# Patient Record
Sex: Female | Born: 1970 | Race: White | Hispanic: No | Smoking: Current every day smoker
Health system: Southern US, Community
[De-identification: ages and names within clinical notes are randomized; demographics above are authoritative.]

## PROBLEM LIST (undated history)

## (undated) ENCOUNTER — Ambulatory Visit

## (undated) ENCOUNTER — Encounter
Attending: Student in an Organized Health Care Education/Training Program | Primary: Student in an Organized Health Care Education/Training Program

## (undated) ENCOUNTER — Encounter

## (undated) ENCOUNTER — Telehealth

## (undated) ENCOUNTER — Ambulatory Visit: Payer: Medicare (Managed Care)

## (undated) ENCOUNTER — Ambulatory Visit: Attending: Pharmacist | Primary: Pharmacist

## (undated) ENCOUNTER — Emergency Department (HOSPITAL_COMMUNITY): Admission: EM | Payer: Medicaid Other | Source: Home / Self Care

## (undated) DIAGNOSIS — F32A Depression, unspecified: Secondary | ICD-10-CM

## (undated) DIAGNOSIS — R112 Nausea with vomiting, unspecified: Secondary | ICD-10-CM

## (undated) DIAGNOSIS — M549 Dorsalgia, unspecified: Secondary | ICD-10-CM

## (undated) DIAGNOSIS — Z9889 Other specified postprocedural states: Secondary | ICD-10-CM

## (undated) DIAGNOSIS — F419 Anxiety disorder, unspecified: Secondary | ICD-10-CM

## (undated) DIAGNOSIS — F329 Major depressive disorder, single episode, unspecified: Secondary | ICD-10-CM

## (undated) HISTORY — DX: Depression, unspecified: F32.A

## (undated) HISTORY — DX: Major depressive disorder, single episode, unspecified: F32.9

## (undated) HISTORY — DX: Anxiety disorder, unspecified: F41.9

## (undated) HISTORY — PX: BLADDER SURGERY: SHX569

## (undated) HISTORY — PX: OTHER SURGICAL HISTORY: SHX169

## (undated) HISTORY — PX: ABDOMINAL HYSTERECTOMY: SHX81

## (undated) HISTORY — DX: Dorsalgia, unspecified: M54.9

---

## 1997-10-24 ENCOUNTER — Inpatient Hospital Stay (HOSPITAL_COMMUNITY): Admission: AD | Admit: 1997-10-24 | Discharge: 1997-10-24 | Payer: Self-pay | Admitting: Obstetrics

## 1998-03-25 ENCOUNTER — Inpatient Hospital Stay (HOSPITAL_COMMUNITY): Admission: AD | Admit: 1998-03-25 | Discharge: 1998-03-25 | Payer: Self-pay | Admitting: Obstetrics

## 1998-03-26 ENCOUNTER — Inpatient Hospital Stay (HOSPITAL_COMMUNITY): Admission: RE | Admit: 1998-03-26 | Discharge: 1998-03-26 | Payer: Self-pay | Admitting: Obstetrics

## 1998-03-29 ENCOUNTER — Encounter: Payer: Self-pay | Admitting: Obstetrics

## 1998-03-29 ENCOUNTER — Ambulatory Visit (HOSPITAL_COMMUNITY): Admission: RE | Admit: 1998-03-29 | Discharge: 1998-03-29 | Payer: Self-pay | Admitting: Obstetrics

## 1998-04-01 ENCOUNTER — Ambulatory Visit (HOSPITAL_COMMUNITY): Admission: RE | Admit: 1998-04-01 | Discharge: 1998-04-01 | Payer: Self-pay | Admitting: Obstetrics

## 1998-08-25 ENCOUNTER — Inpatient Hospital Stay (HOSPITAL_COMMUNITY): Admission: AD | Admit: 1998-08-25 | Discharge: 1998-08-25 | Payer: Self-pay | Admitting: Obstetrics

## 1998-09-05 ENCOUNTER — Ambulatory Visit (HOSPITAL_COMMUNITY): Admission: RE | Admit: 1998-09-05 | Discharge: 1998-09-05 | Payer: Self-pay | Admitting: Obstetrics

## 1998-10-14 ENCOUNTER — Inpatient Hospital Stay (HOSPITAL_COMMUNITY): Admission: AD | Admit: 1998-10-14 | Discharge: 1998-10-14 | Payer: Self-pay | Admitting: Obstetrics

## 1998-10-24 ENCOUNTER — Inpatient Hospital Stay (HOSPITAL_COMMUNITY): Admission: AD | Admit: 1998-10-24 | Discharge: 1998-10-24 | Payer: Self-pay | Admitting: Obstetrics

## 1998-11-12 ENCOUNTER — Encounter (HOSPITAL_COMMUNITY): Admission: RE | Admit: 1998-11-12 | Discharge: 1998-11-25 | Payer: Self-pay | Admitting: Obstetrics

## 1998-11-23 ENCOUNTER — Inpatient Hospital Stay (HOSPITAL_COMMUNITY): Admission: AD | Admit: 1998-11-23 | Discharge: 1998-11-26 | Payer: Self-pay | Admitting: Obstetrics

## 2000-03-10 ENCOUNTER — Inpatient Hospital Stay (HOSPITAL_COMMUNITY): Admission: EM | Admit: 2000-03-10 | Discharge: 2000-03-10 | Payer: Self-pay | Admitting: Obstetrics

## 2000-03-18 ENCOUNTER — Other Ambulatory Visit: Admission: RE | Admit: 2000-03-18 | Discharge: 2000-03-18 | Payer: Self-pay | Admitting: Obstetrics & Gynecology

## 2000-05-07 ENCOUNTER — Other Ambulatory Visit: Admission: RE | Admit: 2000-05-07 | Discharge: 2000-05-07 | Payer: Self-pay | Admitting: Obstetrics & Gynecology

## 2000-05-07 ENCOUNTER — Encounter (INDEPENDENT_AMBULATORY_CARE_PROVIDER_SITE_OTHER): Payer: Self-pay

## 2000-07-05 ENCOUNTER — Ambulatory Visit (HOSPITAL_COMMUNITY): Admission: RE | Admit: 2000-07-05 | Discharge: 2000-07-05 | Payer: Self-pay | Admitting: *Deleted

## 2000-07-05 ENCOUNTER — Encounter (INDEPENDENT_AMBULATORY_CARE_PROVIDER_SITE_OTHER): Payer: Self-pay | Admitting: Specialist

## 2001-02-02 ENCOUNTER — Inpatient Hospital Stay (HOSPITAL_COMMUNITY): Admission: AD | Admit: 2001-02-02 | Discharge: 2001-02-02 | Payer: Self-pay | Admitting: *Deleted

## 2001-04-12 ENCOUNTER — Emergency Department (HOSPITAL_COMMUNITY): Admission: EM | Admit: 2001-04-12 | Discharge: 2001-04-12 | Payer: Self-pay

## 2001-04-12 ENCOUNTER — Encounter: Payer: Self-pay | Admitting: Emergency Medicine

## 2001-06-09 ENCOUNTER — Inpatient Hospital Stay (HOSPITAL_COMMUNITY): Admission: AD | Admit: 2001-06-09 | Discharge: 2001-06-09 | Payer: Self-pay | Admitting: Obstetrics & Gynecology

## 2001-08-07 ENCOUNTER — Inpatient Hospital Stay (HOSPITAL_COMMUNITY): Admission: AD | Admit: 2001-08-07 | Discharge: 2001-08-07 | Payer: Self-pay | Admitting: Obstetrics & Gynecology

## 2001-08-25 ENCOUNTER — Encounter: Admission: RE | Admit: 2001-08-25 | Discharge: 2001-08-25 | Payer: Self-pay | Admitting: Obstetrics

## 2001-09-30 ENCOUNTER — Encounter: Admission: RE | Admit: 2001-09-30 | Discharge: 2001-09-30 | Payer: Self-pay | Admitting: *Deleted

## 2002-05-29 ENCOUNTER — Inpatient Hospital Stay (HOSPITAL_COMMUNITY): Admission: AD | Admit: 2002-05-29 | Discharge: 2002-05-29 | Payer: Self-pay | Admitting: Obstetrics and Gynecology

## 2002-11-07 ENCOUNTER — Ambulatory Visit (HOSPITAL_COMMUNITY): Admission: RE | Admit: 2002-11-07 | Discharge: 2002-11-07 | Payer: Self-pay | Admitting: Nephrology

## 2002-11-07 ENCOUNTER — Encounter: Payer: Self-pay | Admitting: Nephrology

## 2002-12-17 ENCOUNTER — Emergency Department (HOSPITAL_COMMUNITY): Admission: EM | Admit: 2002-12-17 | Discharge: 2002-12-17 | Payer: Self-pay | Admitting: Emergency Medicine

## 2003-10-12 ENCOUNTER — Inpatient Hospital Stay (HOSPITAL_COMMUNITY): Admission: AD | Admit: 2003-10-12 | Discharge: 2003-10-12 | Payer: Self-pay | Admitting: *Deleted

## 2004-08-13 ENCOUNTER — Encounter (INDEPENDENT_AMBULATORY_CARE_PROVIDER_SITE_OTHER): Payer: Self-pay | Admitting: *Deleted

## 2004-08-13 ENCOUNTER — Ambulatory Visit (HOSPITAL_COMMUNITY): Admission: RE | Admit: 2004-08-13 | Discharge: 2004-08-13 | Payer: Self-pay | Admitting: Obstetrics

## 2004-08-29 ENCOUNTER — Encounter: Admission: RE | Admit: 2004-08-29 | Discharge: 2004-10-01 | Payer: Self-pay | Admitting: Orthopedic Surgery

## 2004-10-21 ENCOUNTER — Emergency Department (HOSPITAL_COMMUNITY): Admission: EM | Admit: 2004-10-21 | Discharge: 2004-10-21 | Payer: Self-pay | Admitting: Emergency Medicine

## 2005-02-16 ENCOUNTER — Emergency Department (HOSPITAL_COMMUNITY): Admission: EM | Admit: 2005-02-16 | Discharge: 2005-02-17 | Payer: Self-pay | Admitting: Emergency Medicine

## 2005-05-03 ENCOUNTER — Inpatient Hospital Stay (HOSPITAL_COMMUNITY): Admission: AD | Admit: 2005-05-03 | Discharge: 2005-05-04 | Payer: Self-pay | Admitting: Obstetrics

## 2005-06-24 ENCOUNTER — Inpatient Hospital Stay (HOSPITAL_COMMUNITY): Admission: AD | Admit: 2005-06-24 | Discharge: 2005-06-24 | Payer: Self-pay | Admitting: Obstetrics & Gynecology

## 2005-11-05 ENCOUNTER — Emergency Department (HOSPITAL_COMMUNITY): Admission: EM | Admit: 2005-11-05 | Discharge: 2005-11-05 | Payer: Self-pay | Admitting: *Deleted

## 2005-11-19 ENCOUNTER — Emergency Department (HOSPITAL_COMMUNITY): Admission: EM | Admit: 2005-11-19 | Discharge: 2005-11-19 | Payer: Self-pay | Admitting: Family Medicine

## 2006-01-03 ENCOUNTER — Emergency Department (HOSPITAL_COMMUNITY): Admission: EM | Admit: 2006-01-03 | Discharge: 2006-01-03 | Payer: Self-pay | Admitting: Emergency Medicine

## 2006-01-14 ENCOUNTER — Encounter: Admission: RE | Admit: 2006-01-14 | Discharge: 2006-01-14 | Payer: Self-pay | Admitting: Nephrology

## 2006-04-19 ENCOUNTER — Emergency Department (HOSPITAL_COMMUNITY): Admission: EM | Admit: 2006-04-19 | Discharge: 2006-04-19 | Payer: Self-pay | Admitting: Emergency Medicine

## 2006-04-24 ENCOUNTER — Emergency Department (HOSPITAL_COMMUNITY): Admission: EM | Admit: 2006-04-24 | Discharge: 2006-04-24 | Payer: Self-pay | Admitting: Family Medicine

## 2006-08-17 ENCOUNTER — Inpatient Hospital Stay (HOSPITAL_COMMUNITY): Admission: AD | Admit: 2006-08-17 | Discharge: 2006-08-18 | Payer: Self-pay | Admitting: Obstetrics

## 2006-12-13 ENCOUNTER — Emergency Department (HOSPITAL_COMMUNITY): Admission: EM | Admit: 2006-12-13 | Discharge: 2006-12-13 | Payer: Self-pay | Admitting: Emergency Medicine

## 2007-02-04 ENCOUNTER — Emergency Department (HOSPITAL_COMMUNITY): Admission: EM | Admit: 2007-02-04 | Discharge: 2007-02-04 | Payer: Self-pay | Admitting: Emergency Medicine

## 2007-03-07 ENCOUNTER — Emergency Department (HOSPITAL_COMMUNITY): Admission: EM | Admit: 2007-03-07 | Discharge: 2007-03-07 | Payer: Self-pay | Admitting: Emergency Medicine

## 2007-05-06 ENCOUNTER — Inpatient Hospital Stay (HOSPITAL_COMMUNITY): Admission: AD | Admit: 2007-05-06 | Discharge: 2007-05-06 | Payer: Self-pay | Admitting: Gynecology

## 2007-06-03 ENCOUNTER — Emergency Department (HOSPITAL_COMMUNITY): Admission: EM | Admit: 2007-06-03 | Discharge: 2007-06-03 | Payer: Self-pay | Admitting: Emergency Medicine

## 2007-08-04 HISTORY — PX: HAND SURGERY: SHX662

## 2007-08-07 ENCOUNTER — Inpatient Hospital Stay (HOSPITAL_COMMUNITY): Admission: AD | Admit: 2007-08-07 | Discharge: 2007-08-07 | Payer: Self-pay | Admitting: Obstetrics and Gynecology

## 2007-12-19 ENCOUNTER — Emergency Department (HOSPITAL_COMMUNITY): Admission: EM | Admit: 2007-12-19 | Discharge: 2007-12-19 | Payer: Self-pay | Admitting: Emergency Medicine

## 2008-12-14 ENCOUNTER — Emergency Department (HOSPITAL_COMMUNITY): Admission: EM | Admit: 2008-12-14 | Discharge: 2008-12-14 | Payer: Self-pay | Admitting: Emergency Medicine

## 2009-02-26 ENCOUNTER — Emergency Department (HOSPITAL_COMMUNITY): Admission: EM | Admit: 2009-02-26 | Discharge: 2009-02-26 | Payer: Self-pay | Admitting: Emergency Medicine

## 2009-05-10 ENCOUNTER — Emergency Department (HOSPITAL_BASED_OUTPATIENT_CLINIC_OR_DEPARTMENT_OTHER): Admission: EM | Admit: 2009-05-10 | Discharge: 2009-05-10 | Payer: Self-pay | Admitting: Emergency Medicine

## 2009-05-10 ENCOUNTER — Ambulatory Visit: Payer: Self-pay | Admitting: Diagnostic Radiology

## 2010-04-04 ENCOUNTER — Emergency Department (HOSPITAL_COMMUNITY): Admission: EM | Admit: 2010-04-04 | Discharge: 2010-04-04 | Payer: Self-pay | Admitting: Family Medicine

## 2010-04-16 ENCOUNTER — Ambulatory Visit: Payer: Self-pay | Admitting: Gynecology

## 2010-04-22 ENCOUNTER — Ambulatory Visit: Payer: Self-pay | Admitting: Gynecology

## 2010-07-11 ENCOUNTER — Ambulatory Visit: Payer: Self-pay | Admitting: Gynecology

## 2010-07-17 ENCOUNTER — Ambulatory Visit: Payer: Self-pay | Admitting: Gynecology

## 2010-07-17 ENCOUNTER — Ambulatory Visit
Admission: RE | Admit: 2010-07-17 | Discharge: 2010-07-18 | Payer: Self-pay | Source: Home / Self Care | Attending: Gynecology | Admitting: Gynecology

## 2010-08-04 ENCOUNTER — Ambulatory Visit: Payer: Self-pay | Admitting: Gynecology

## 2010-08-19 ENCOUNTER — Ambulatory Visit
Admission: RE | Admit: 2010-08-19 | Discharge: 2010-08-19 | Payer: Self-pay | Source: Home / Self Care | Attending: Gynecology | Admitting: Gynecology

## 2010-10-13 ENCOUNTER — Emergency Department (HOSPITAL_COMMUNITY)
Admission: EM | Admit: 2010-10-13 | Discharge: 2010-10-13 | Disposition: A | Discharge status: Home / Self Care | Payer: Medicaid Other | Attending: Emergency Medicine | Admitting: Emergency Medicine

## 2010-10-13 DIAGNOSIS — R51 Headache: Secondary | ICD-10-CM | POA: Insufficient documentation

## 2010-10-13 DIAGNOSIS — H538 Other visual disturbances: Secondary | ICD-10-CM | POA: Insufficient documentation

## 2010-10-13 DIAGNOSIS — R42 Dizziness and giddiness: Secondary | ICD-10-CM | POA: Insufficient documentation

## 2010-10-13 LAB — APTT: aPTT: 29 seconds (ref 24–37)

## 2010-10-13 LAB — ABO/RH: ABO/RH(D): A POS

## 2010-10-13 LAB — DIFFERENTIAL
Basophils Absolute: 0 10*3/uL (ref 0.0–0.1)
Basophils Relative: 0 % (ref 0–1)
Lymphocytes Relative: 24 % (ref 12–46)
Neutro Abs: 8.6 10*3/uL — ABNORMAL HIGH (ref 1.7–7.7)
Neutrophils Relative %: 66 % (ref 43–77)

## 2010-10-13 LAB — CBC
Platelets: 186 10*3/uL (ref 150–400)
RDW: 12.6 % (ref 11.5–15.5)
WBC: 13 10*3/uL — ABNORMAL HIGH (ref 4.0–10.5)

## 2010-10-13 LAB — PROTIME-INR
INR: 0.97 (ref 0.00–1.49)
Prothrombin Time: 13.1 seconds (ref 11.6–15.2)

## 2010-10-16 LAB — WET PREP, GENITAL
Clue Cells Wet Prep HPF POC: NONE SEEN
Yeast Wet Prep HPF POC: NONE SEEN

## 2010-10-16 LAB — POCT URINALYSIS DIPSTICK
Protein, ur: NEGATIVE mg/dL
Urobilinogen, UA: 1 mg/dL (ref 0.0–1.0)
pH: 6 (ref 5.0–8.0)

## 2010-10-16 LAB — GC/CHLAMYDIA PROBE AMP, GENITAL

## 2010-12-19 NOTE — Op Note (Signed)
[facility]  Patient:    Bailey Mcdaniel, DATE                     MRN: [id number] Proc. Date: 07/05/00 Adm. Date:  [id number] Attending:  Pleas Koch                           Operative Report  PREOPERATIVE DIAGNOSES:       1. Chronic right lower quadrant pain.                               2. Desires elective sterilization.                               3. Cervical dysplasia.  POSTOPERATIVE DIAGNOSES:      1. Chronic right lower quadrant pain.                               2. Desires elective sterilization.                               3. Cervical dysplasia.                               4. Endometriosis, minimal, of right and left                                  ovary.  OPERATION:                    Loop electrosurgical excision procedure (LEEP),                               Bilateral tubal ligation by cautery,                               fulguration of right and left ovarian                               endometriosis.  SURGEON:                      Georgina Peer, M.D.  ASSISTANT:  ANESTHESIA:                   General anesthesia per Ellison Hughs., M.D.  ESTIMATED BLOOD LOSS:         Less than 25 cc.  COMPLICATIONS:                None.  FINDINGS:                     Posterior cervical lip lesion as previously seen on colposcopy in the office.  Minimal ovarian endometriosis bilaterally.  No evidence of adhesions or other pathology.  INDICATIONS:                  This is a 40 year old gravida 3, para 3 with chronic right lower quadrant pain, negative cultures, normal pelvic examination except for tenderness.  She had an abnormal Pap smear and colposcopy showed mild cervical dysplasia on the posterior lip of the cervix. She comes in for treatment and also elects sterilization.  She was aware of the risks and complications of LEEP procedure and also laparoscopy, and also the one to two  percent lifetime failure rate of tubal ligation. She was willing to proceed.  DESCRIPTION OF PROCEDURE:     The patient was taken to the operating room and given a general anesthetic, placed in the dorsal lithotomy position with the legs in Alan stirrups.  Abdomen and perineum were prepped and draped sterilely.  Bladder was emptied with a catheter.  Examination under anesthesia revealed normal mid position uterus with no adnexal masses.  Speculum was placed which was especially coated for LEEP procedure.  Cervix was bathed with acetic acid, injected with a total of 7 cc of 0.5% Xylocaine with epinephrine 1:100,000.  The cervix blanched slightly.  Lugol iodine was applied in a posterior lesion as previously noted in the office was seen.  A white handled LEEP electrode was used to excise the posterior lip and anterior lip which were then appropriately marked on a cork board and sent to pathology.  Any bleeders were cauterized with red ball coagulation.  A Hulka uterine manipulator was then placed after Monsels was applied to the LEEP crator.  A vertical subumbilical incision was then made. Verres needle placed and 3.5 liters of carbon dioxide gas insufflated creating a pneumoperitoneum. Laparoscopic trocar and sleeve were introduced and under direct vision a 5 mm trocar was introduced.  The following pelvic findings were noted.  There appeared to be no injury from laparoscopic trocar placements.  There was no evidence of tubo-ovarian adhesions. The tubes appeared normal.  The appendix appeared normal. The cul-de-sac appeared normal.  There was evidence of powder burn spots on the ovary surfaces bilaterally with no adhesions. The ovarian fossae bilaterally appeared normal. The ureters appeared normal.  The bladder flap appeared normal.  The upper abdomen appeared normal.  Photo documentation of this was accomplished. Using bipolar cautery the tubes bilaterally were grasped in their mid  portion, cauterized until bubbling, blanching and no current passed the tubes along the 2 cm portions.  Again, this was done bilaterally. Cauterization with bipolar current was also accomplished to fulgurate the surface endometriosis on the ovaries bilaterally. There was no bleeding.  Irrigation removed any blood and debris.  The procedure was then terminated with the ports removed.  Marcaine, a total of 3 cc, placed in the skin incisions.  The incisions were then closed with subcuticular Dexon. The vaginal instruments were then removed.  Sponge, needle and instrument counts were correct.  The patient received IV Toradol and was awakened and sent to the recovery room in stable condition. DD:  07/05/00 TD:  07/05/00 Job: [id number]

## 2010-12-19 NOTE — Op Note (Signed)
Bailey Mcdaniel, Bailey Mcdaniel              ACCOUNT NO.:  [id number]   MEDICAL RECORD NO.:  [id number]          PATIENT TYPE:  AMB   LOCATION:  SDC                           FACILITY:  [facility]   PHYSICIAN:  Kathreen Cosier, M.D.DATE OF BIRTH:  Sep 20, 1970   DATE OF PROCEDURE:  08/13/2004  DATE OF DISCHARGE:                                 OPERATIVE REPORT   PREOPERATIVE DIAGNOSIS:  Dysfunctional uterine bleeding, hypermenorrhea.   PROCEDURE:  Hysteroscopy, dilation and curettage, and NovaSure ablation.   DESCRIPTION OF PROCEDURE:  Under general anesthesia, perineum and vagina  were prepped and draped.  Bladder entered with a straight catheter.  Bimanual exam revealed the uterus to be normal size, negative adnexa.  Speculum placed in the vagina and the cervix curetted.  Endometrial cavity  was sounded to 11 cm and, then, the cervical length was measured with the  Hegar dilator to be 5 cm.  So, the cavity length was 6 cm.  The cervix  dilated with a #27 Shawnie Pons and the diagnostic hysteroscope inserted.  The  cavity was normal.  Sharp curettage was performed.  Monomorphic tissue  obtained.  The NovaSure device was inserted and the cavity integrity could  not be established at first because the gas was leaking through a badly  deformed posterior cervix.  So, a #1 chromic suture was placed around the  cervix and tied and cavity integrity was established.  Cavity width was  noted to be 3 cm.  NovaSure ablation was done at 99 watts for 76 seconds.  Then, the NovaSure device was removed and hysteroscopy repeated.  Cavity was  totally ablated.  The suture from the cervix was removed.  Fluid deficit was  40 cubic centimeters.  The patient tolerated the procedure well.      BAM/MEDQ  D:  08/13/2004  T:  08/13/2004  Job:  [id number]

## 2011-02-03 ENCOUNTER — Emergency Department (HOSPITAL_COMMUNITY)
Admission: EM | Admit: 2011-02-03 | Discharge: 2011-02-03 | Disposition: A | Discharge status: Home / Self Care | Payer: BC Managed Care – PPO | Attending: Emergency Medicine | Admitting: Emergency Medicine

## 2011-02-03 DIAGNOSIS — M545 Low back pain, unspecified: Secondary | ICD-10-CM | POA: Insufficient documentation

## 2011-02-03 DIAGNOSIS — S335XXA Sprain of ligaments of lumbar spine, initial encounter: Secondary | ICD-10-CM | POA: Insufficient documentation

## 2011-02-03 DIAGNOSIS — X500XXA Overexertion from strenuous movement or load, initial encounter: Secondary | ICD-10-CM | POA: Insufficient documentation

## 2011-02-03 DIAGNOSIS — Y990 Civilian activity done for income or pay: Secondary | ICD-10-CM | POA: Insufficient documentation

## 2011-03-16 ENCOUNTER — Emergency Department (HOSPITAL_COMMUNITY)
Admission: EM | Admit: 2011-03-16 | Discharge: 2011-03-16 | Disposition: A | Discharge status: Home / Self Care | Payer: BC Managed Care – PPO | Attending: Emergency Medicine | Admitting: Emergency Medicine

## 2011-03-16 DIAGNOSIS — T63481A Toxic effect of venom of other arthropod, accidental (unintentional), initial encounter: Secondary | ICD-10-CM | POA: Insufficient documentation

## 2011-03-16 DIAGNOSIS — T6391XA Toxic effect of contact with unspecified venomous animal, accidental (unintentional), initial encounter: Secondary | ICD-10-CM | POA: Insufficient documentation

## 2011-03-16 DIAGNOSIS — B850 Pediculosis due to Pediculus humanus capitis: Secondary | ICD-10-CM | POA: Insufficient documentation

## 2011-03-20 ENCOUNTER — Other Ambulatory Visit: Payer: Self-pay | Admitting: Orthopedic Surgery

## 2011-03-20 ENCOUNTER — Encounter (HOSPITAL_COMMUNITY): Payer: BC Managed Care – PPO

## 2011-03-20 ENCOUNTER — Other Ambulatory Visit (HOSPITAL_COMMUNITY): Payer: Self-pay | Admitting: Orthopedic Surgery

## 2011-03-20 ENCOUNTER — Ambulatory Visit (HOSPITAL_COMMUNITY)
Admission: RE | Admit: 2011-03-20 | Discharge: 2011-03-20 | Disposition: A | Discharge status: Home / Self Care | Payer: BC Managed Care – PPO | Source: Ambulatory Visit | Attending: Orthopedic Surgery | Admitting: Orthopedic Surgery

## 2011-03-20 DIAGNOSIS — M5137 Other intervertebral disc degeneration, lumbosacral region: Secondary | ICD-10-CM | POA: Insufficient documentation

## 2011-03-20 DIAGNOSIS — M79609 Pain in unspecified limb: Secondary | ICD-10-CM | POA: Insufficient documentation

## 2011-03-20 DIAGNOSIS — Z01812 Encounter for preprocedural laboratory examination: Secondary | ICD-10-CM | POA: Insufficient documentation

## 2011-03-20 DIAGNOSIS — Z01818 Encounter for other preprocedural examination: Secondary | ICD-10-CM

## 2011-03-20 DIAGNOSIS — IMO0002 Reserved for concepts with insufficient information to code with codable children: Secondary | ICD-10-CM | POA: Insufficient documentation

## 2011-03-20 DIAGNOSIS — M51379 Other intervertebral disc degeneration, lumbosacral region without mention of lumbar back pain or lower extremity pain: Secondary | ICD-10-CM | POA: Insufficient documentation

## 2011-03-20 LAB — CBC
HCT: 44 % (ref 36.0–46.0)
Hemoglobin: 14.7 g/dL (ref 12.0–15.0)
MCH: 30.9 pg (ref 26.0–34.0)
MCHC: 33.4 g/dL (ref 30.0–36.0)
RDW: 12.7 % (ref 11.5–15.5)

## 2011-03-20 LAB — URINALYSIS, ROUTINE W REFLEX MICROSCOPIC
Glucose, UA: NEGATIVE mg/dL
Hgb urine dipstick: NEGATIVE
Ketones, ur: NEGATIVE mg/dL
Protein, ur: NEGATIVE mg/dL
Urobilinogen, UA: 0.2 mg/dL (ref 0.0–1.0)

## 2011-03-20 LAB — COMPREHENSIVE METABOLIC PANEL
ALT: 17 U/L (ref 0–35)
Alkaline Phosphatase: 62 U/L (ref 39–117)
BUN: 9 mg/dL (ref 6–23)
CO2: 29 mEq/L (ref 19–32)
Chloride: 102 mEq/L (ref 96–112)
GFR calc Af Amer: >60 mL/min (ref >60)
GFR calc non Af Amer: >60 mL/min (ref >60)
Glucose, Bld: 62 mg/dL — ABNORMAL LOW (ref 70–99)
Potassium: 3.9 mEq/L (ref 3.5–5.1)
Sodium: 138 mEq/L (ref 135–145)
Total Bilirubin: 0.3 mg/dL (ref 0.3–1.2)
Total Protein: 7 g/dL (ref 6.0–8.3)

## 2011-03-20 LAB — PROTIME-INR: Prothrombin Time: 13.1 seconds (ref 11.6–15.2)

## 2011-03-20 LAB — DIFFERENTIAL
Basophils Absolute: 0.1 10*3/uL (ref 0.0–0.1)
Basophils Relative: 1 % (ref 0–1)
Eosinophils Relative: 2 % (ref 0–5)
Lymphocytes Relative: 20 % (ref 12–46)
Monocytes Absolute: 1 10*3/uL (ref 0.1–1.0)
Monocytes Relative: 10 % (ref 3–12)

## 2011-03-20 LAB — SURGICAL PCR SCREEN: MRSA, PCR: INVALID — AB

## 2011-03-23 LAB — MRSA CULTURE

## 2011-03-27 ENCOUNTER — Ambulatory Visit (HOSPITAL_COMMUNITY): Payer: BC Managed Care – PPO

## 2011-03-27 ENCOUNTER — Other Ambulatory Visit: Payer: Self-pay | Admitting: Orthopedic Surgery

## 2011-03-27 ENCOUNTER — Ambulatory Visit (HOSPITAL_COMMUNITY)
Admission: RE | Admit: 2011-03-27 | Discharge: 2011-03-29 | Disposition: A | Discharge status: Home / Self Care | Payer: BC Managed Care – PPO | Source: Ambulatory Visit | Attending: Orthopedic Surgery | Admitting: Orthopedic Surgery

## 2011-03-27 DIAGNOSIS — Z01812 Encounter for preprocedural laboratory examination: Secondary | ICD-10-CM | POA: Insufficient documentation

## 2011-03-27 DIAGNOSIS — Z01818 Encounter for other preprocedural examination: Secondary | ICD-10-CM | POA: Insufficient documentation

## 2011-03-27 DIAGNOSIS — M79609 Pain in unspecified limb: Secondary | ICD-10-CM | POA: Insufficient documentation

## 2011-03-27 DIAGNOSIS — M5126 Other intervertebral disc displacement, lumbar region: Secondary | ICD-10-CM | POA: Insufficient documentation

## 2011-03-27 DIAGNOSIS — F17200 Nicotine dependence, unspecified, uncomplicated: Secondary | ICD-10-CM | POA: Insufficient documentation

## 2011-03-27 LAB — TYPE AND SCREEN
ABO/RH(D): A POS
Antibody Screen: NEGATIVE

## 2011-03-28 HISTORY — PX: BACK SURGERY: SHX140

## 2011-03-28 NOTE — Op Note (Signed)
  NAMEJAQUITTA, Bailey Mcdaniel NO.:  [id number]  MEDICAL RECORD NO.:  [id number]  LOCATION:  1620                         FACILITY:  [facility]  PHYSICIAN:  Georges Lynch. Aniylah Avans, M.D.DATE OF BIRTH:  26-Jun-1971  DATE OF PROCEDURE:  03/27/2011 DATE OF DISCHARGE:                              OPERATIVE REPORT   SURGEON:  Georges Lynch. 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  OPERATION: 1. Hemilaminectomy, microscopic microdiskectomy at L5-S1. 2. Foraminotomy for the S1 root at L5-S1 on the left.  PROCEDURE IN DETAIL:  Under general anesthesia, routine orthopedic prepping and draping of the back was carried out with the patient on spinal frame.  She first had 1 g of IV Ancef.  The appropriate time-out was carried out prior to making the incision or inserting the needles. In the holding area, I did mark the appropriate left side of the back. An incision then was made over the lower lumbar region after an x-ray was taken to verify our position.  I then separated the muscle from the lamina and spinous processes.  Self-retaining retractor were inserted and another x-ray was taken.  Following that, I stripped the muscle from the lamina and spinous processes as mentioned.  I inserted McCullough retractors and I went down and carried out my hemilaminectomy at L5-S1 in the usual fashion.  I then carried out a nice foraminotomy for the S1 root on the left.  I then gently removed the ligamentum flavum.  The dura was identified and microscope was used. At this time, I identified the posterior longitudinal ligament.  I cauterized lateral recess veins with bipolar.  The dura and nerve root were nicely isolated.  We had a large herniated disk.  I made a cruciate  incision in the posterior longitudinal ligament, did a complete microdiskectomy.  I utilized a nerve hook and the Epstein curettes to free up the disk material that was subligamentous.  Following that, we made multiple passes into the disk space.  We also made sure there were no other loose fragments noted.  We were able to easily pass a hockey-stick out the foramina above and below for the 5 and the S1 root.  The dura now was freely movable.  I thoroughly irrigated out the area, loosely applied some thrombin-soaked Gelfoam, and closed the wound in layers in usual fashion except I left a small distal and proximal deep portion of the wound open for drainage purposes.  Subcutaneous was closed with 0 Vicryl, skin was closed with metal staples.  Sterile Neosporin bundle dressing was applied.  Note, an x-ray was taken with an instrument in the L5-S1 display.          ______________________________ Georges Lynch Bailey Mcdaniel, M.D.     RAG/MEDQ  D:  03/27/2011  T:  03/28/2011  Job:  [id number]  Electronically Signed by Ranee Gosselin M.D. on 03/28/2011 08:33:17 AM

## 2011-03-31 NOTE — Discharge Summary (Signed)
  NAMEKATHYANN, SPAUGH NO.:  [id number]  MEDICAL RECORD NO.:  [id number]  LOCATION:  1620                         FACILITY:  [facility]  PHYSICIAN:  Georges Lynch. Ilani Otterson, M.D.DATE OF BIRTH:  October 07, 1970  DATE OF ADMISSION:  03/27/2011 DATE OF DISCHARGE:  03/29/2011                              DISCHARGE SUMMARY   She was taken to surgery on March 27, 2011, and had a microdiskectomy for a large herniated lumbar disk, it was all on the left side, was more central onto the left.  Preop, the only complaint she had last several days has been left leg pain only, no right leg pain.  POSTOPERATIVE DIAGNOSIS:  Same.  SURGEON:  Georges Lynch. Darrelyn Hillock, M.D..  ASSISTANT:  Jene Every, M.D.  She did well.  I sent the specimen down to the lab.  First postop day on August 25, she was quite uncomfortable.  She was seen by my associate and I have elected to keep her in the hospital another day.  I saw her on March 29, 2011.  She was neurologically intact.  She said she had no left leg pain, minimal right leg pain now.  She has been stable.  I elected to discharge her.  I will see her back in the office in 2 weeks or prior if she has any problem.  I gave her full set of instructions.  LABORATORY STUDIES:  Her nasal screening showed moderate Staph aureus in her nose.  She was started on mupirocin at that time.  The white count on admission was 9.9, hemoglobin 14.7, hematocrit 44, the platelet count was 228.  Differential was normal.  Her sodium 138, potassium 3.9, chloride 109, glucose 62, BUN 9, creatinine 0.57, calcium 9.2, SGOT is 14, SGPT 17, alkaline phosphatase 62, bilirubin 0.3.  Her INR was 0.97, PT was 13.1, PTT was 31.  Urinalysis negative.  Her lumbar spine x-ray showed __________ lumbar vertebrae.  FINAL DISCHARGE DIAGNOSIS:  Large herniated lumbar disk central in the left with left leg pain only preop.  DISCHARGE CONDITION:  Improved.  DISCHARGE DIET:  Regular  diet.  DISCHARGE MEDICATIONS:  I did submit the discharge manager.  Beside that, she will be on Robaxin 500 mg by mouth t.i.d. p.r.n. for spasms and she will be on Percocet 10/650 one every 4 hours p.r.n. for pain.  DISCHARGE INSTRUCTIONS:  To ambulate with or without her walker as needed.  To see me in the office 2 weeks or prior to if she has any problem.  She will change her dressing daily.  If there are any issues or any problems, she will call and let me know.          ______________________________ Georges Lynch Darrelyn Hillock, M.D.     RAG/MEDQ  D:  03/29/2011  T:  03/29/2011  Job:  [id number]  Electronically Signed by Ranee Gosselin M.D. on 03/31/2011 08:47:15 AM

## 2011-04-10 ENCOUNTER — Emergency Department (HOSPITAL_COMMUNITY)
Admission: EM | Admit: 2011-04-10 | Discharge: 2011-04-10 | Disposition: A | Discharge status: Home / Self Care | Payer: BC Managed Care – PPO | Attending: Emergency Medicine | Admitting: Emergency Medicine

## 2011-04-10 DIAGNOSIS — B354 Tinea corporis: Secondary | ICD-10-CM | POA: Insufficient documentation

## 2011-04-10 DIAGNOSIS — R110 Nausea: Secondary | ICD-10-CM | POA: Insufficient documentation

## 2011-04-22 LAB — CBC
Hemoglobin: 13
RBC: 3.94
WBC: 10.2

## 2011-04-22 LAB — GC/CHLAMYDIA PROBE AMP, GENITAL
Chlamydia, DNA Probe: NEGATIVE
GC Probe Amp, Genital: NEGATIVE

## 2011-04-22 LAB — URINALYSIS, ROUTINE W REFLEX MICROSCOPIC
Nitrite: NEGATIVE
Specific Gravity, Urine: <1.005 — ABNORMAL LOW
Urobilinogen, UA: 0.2
pH: 6.5

## 2011-04-22 LAB — URINE MICROSCOPIC-ADD ON

## 2011-04-22 LAB — WET PREP, GENITAL

## 2011-05-13 LAB — URINALYSIS, ROUTINE W REFLEX MICROSCOPIC
Glucose, UA: NEGATIVE
Nitrite: NEGATIVE
Specific Gravity, Urine: 1.019
pH: 6.5

## 2011-05-13 LAB — DIFFERENTIAL
Basophils Absolute: 0
Basophils Relative: 0
Lymphocytes Relative: 13
Monocytes Absolute: 0.4
Monocytes Relative: 5
Neutro Abs: 6
Neutrophils Relative %: 82 — ABNORMAL HIGH

## 2011-05-13 LAB — CBC
Hemoglobin: 14.8
MCHC: 35.6
RBC: 4.57
RDW: 12.1

## 2011-05-13 LAB — BASIC METABOLIC PANEL
CO2: 22
Calcium: 8.4
Creatinine, Ser: 0.49
GFR calc Af Amer: >60
GFR calc non Af Amer: >60
Sodium: 134 — ABNORMAL LOW

## 2011-05-13 LAB — PREGNANCY, URINE: Preg Test, Ur: NEGATIVE

## 2011-05-13 LAB — HEPATIC FUNCTION PANEL
ALT: 37 — ABNORMAL HIGH
AST: 27
Albumin: 3.4 — ABNORMAL LOW
Bilirubin, Direct: 0.2

## 2011-05-13 LAB — URINE MICROSCOPIC-ADD ON

## 2011-06-18 ENCOUNTER — Encounter: Payer: Self-pay | Admitting: Internal Medicine

## 2011-06-18 ENCOUNTER — Ambulatory Visit (INDEPENDENT_AMBULATORY_CARE_PROVIDER_SITE_OTHER): Payer: BC Managed Care – PPO | Admitting: Internal Medicine

## 2011-06-18 VITALS — BP 114/70 | HR 98 | Temp 99.2°F | Ht 64.0 in | Wt 150.0 lb

## 2011-06-18 DIAGNOSIS — J069 Acute upper respiratory infection, unspecified: Secondary | ICD-10-CM

## 2011-06-18 DIAGNOSIS — H00019 Hordeolum externum unspecified eye, unspecified eyelid: Secondary | ICD-10-CM
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
## 2011-06-18 NOTE — Patient Instructions (Signed)
Rest, fluids , tylenol For cough, take Mucinex   twice a day as needed  For congestion use Sudafed (pseudoephedrine) behind the counter 30 mg every 4 to 6 hours as needed Call if no better in few days Call anytime if the symptoms are severe

## 2011-07-10 ENCOUNTER — Inpatient Hospital Stay (HOSPITAL_COMMUNITY)
Admission: AD | Admit: 2011-07-10 | Discharge: 2011-07-10 | Discharge status: Against Medical Advice | Payer: BC Managed Care – PPO | Source: Ambulatory Visit | Attending: Obstetrics & Gynecology | Admitting: Obstetrics & Gynecology

## 2011-07-10 NOTE — Plan of Care (Signed)
Patient is not in the lobby when called to triage.  

## 2011-07-10 NOTE — Plan of Care (Signed)
Patient is not in the lobby when called to triage for the third time.  

## 2011-08-26 ENCOUNTER — Encounter: Payer: Self-pay | Admitting: Internal Medicine

## 2011-08-26 ENCOUNTER — Ambulatory Visit (INDEPENDENT_AMBULATORY_CARE_PROVIDER_SITE_OTHER): Payer: BC Managed Care – PPO | Admitting: Internal Medicine

## 2011-08-26 VITALS — BP 112/80 | HR 85 | Temp 98.4°F | Resp 14 | Wt 153.5 lb

## 2011-08-26 DIAGNOSIS — F329 Major depressive disorder, single episode, unspecified: Secondary | ICD-10-CM | POA: Insufficient documentation

## 2011-08-26 DIAGNOSIS — M549 Dorsalgia, unspecified: Secondary | ICD-10-CM | POA: Insufficient documentation

## 2011-08-26 DIAGNOSIS — F341 Dysthymic disorder: Secondary | ICD-10-CM

## 2011-08-26 MED ORDER — CITALOPRAM HYDROBROMIDE 20 MG PO TABS: 20.0000 mg | ORAL_TABLET | Freq: Every day | ORAL | Status: DC

## 2011-08-26 NOTE — Assessment & Plan Note (Addendum)
status post back  surgery 03-2011, pain  is still there. Apparently no going back to the doctor who did her surgery, would like another opinion. Will refer   at her request to the [facility]

## 2011-08-26 NOTE — Patient Instructions (Signed)
Citalopram 20 mg: 1/2 tablet a day x 1 week, then 1 tablet a day x 1 week, then 1.5 tablets a day Came back in 4 to 5 weeks

## 2011-08-26 NOTE — Assessment & Plan Note (Signed)
Patient with symptoms consistent with SI compression, treatment modalities discussed and include: --Counseling for her children --Counseling for herself --Medication. She already went through counseling for her children will be very hard for her to afford counseling for herself. We will start day and citalopram, side effects discussed, see instructions
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
## 2011-08-27 ENCOUNTER — Encounter: Payer: Self-pay | Admitting: Internal Medicine

## 2012-03-08 ENCOUNTER — Other Ambulatory Visit: Payer: Self-pay | Admitting: Physical Medicine and Rehabilitation

## 2012-03-08 DIAGNOSIS — M5416 Radiculopathy, lumbar region: Secondary | ICD-10-CM

## 2012-03-29 ENCOUNTER — Ambulatory Visit
Admission: RE | Admit: 2012-03-29 | Discharge: 2012-03-29 | Disposition: A | Discharge status: Home / Self Care | Payer: BC Managed Care – PPO | Source: Ambulatory Visit | Attending: Physical Medicine and Rehabilitation | Admitting: Physical Medicine and Rehabilitation

## 2012-03-29 DIAGNOSIS — M5416 Radiculopathy, lumbar region: Secondary | ICD-10-CM

## 2012-10-06 IMAGING — CR DG SPINE 1V PORT
1 series · 1 of 1 positions shown · non-contrast
Comparison: 03/20/2011

CLINICAL DATA: Lumbar disc herniation at L5-S1.

LUMBAR SPINE - 1 VIEW

[view not recorded]
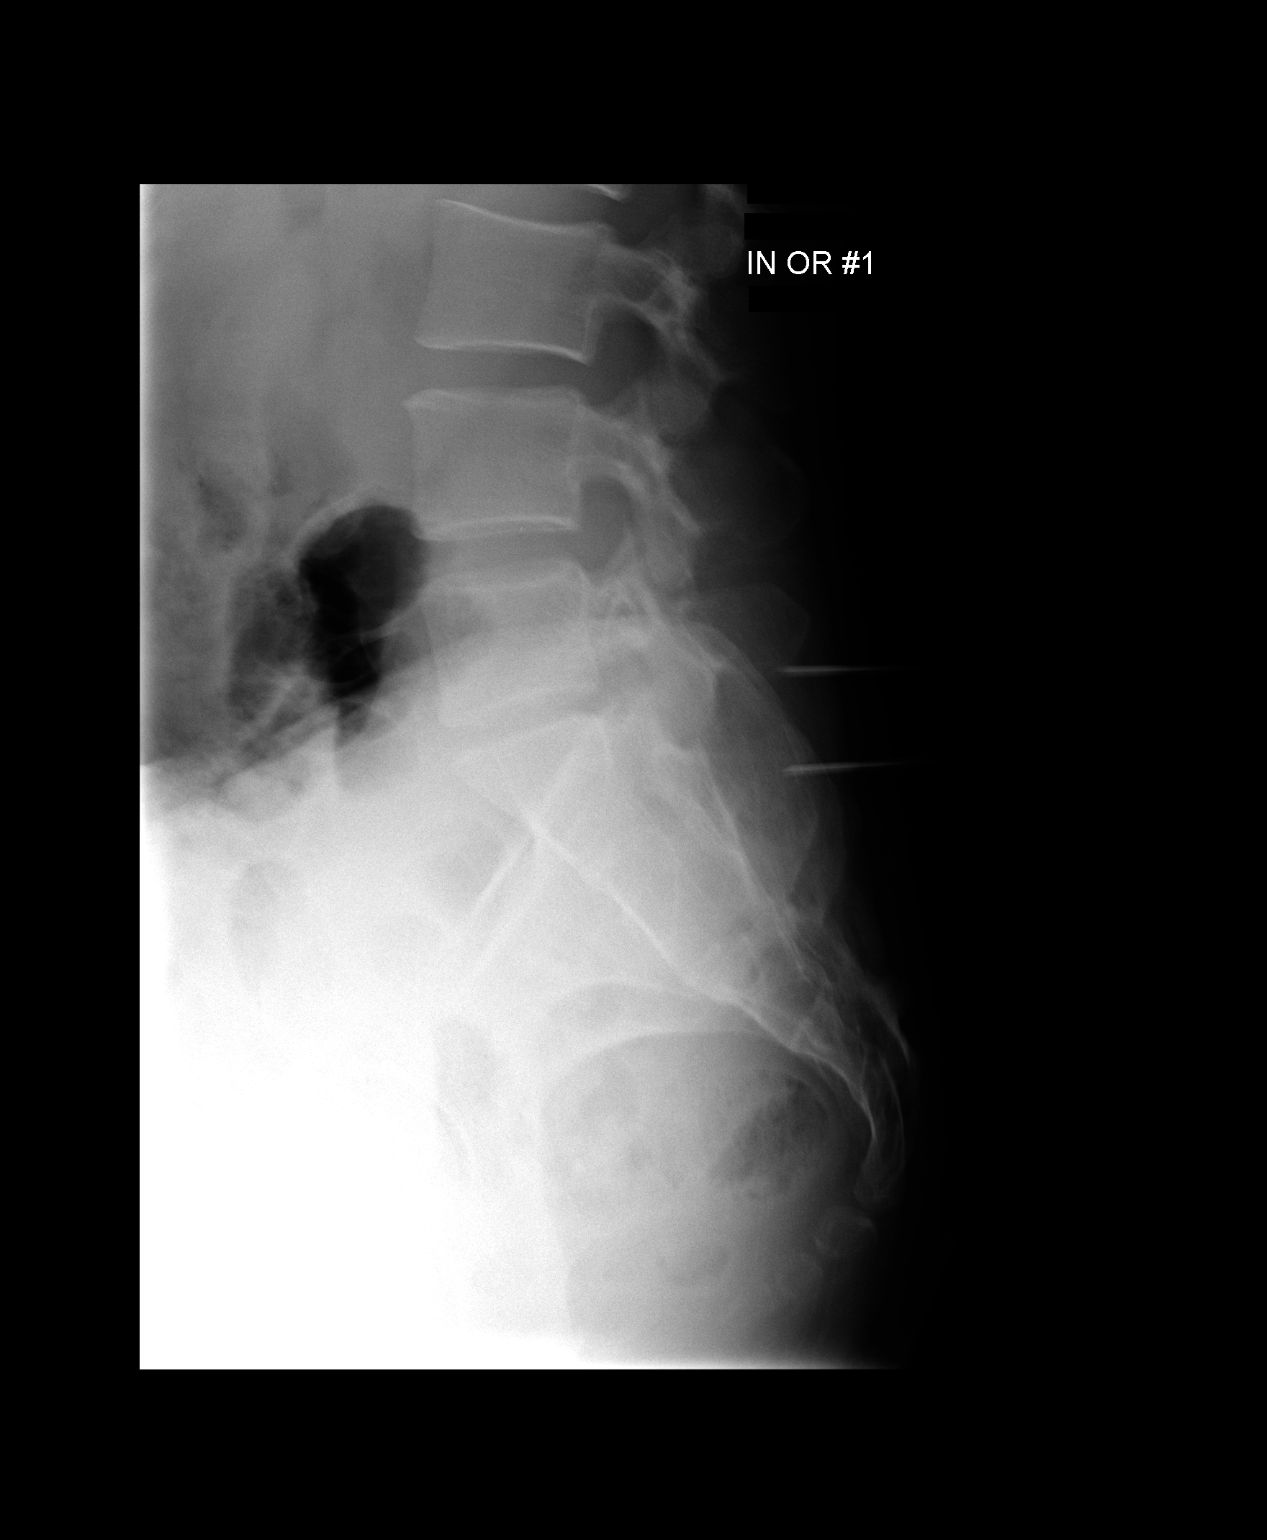

[1 of 1 positions shown; findings below may reference images not displayed]

FINDINGS: Cross-table lateral view obtained intraoperatively shows
two needles overlying the spinous processes of L5 and S1.
IMPRESSION: Intraoperative localization of spinous processes at L5 and S1.

## 2012-11-04 ENCOUNTER — Telehealth: Payer: Self-pay | Admitting: *Deleted

## 2012-11-04 NOTE — Telephone Encounter (Signed)
Agree , needs ER eval. Please call pt Monday if she does not keep her appointment

## 2012-11-04 NOTE — Telephone Encounter (Signed)
Pt called stating that she has been extremely depressed & very stressed lately. Pt states that Dr. Drue Novel previously prescribed her citalopram but that is not longer helping. Pt stated that she has "spazzed out" on both her kids & husband & she stated that was very unlike her. Pt also stated that she has had some thoughts of hurting herself. Per Dr. Drue Novel I advised her that she did need to go to the ER. Pt understood. Pt also scheduled an appt with Dr. Drue Novel on Monday @11 :15a.

## 2012-11-07 ENCOUNTER — Ambulatory Visit: Payer: BC Managed Care – PPO | Admitting: Internal Medicine

## 2012-11-07 ENCOUNTER — Ambulatory Visit (INDEPENDENT_AMBULATORY_CARE_PROVIDER_SITE_OTHER): Payer: Self-pay | Admitting: Internal Medicine

## 2012-11-07 ENCOUNTER — Encounter: Payer: Self-pay | Admitting: Internal Medicine

## 2012-11-07 VITALS — BP 124/84 | HR 82 | Wt 164.0 lb

## 2012-11-07 DIAGNOSIS — F419 Anxiety disorder, unspecified: Secondary | ICD-10-CM

## 2012-11-07 DIAGNOSIS — F329 Major depressive disorder, single episode, unspecified: Secondary | ICD-10-CM

## 2012-11-07 DIAGNOSIS — F341 Dysthymic disorder: Secondary | ICD-10-CM

## 2012-11-07 DIAGNOSIS — M549 Dorsalgia, unspecified: Secondary | ICD-10-CM

## 2012-11-07 MED ORDER — CITALOPRAM HYDROBROMIDE 40 MG PO TABS: 40.0000 mg | ORAL_TABLET | Freq: Every day | ORAL | Status: DC

## 2012-11-07 MED ORDER — CLONAZEPAM 0.5 MG PO TABS: 0.5000 mg | ORAL_TABLET | Freq: Two times a day (BID) | ORAL | Status: DC | PRN
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
## 2012-11-07 NOTE — Assessment & Plan Note (Signed)
Ongoing issue, taking about a second surgery

## 2012-11-07 NOTE — Telephone Encounter (Signed)
Pt came in for OV. 
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
## 2012-11-07 NOTE — Patient Instructions (Addendum)
See the counselor as you are planning Come back in 6 weeks, call sooner if problems

## 2012-12-19 ENCOUNTER — Encounter: Payer: Self-pay | Admitting: Lab

## 2012-12-20 ENCOUNTER — Ambulatory Visit: Payer: Self-pay | Admitting: Internal Medicine

## 2012-12-20 DIAGNOSIS — Z0289 Encounter for other administrative examinations: Secondary | ICD-10-CM

## 2013-01-26 ENCOUNTER — Emergency Department (HOSPITAL_COMMUNITY): Payer: Self-pay

## 2013-01-26 ENCOUNTER — Encounter (HOSPITAL_COMMUNITY): Payer: Self-pay | Admitting: Emergency Medicine

## 2013-01-26 ENCOUNTER — Emergency Department (HOSPITAL_COMMUNITY)
Admission: EM | Admit: 2013-01-26 | Discharge: 2013-01-26 | Disposition: A | Discharge status: Home / Self Care | Payer: Self-pay | Attending: Emergency Medicine | Admitting: Emergency Medicine

## 2013-01-26 DIAGNOSIS — R071 Chest pain on breathing: Secondary | ICD-10-CM | POA: Insufficient documentation

## 2013-01-26 DIAGNOSIS — J45909 Unspecified asthma, uncomplicated: Secondary | ICD-10-CM | POA: Insufficient documentation

## 2013-01-26 DIAGNOSIS — F17200 Nicotine dependence, unspecified, uncomplicated: Secondary | ICD-10-CM | POA: Insufficient documentation

## 2013-01-26 DIAGNOSIS — Z9889 Other specified postprocedural states: Secondary | ICD-10-CM | POA: Insufficient documentation

## 2013-01-26 DIAGNOSIS — G43909 Migraine, unspecified, not intractable, without status migrainosus: Secondary | ICD-10-CM | POA: Insufficient documentation

## 2013-01-26 DIAGNOSIS — R0789 Other chest pain: Secondary | ICD-10-CM

## 2013-01-26 DIAGNOSIS — F341 Dysthymic disorder: Secondary | ICD-10-CM | POA: Insufficient documentation

## 2013-01-26 DIAGNOSIS — Z791 Long term (current) use of non-steroidal anti-inflammatories (NSAID): Secondary | ICD-10-CM | POA: Insufficient documentation

## 2013-01-26 LAB — CBC WITH DIFFERENTIAL/PLATELET
Eosinophils Absolute: 0.2 10*3/uL (ref 0.0–0.7)
Eosinophils Relative: 2 % (ref 0–5)
HCT: 41.5 % (ref 36.0–46.0)
Lymphocytes Relative: 20 % (ref 12–46)
Lymphs Abs: 2.1 10*3/uL (ref 0.7–4.0)
MCH: 32.1 pg (ref 26.0–34.0)
MCV: 90.6 fL (ref 78.0–100.0)
Monocytes Absolute: 0.8 10*3/uL (ref 0.1–1.0)
Monocytes Relative: 8 % (ref 3–12)
RBC: 4.58 MIL/uL (ref 3.87–5.11)
WBC: 10.2 10*3/uL (ref 4.0–10.5)

## 2013-01-26 LAB — BASIC METABOLIC PANEL
BUN: 9 mg/dL (ref 6–23)
CO2: 26 mEq/L (ref 19–32)
Calcium: 8.9 mg/dL (ref 8.4–10.5)
Creatinine, Ser: 0.71 mg/dL (ref 0.50–1.10)
GFR calc non Af Amer: >90 mL/min (ref >90)
Glucose, Bld: 93 mg/dL (ref 70–99)
Sodium: 140 mEq/L (ref 135–145)

## 2013-01-26 MED ORDER — KETOROLAC TROMETHAMINE 30 MG/ML IJ SOLN
30.0000 mg | Freq: Once | INTRAMUSCULAR | Status: AC
  Administered 2013-01-26: 30 mg via INTRAVENOUS
  Filled 2013-01-26: qty 1

## 2013-01-26 MED ORDER — TRAMADOL HCL 50 MG PO TABS: 50.0000 mg | ORAL_TABLET | Freq: Four times a day (QID) | ORAL | Status: DC | PRN

## 2013-01-26 NOTE — ED Notes (Signed)
TO ED via [facility] from home -- was at pool and started having midsternal chest pain.

## 2013-01-26 NOTE — ED Provider Notes (Signed)
History    CSN: [id number] Arrival date & time 01/26/13  1659  First MD Initiated Contact with Patient 01/26/13 1702     No chief complaint on file.  (Consider location/radiation/quality/duration/timing/severity/associated sxs/prior Treatment) HPI Comments: Patient presents to ER for evaluation of chest pain. Patient reports that she had sudden onset of sharp pain in the center of her chest 2 hours ago while lying outside by the fluid. Pain has been continuous since it started. It is a sharp and stabbing pain in the center of her chest. It feels better if she puts pressure on the area, but worsens if she moves. Patient denies injury. No fever or cough.  Past Medical History  Diagnosis Date  . Migraine   . Back pain     s/p surgery 8-12  . Asthma   . Anxiety and depression    Past Surgical History  Procedure Laterality Date  . Back surgery  03/28/2011  . Hsyterectomy and unilateral oophorectomy per patient    . Bladder surgery      at time of hysterectomy  . Hand surgery  2009    R hand, due to a Fx    Family History  Problem Relation Age of Onset  . Cancer Mother     type?  Marland Kitchen Hypertension Mother   . Stroke Mother   . Sudden death Father   . Heart attack Father     F MI at age 90 and 51  . Colon cancer Neg Hx   . Breast cancer Neg Hx    History  Substance Use Topics  . Smoking status: Current Every Day Smoker -- 0.50 packs/day    Types: Cigarettes  . Smokeless tobacco: Never Used  . Alcohol Use: No   OB History   Grav Para Term Preterm Abortions TAB SAB Ect Mult Living                 Review of Systems  Respiratory: Negative for cough.   Cardiovascular: Positive for chest pain.  Gastrointestinal: Negative.   All other systems reviewed and are negative.    Allergies  Review of patient's allergies indicates no known allergies.  Home Medications   Current Outpatient Rx  Name  Route  Sig  Dispense  Refill  . acetaminophen (TYLENOL) 500 MG tablet    Oral   Take 500 mg by mouth every 6 (six) hours as needed. Pain.         . citalopram (CELEXA) 40 MG tablet   Oral   Take 1 tablet (40 mg total) by mouth daily.   30 tablet   3   . clonazePAM (KLONOPIN) 0.5 MG tablet   Oral   Take 1 tablet (0.5 mg total) by mouth 2 (two) times daily as needed for anxiety.   60 tablet   0   . naproxen (NAPROSYN) 500 MG tablet   Oral   Take 500 mg by mouth 2 (two) times daily with a meal.         . SUMAtriptan Succinate (IMITREX PO)   Oral   Take by mouth.          There were no vitals taken for this visit. Physical Exam  Constitutional: She is oriented to person, place, and time. She appears well-developed and well-nourished. No distress.  HENT:  Head: Normocephalic and atraumatic.  Right Ear: Hearing normal.  Left Ear: Hearing normal.  Nose: Nose normal.  Mouth/Throat: Oropharynx is clear and moist and mucous membranes are  normal.  Eyes: Conjunctivae and EOM are normal. Pupils are equal, round, and reactive to light.  Neck: Normal range of motion. Neck supple.  Cardiovascular: Regular rhythm, S1 normal and S2 normal.  Exam reveals no gallop and no friction rub.   No murmur heard. Pulmonary/Chest: Effort normal and breath sounds normal. No respiratory distress. She exhibits no tenderness.    Abdominal: Soft. Normal appearance and bowel sounds are normal. There is no hepatosplenomegaly. There is no tenderness. There is no rebound, no guarding, no tenderness at McBurney's point and negative Murphy's sign. No hernia.  Musculoskeletal: Normal range of motion.  Neurological: She is alert and oriented to person, place, and time. She has normal strength. No cranial nerve deficit or sensory deficit. Coordination normal. GCS eye subscore is 4. GCS verbal subscore is 5. GCS motor subscore is 6.  Skin: Skin is warm, dry and intact. No rash noted. No cyanosis.  Psychiatric: She has a normal mood and affect. Her speech is normal and behavior is  normal. Thought content normal.    ED Course  Procedures (including critical care time)  EKG:  Date: 01/26/2013  Rate: 72  Rhythm: normal sinus rhythm  QRS Axis: normal  Intervals: normal  ST/T Wave abnormalities: normal  Conduction Disutrbances: none  Narrative Interpretation: unremarkable      Labs Reviewed  CBC WITH DIFFERENTIAL  BASIC METABOLIC PANEL  TROPONIN I   No results found. Diagnosis: 1. Chest wall pain 2. Anxiety  MDM  Patient presents to the ER for evaluation of chest pain. Patient reports that she had onset of pain in the center of her chest 2 hours before coming to the ER. Patient has a very significant reproducible component of the pain. Pain is significantly worsened by moving the torso, such as sitting up from a lying position. This is improved if she presses on the chest and stabilizes the chest or movement. This is very consistent with musculoskeletal pain, patient has minimal cardiac risk factors. Pain is extremely atypical and I do not suspect acute coronary syndrome. She is not short of breath, not tachycardic, Wells criteria and PERC negative. No concern for PE at this time.  Cardiac workup is negative. Patient will be discharged with treatment including NSAIDs and rest.  Gilda Crease, MD 01/26/13 [phone]

## 2013-06-08 ENCOUNTER — Other Ambulatory Visit: Payer: Self-pay

## 2014-03-06 ENCOUNTER — Ambulatory Visit: Payer: Self-pay | Admitting: Gynecology

## 2014-06-18 ENCOUNTER — Other Ambulatory Visit: Payer: Self-pay

## 2014-06-18 DIAGNOSIS — Z1231 Encounter for screening mammogram for malignant neoplasm of breast: Secondary | ICD-10-CM

## 2014-06-19 ENCOUNTER — Ambulatory Visit: Payer: Medicaid Other | Admitting: Obstetrics

## 2014-06-20 ENCOUNTER — Ambulatory Visit: Payer: Self-pay

## 2014-10-20 ENCOUNTER — Inpatient Hospital Stay (HOSPITAL_COMMUNITY)
Admission: AD | Admit: 2014-10-20 | Discharge: 2014-10-20 | Disposition: A | Discharge status: Home / Self Care | Payer: Medicaid Other | Source: Ambulatory Visit | Attending: Obstetrics & Gynecology | Admitting: Obstetrics & Gynecology

## 2014-10-20 ENCOUNTER — Emergency Department: Payer: Self-pay | Admitting: Emergency Medicine

## 2014-10-20 ENCOUNTER — Encounter (HOSPITAL_COMMUNITY): Payer: Self-pay | Admitting: General Practice

## 2014-10-20 DIAGNOSIS — F17210 Nicotine dependence, cigarettes, uncomplicated: Secondary | ICD-10-CM | POA: Diagnosis not present

## 2014-10-20 DIAGNOSIS — L723 Sebaceous cyst: Secondary | ICD-10-CM | POA: Insufficient documentation

## 2014-10-20 DIAGNOSIS — L02224 Furuncle of groin: Secondary | ICD-10-CM | POA: Diagnosis present

## 2014-10-20 LAB — URINALYSIS, ROUTINE W REFLEX MICROSCOPIC
Bilirubin Urine: NEGATIVE
Glucose, UA: NEGATIVE mg/dL
HGB URINE DIPSTICK: NEGATIVE
Ketones, ur: NEGATIVE mg/dL
LEUKOCYTES UA: NEGATIVE
NITRITE: NEGATIVE
PH: 6 (ref 5.0–8.0)
PROTEIN: NEGATIVE mg/dL
UROBILINOGEN UA: 2 mg/dL — AB (ref 0.0–1.0)

## 2014-10-20 MED ORDER — CEPHALEXIN 500 MG PO CAPS: 500.0000 mg | ORAL_CAPSULE | Freq: Four times a day (QID) | ORAL | Status: DC

## 2014-10-20 MED ORDER — CEPHALEXIN 500 MG PO CAPS
500.0000 mg | ORAL_CAPSULE | Freq: Once | ORAL | Status: AC
  Administered 2014-10-20: 500 mg via ORAL
  Filled 2014-10-20: qty 1

## 2014-10-20 MED ORDER — LIDOCAINE HCL 2 % EX GEL
1.0000 "application " | Freq: Once | CUTANEOUS | Status: AC
  Administered 2014-10-20: 1 via TOPICAL
  Filled 2014-10-20: qty 5

## 2014-10-20 NOTE — MAU Note (Signed)
Pt presents to MAU with c/o recurring "boils" right groin area that keeps coming back. Pt states that she has had the same type of boil 3-4x/month for the past 3 years. [facility] and has seen a dermotologist. The dermatologist put her on bactrim which she still takes for the past two months but it is not helping.

## 2014-10-20 NOTE — MAU Provider Note (Signed)
History     CSN: [id number]  Arrival date and time: 10/20/14 1529   First Provider Initiated Contact with Patient 10/20/14 1629      Chief Complaint  Patient presents with  . Recurrent Skin Infections   HPI Pt is not pregnant and presents to MAU with c/o recurring "boils" right groin area that keeps coming back. Pt states that she has had the same type of boil 3-4x/month for the past 3 years. [facility] and has seen a dermotologist. The dermatologist put her on bactrim which she still takes for the past two months but it is not helping.          Past Medical History  Diagnosis Date  . Migraine   . Back pain     s/p surgery 8-12  . Asthma   . Anxiety and depression     Past Surgical History  Procedure Laterality Date  . Back surgery  03/28/2011  . Hsyterectomy and unilateral oophorectomy per patient    . Bladder surgery      at time of hysterectomy  . Hand surgery  2009    R hand, due to a Fx     Family History  Problem Relation Age of Onset  . Cancer Mother     type?  Marland Kitchen. Hypertension Mother   . Stroke Mother   . Sudden death Father   . Heart attack Father     F MI at age 43 and 11M  . Colon cancer Neg Hx   . Breast cancer Neg Hx     History  Substance Use Topics  . Smoking status: Current Every Day Smoker -- 0.25 packs/day    Types: Cigarettes  . Smokeless tobacco: Never Used  . Alcohol Use: No    Allergies: No Known Allergies  Prescriptions prior to admission  Medication Sig Dispense Refill Last Dose  . naproxen sodium (ANAPROX) 220 MG tablet Take 440-660 mg by mouth 3 (three) times daily as needed (head or back pain).   10/19/2014 at Unknown time  . oxyCODONE-acetaminophen (PERCOCET/ROXICET) 5-325 MG per tablet Take 1 tablet by mouth every 4 (four) hours as needed for moderate pain.   Past Week at Unknown time  . sulfamethoxazole-trimethoprim (BACTRIM DS,SEPTRA DS) 800-160 MG per tablet Take 1 tablet by mouth once.    10/19/2014 at Unknown time  . traMADol (ULTRAM) 50 MG tablet Take 1 tablet (50 mg total) by mouth every 6 (six) hours as needed for pain. (Patient not taking: Reported on 10/20/2014) 20 tablet 0     Review of Systems  Constitutional: Negative for fever and chills.  Gastrointestinal: Negative for nausea, vomiting, abdominal pain and diarrhea.  Genitourinary: Negative for dysuria.   Physical Exam   Blood pressure 113/67, pulse 77, temperature 98.3 F (36.8 C), temperature source Oral, resp. rate 18, height 5\' 4"  (1.626 m), weight 140 lb (63.504 kg).  Physical Exam  Vitals reviewed. Constitutional: She is oriented to person, place, and time. She appears well-developed and well-nourished.  Uncomfortable appearing  HENT:  Head: Normocephalic.  Eyes: Pupils are equal, round, and reactive to light.  Neck: Normal range of motion. Neck supple.  Cardiovascular: Normal rate.   Respiratory: Effort normal.  GI: Soft. She exhibits no distension. There is no tenderness. There is no rebound and no guarding.  Genitourinary:  Right groin large grape size reddened sebaceous cyst- topical lidocaine jelly 2 % applied then injected with lidocaine- incised with #14 blade with  large amount of serous fluid- pt tolerated procedure well  Musculoskeletal: Normal range of motion.  Neurological: She is alert and oriented to person, place, and time.  Skin: Skin is warm and dry.  Psychiatric: She has a normal mood and affect.    MAU Course  INCISION AND DRAINAGE Date/Time: 10/20/2014 10:18 PM Performed by: Jean Rosenthal Authorized by: Adam Phenix Consent: Verbal consent obtained. Written consent obtained. Risks and benefits: risks, benefits and alternatives were discussed Consent given by: patient Patient understanding: patient states understanding of the procedure being performed Patient consent: the patient's understanding of the procedure matches consent given Procedure consent: procedure consent  matches procedure scheduled Relevant documents: relevant documents present and verified Test results: test results available and properly labeled Site marked: the operative site was marked Imaging studies: imaging studies not available Patient identity confirmed: verbally with patient Time out: Immediately prior to procedure a "time out" was called to verify the correct patient, procedure, equipment, support staff and site/side marked as required. Type: abscess Body area: lower extremity Anesthesia: local infiltration Local anesthetic: lidocaine 1% without epinephrine and topical anesthetic Patient sedated: no Scalpel size: 15 Needle gauge: 22 Incision type: single straight Complexity: simple Drainage: serous Drainage amount: moderate Wound treatment: wound left open Packing material: none Patient tolerance: Patient tolerated the procedure well with no immediate complications    I&D of sebaceous cyst right groin with mod amount of serous fluid Keflex x 1 dose given in MAU Assessment and Plan  Sebaceous cyst Keflex QID x 10 days then one daily F/u with PCP  LINEBERRY,SUSAN 10/20/2014, 5:36 PM

## 2014-12-05 ENCOUNTER — Telehealth: Payer: Self-pay | Admitting: Internal Medicine

## 2014-12-05 NOTE — Telephone Encounter (Signed)
Pt has not been seen by Dr. Drue NovelPaz since 11/2012, he is not actively prescribing any medications. She may sign a release of information that way all her records can be sent to her new doctor if she would like.

## 2014-12-05 NOTE — Telephone Encounter (Signed)
Relation to pt: self Call back number: [phone]   Reason for call:  Pt stated she had to find another MD due to insurance changing and would like to know what medication MD prescribed

## 2014-12-12 ENCOUNTER — Telehealth: Payer: Self-pay | Admitting: *Deleted

## 2014-12-12 NOTE — Telephone Encounter (Signed)
Medical record request received via fax from [facility] requesting all medical records. Forwarded to [email or web address] to email/fax to medical records. JG//CMA

## 2015-01-03 ENCOUNTER — Emergency Department (HOSPITAL_COMMUNITY): Payer: Medicaid Other

## 2015-01-03 ENCOUNTER — Emergency Department (HOSPITAL_COMMUNITY)
Admission: EM | Admit: 2015-01-03 | Discharge: 2015-01-04 | Disposition: A | Discharge status: Home / Self Care | Payer: Medicaid Other | Attending: Emergency Medicine | Admitting: Emergency Medicine

## 2015-01-03 ENCOUNTER — Encounter (HOSPITAL_COMMUNITY): Payer: Self-pay | Admitting: Emergency Medicine

## 2015-01-03 DIAGNOSIS — R51 Headache: Secondary | ICD-10-CM | POA: Diagnosis present

## 2015-01-03 DIAGNOSIS — R42 Dizziness and giddiness: Secondary | ICD-10-CM | POA: Diagnosis not present

## 2015-01-03 DIAGNOSIS — H539 Unspecified visual disturbance: Secondary | ICD-10-CM | POA: Insufficient documentation

## 2015-01-03 DIAGNOSIS — R519 Headache, unspecified: Secondary | ICD-10-CM

## 2015-01-03 DIAGNOSIS — Z8739 Personal history of other diseases of the musculoskeletal system and connective tissue: Secondary | ICD-10-CM | POA: Insufficient documentation

## 2015-01-03 DIAGNOSIS — Z720 Tobacco use: Secondary | ICD-10-CM | POA: Diagnosis not present

## 2015-01-03 LAB — BASIC METABOLIC PANEL
Anion gap: 8 (ref 5–15)
BUN: 10 mg/dL (ref 6–20)
CALCIUM: 8.8 mg/dL — AB (ref 8.9–10.3)
CO2: 25 mmol/L (ref 22–32)
CREATININE: 0.55 mg/dL (ref 0.44–1.00)
Chloride: 106 mmol/L (ref 101–111)
GFR calc Af Amer: >60 mL/min (ref >60)
GFR calc non Af Amer: >60 mL/min (ref >60)
GLUCOSE: 90 mg/dL (ref 65–99)
Potassium: 3.4 mmol/L — ABNORMAL LOW (ref 3.5–5.1)
Sodium: 139 mmol/L (ref 135–145)

## 2015-01-03 LAB — CBC
HEMATOCRIT: 41.4 % (ref 36.0–46.0)
HEMOGLOBIN: 14.1 g/dL (ref 12.0–15.0)
MCH: 32.3 pg (ref 26.0–34.0)
MCHC: 34.1 g/dL (ref 30.0–36.0)
MCV: 94.7 fL (ref 78.0–100.0)
Platelets: 242 10*3/uL (ref 150–400)
RBC: 4.37 MIL/uL (ref 3.87–5.11)
RDW: 13 % (ref 11.5–15.5)
WBC: 13.2 10*3/uL — AB (ref 4.0–10.5)

## 2015-01-03 MED ORDER — DIPHENHYDRAMINE HCL 50 MG/ML IJ SOLN
25.0000 mg | Freq: Once | INTRAMUSCULAR | Status: AC
  Administered 2015-01-03: 25 mg via INTRAVENOUS
  Filled 2015-01-03: qty 1

## 2015-01-03 MED ORDER — SODIUM CHLORIDE 0.9 % IV BOLUS (SEPSIS)
1000.0000 mL | Freq: Once | INTRAVENOUS | Status: AC
  Administered 2015-01-03: 1000 mL via INTRAVENOUS

## 2015-01-03 MED ORDER — METOCLOPRAMIDE HCL 5 MG/ML IJ SOLN
10.0000 mg | Freq: Once | INTRAMUSCULAR | Status: AC
  Administered 2015-01-03: 10 mg via INTRAVENOUS
  Filled 2015-01-03: qty 2

## 2015-01-03 NOTE — ED Provider Notes (Signed)
CSN: [id number]     Arrival date & time 01/03/15  1955 History   First MD Initiated Contact with Patient 01/03/15 2148     Chief Complaint  Patient presents with  . Near Syncope  . Headache   Bailey Mcdaniel is a 44 y.o. female who is a smoker and with a history of lupus and hysterectomy who presents to the ED complaining of an intermittent posterior headache since yesterday and dizziness. The patient reports that her posterior headache started yesterday and has been intermittent. She reports aleve will resolve her headache for about 45 minutes and then return. She currently complains of a 10 out of 10 sharp posterior headache that is better when she places pressure to the back of her head. She reports room spinning dizziness that is worse with position changes. She reports intermittent blurry vision that lasts approximately 15 minutes. She denies current blurry vision. She denies syncope or falls. The patient has trauma to the back of her head. The patient denies fevers, chills, neck stiffness, numbness, tingling, weakness, history of CVA, chest pain, coughing, ear pain, eye pain, photophobia, rashes, or neck pain.   (Consider location/radiation/quality/duration/timing/severity/associated sxs/prior Treatment) HPI  Past Medical History  Diagnosis Date  . Lupus    Past Surgical History  Procedure Laterality Date  . Abdominal hysterectomy    . Back surgery    . Hand surgery     Family History  Problem Relation Age of Onset  . Diabetes Mother   . CAD Mother   . Heart attack Father    History  Substance Use Topics  . Smoking status: Current Every Day Smoker    Types: Cigarettes  . Smokeless tobacco: Not on file  . Alcohol Use: Yes   OB History    No data available     Review of Systems  Constitutional: Negative for fever and chills.  HENT: Negative for congestion, ear pain, sore throat and trouble swallowing.   Eyes: Positive for visual disturbance. Negative for photophobia  and pain.  Respiratory: Negative for cough, shortness of breath and wheezing.   Cardiovascular: Negative for chest pain and palpitations.  Gastrointestinal: Negative for nausea, vomiting, abdominal pain and diarrhea.  Genitourinary: Negative for dysuria and frequency.  Musculoskeletal: Negative for back pain and neck pain.  Skin: Negative for rash.  Neurological: Positive for dizziness, light-headedness and headaches. Negative for seizures, syncope, facial asymmetry, speech difficulty, weakness and numbness.      Allergies  Review of patient's allergies indicates no known allergies.  Home Medications   Prior to Admission medications   Medication Sig Start Date End Date Taking? Authorizing Provider  ibuprofen (ADVIL,MOTRIN) 200 MG tablet Take 400 mg by mouth every 6 (six) hours as needed for moderate pain.    Historical Provider, MD   BP 104/57 mmHg  Pulse 82  Temp(Src) 98.4 F (36.9 C) (Oral)  Resp 18  SpO2 96% Physical Exam  Constitutional: She is oriented to person, place, and time. She appears well-developed and well-nourished. No distress.  Nontoxic appearing.  HENT:  Head: Normocephalic and atraumatic.  Right Ear: External ear normal.  Left Ear: External ear normal.  Nose: Nose normal.  Mouth/Throat: Oropharynx is clear and moist. No oropharyngeal exudate.  Bilateral tympanic membranes are pearly-gray without erythema or loss of landmarks.   Eyes: Conjunctivae and EOM are normal. Pupils are equal, round, and reactive to light. Right eye exhibits no discharge. Left eye exhibits no discharge.  EOMs intact. No nystagmus.  Neck: Normal  range of motion. Neck supple. No JVD present. No tracheal deviation present.  Full range of motion of her neck. Patient able to lace her chin to her chest and look up to the ceiling. Patient able to turn her neck greater than 45 in each direction. No meningeal signs.  Cardiovascular: Normal rate, regular rhythm, normal heart sounds and intact  distal pulses.  Exam reveals no gallop and no friction rub.   No murmur heard. Pulmonary/Chest: Effort normal and breath sounds normal. No respiratory distress. She has no wheezes. She has no rales.  Lungs are clear to auscultation bilaterally.  Abdominal: Soft. There is no tenderness.  Abdomen is soft and nontender to palpation.  Musculoskeletal: She exhibits no edema.  Lymphadenopathy:    She has no cervical adenopathy.  Neurological: She is alert and oriented to person, place, and time. She has normal reflexes. No cranial nerve deficit. Coordination normal.  Cranial nerves are intact bilaterally. Finger-to-nose intact bilaterally. EOMs intact bilaterally. No pronator drift. Bilateral patellar DTRs are intact. Sensation is intact to her bilateral upper and lower extremities.  Skin: Skin is warm and dry. No rash noted. She is not diaphoretic. No erythema. No pallor.  Psychiatric: She has a normal mood and affect. Her behavior is normal.  Nursing note and vitals reviewed.   ED Course  Procedures (including critical care time) Labs Review Labs Reviewed  CBC - Abnormal; Notable for the following:    WBC 13.2 (*)    All other components within normal limits  BASIC METABOLIC PANEL - Abnormal; Notable for the following:    Potassium 3.4 (*)    Calcium 8.8 (*)    All other components within normal limits    Imaging Review Ct Head Wo Contrast  01/03/2015   CLINICAL DATA:  Posterior headache, acute onset.  Initial encounter.  EXAM: CT HEAD WITHOUT CONTRAST  TECHNIQUE: Contiguous axial images were obtained from the base of the skull through the vertex without intravenous contrast.  COMPARISON:  None.  FINDINGS: There is no evidence of acute infarction, mass lesion, or intra- or extra-axial hemorrhage on CT.  The posterior fossa, including the cerebellum, brainstem and fourth ventricle, is within normal limits. The third and lateral ventricles, and basal ganglia are unremarkable in appearance.  The cerebral hemispheres are symmetric in appearance, with normal gray-white differentiation. No mass effect or midline shift is seen.  There is no evidence of fracture; visualized osseous structures are unremarkable in appearance. The visualized portions of the orbits are within normal limits. The paranasal sinuses and mastoid air cells are well-aerated. No significant soft tissue abnormalities are seen.  IMPRESSION: Unremarkable noncontrast CT of the head.   Electronically Signed   By: Roanna RaiderJeffery  Chang M.D.   On: 01/03/2015 23:19     EKG Interpretation   Date/Time:  Thursday January 03 2015 20:10:30 EDT Ventricular Rate:  91 PR Interval:  125 QRS Duration: 82 QT Interval:  350 QTC Calculation: 431 R Axis:   91 Text Interpretation:  Sinus rhythm Borderline right axis deviation Minimal  ST depression, inferior leads No old tracing to compare Confirmed by  MILLER  MD, BRIAN ([id number]) on 01/03/2015 10:49:15 PM      Filed Vitals:   01/03/15 2009 01/03/15 2230 01/04/15 0102  BP: 130/72 111/64 104/57  Pulse: 86 87 82  Temp: 98.4 F (36.9 C)    TempSrc: Oral    Resp: 20 18 18   SpO2: 100% 98% 96%     MDM   Meds given  in ED:  Medications  metoCLOPramide (REGLAN) injection 10 mg (10 mg Intravenous Given 01/03/15 2347)  diphenhydrAMINE (BENADRYL) injection 25 mg (25 mg Intravenous Given 01/03/15 2358)  sodium chloride 0.9 % bolus 1,000 mL (1,000 mLs Intravenous New Bag/Given 01/03/15 2358)    New Prescriptions   No medications on file    Final diagnoses:  Bad headache   This is a 44 y.o. female who is a smoker and with a history of lupus and hysterectomy who presents to the ED complaining of an intermittent posterior headache since yesterday and dizziness. The patient reports that her posterior headache started yesterday and has been intermittent.  Pt HA treated and improved while in ED.  Presentation is non concerning for [facility], ICH, Meningitis, or temporal arteritis. Pt is afebrile with no focal  neuro deficits, nuchal rigidity, or change in vision. Patient's HA and symptoms completely resolved with fluid bolus, benadryl and reglan. She denies headache, lightheadedness or dizziness prior to discharge. Head CT is unremarkable. Pt is to follow up with PCP to discuss prophylactic medication. I advised the patient to follow-up with their primary care provider this week. I advised the patient to return to the emergency department with new or worsening symptoms or new concerns. The patient verbalized understanding and agreement with plan.      Everlene Farrier, PA-C 01/04/15 1610  Mancel Bale, MD 01/05/15 1352

## 2015-01-03 NOTE — ED Notes (Signed)
Will, PA, at bedside.

## 2015-01-03 NOTE — ED Notes (Signed)
Pt states all day today she has been getting dizzy, blurred vision, blacking out, and having severe pain to the back of her neck and head

## 2015-01-04 NOTE — Discharge Instructions (Signed)

## 2015-02-14 ENCOUNTER — Encounter (HOSPITAL_COMMUNITY): Payer: Self-pay | Admitting: General Practice

## 2015-02-27 ENCOUNTER — Telehealth: Payer: Self-pay | Admitting: Internal Medicine

## 2015-02-27 NOTE — Telephone Encounter (Signed)
Pt came in today and signed another request for records. I am forwarding request to [facility]. She needed her office visit with Dr. Drue Novel that concerned anxiety and depresion sent to Dr. Clarene Duke so he will prescribe her meds. Sent those records.

## 2015-06-26 ENCOUNTER — Encounter (HOSPITAL_COMMUNITY): Payer: Self-pay | Admitting: Emergency Medicine

## 2015-06-26 ENCOUNTER — Emergency Department (HOSPITAL_COMMUNITY): Payer: Medicaid Other

## 2015-06-26 ENCOUNTER — Emergency Department (HOSPITAL_COMMUNITY)
Admission: EM | Admit: 2015-06-26 | Discharge: 2015-06-26 | Disposition: A | Discharge status: Home / Self Care | Payer: Medicaid Other | Attending: Emergency Medicine | Admitting: Emergency Medicine

## 2015-06-26 DIAGNOSIS — Z8679 Personal history of other diseases of the circulatory system: Secondary | ICD-10-CM | POA: Diagnosis not present

## 2015-06-26 DIAGNOSIS — Z8739 Personal history of other diseases of the musculoskeletal system and connective tissue: Secondary | ICD-10-CM | POA: Insufficient documentation

## 2015-06-26 DIAGNOSIS — Z8659 Personal history of other mental and behavioral disorders: Secondary | ICD-10-CM | POA: Diagnosis not present

## 2015-06-26 DIAGNOSIS — F17210 Nicotine dependence, cigarettes, uncomplicated: Secondary | ICD-10-CM | POA: Insufficient documentation

## 2015-06-26 DIAGNOSIS — Z9889 Other specified postprocedural states: Secondary | ICD-10-CM | POA: Insufficient documentation

## 2015-06-26 DIAGNOSIS — Z3202 Encounter for pregnancy test, result negative: Secondary | ICD-10-CM | POA: Insufficient documentation

## 2015-06-26 DIAGNOSIS — R1031 Right lower quadrant pain: Secondary | ICD-10-CM | POA: Diagnosis present

## 2015-06-26 DIAGNOSIS — J45909 Unspecified asthma, uncomplicated: Secondary | ICD-10-CM | POA: Insufficient documentation

## 2015-06-26 DIAGNOSIS — Z792 Long term (current) use of antibiotics: Secondary | ICD-10-CM | POA: Insufficient documentation

## 2015-06-26 DIAGNOSIS — R109 Unspecified abdominal pain: Secondary | ICD-10-CM | POA: Insufficient documentation

## 2015-06-26 DIAGNOSIS — R110 Nausea: Secondary | ICD-10-CM | POA: Diagnosis not present

## 2015-06-26 LAB — COMPREHENSIVE METABOLIC PANEL
ALBUMIN: 4.1 g/dL (ref 3.5–5.0)
ALK PHOS: 61 U/L (ref 38–126)
ALT: 17 U/L (ref 14–54)
ANION GAP: 10 (ref 5–15)
AST: 15 U/L (ref 15–41)
BILIRUBIN TOTAL: 0.7 mg/dL (ref 0.3–1.2)
BUN: 7 mg/dL (ref 6–20)
CALCIUM: 8.8 mg/dL — AB (ref 8.9–10.3)
CO2: 25 mmol/L (ref 22–32)
CREATININE: 0.66 mg/dL (ref 0.44–1.00)
Chloride: 104 mmol/L (ref 101–111)
GFR calc Af Amer: >60 mL/min (ref >60)
GFR calc non Af Amer: >60 mL/min (ref >60)
GLUCOSE: 113 mg/dL — AB (ref 65–99)
Potassium: 3.6 mmol/L (ref 3.5–5.1)
Sodium: 139 mmol/L (ref 135–145)
Total Protein: 7.1 g/dL (ref 6.5–8.1)

## 2015-06-26 LAB — URINALYSIS, ROUTINE W REFLEX MICROSCOPIC
BILIRUBIN URINE: NEGATIVE
Glucose, UA: NEGATIVE mg/dL
Hgb urine dipstick: NEGATIVE
KETONES UR: NEGATIVE mg/dL
Leukocytes, UA: NEGATIVE
NITRITE: NEGATIVE
Protein, ur: NEGATIVE mg/dL
Specific Gravity, Urine: 1.014 (ref 1.005–1.030)
pH: 6 (ref 5.0–8.0)

## 2015-06-26 LAB — I-STAT BETA HCG BLOOD, ED (MC, WL, AP ONLY)

## 2015-06-26 LAB — LIPASE, BLOOD: Lipase: 31 U/L (ref 11–51)

## 2015-06-26 LAB — CBC
HCT: 43 % (ref 36.0–46.0)
Hemoglobin: 14.9 g/dL (ref 12.0–15.0)
MCH: 32.5 pg (ref 26.0–34.0)
MCHC: 34.7 g/dL (ref 30.0–36.0)
MCV: 93.9 fL (ref 78.0–100.0)
PLATELETS: 237 10*3/uL (ref 150–400)
RBC: 4.58 MIL/uL (ref 3.87–5.11)
RDW: 12.7 % (ref 11.5–15.5)
WBC: 10.6 10*3/uL — ABNORMAL HIGH (ref 4.0–10.5)

## 2015-06-26 MED ORDER — DICYCLOMINE HCL 20 MG PO TABS: 20.0000 mg | ORAL_TABLET | Freq: Two times a day (BID) | ORAL | Status: DC

## 2015-06-26 MED ORDER — SODIUM CHLORIDE 0.9 % IV SOLN
INTRAVENOUS | Status: DC
  Administered 2015-06-26: 10 mL/h via INTRAVENOUS

## 2015-06-26 MED ORDER — IOHEXOL 300 MG/ML  SOLN: 25.0000 mL | Freq: Once | INTRAMUSCULAR | Status: DC | PRN

## 2015-06-26 NOTE — ED Notes (Signed)
Sent from urgent care for possible appendicitis. Went there today for a head cold, abd pain, nausea, and constipation. Pt complaining of right and left lower abdominal tenderness. No fever, no blood work done at urgent care. States cold-like symptoms x 2 days with constipation/abdominal pain x 2 weeks.

## 2015-06-26 NOTE — Discharge Instructions (Signed)

## 2015-06-26 NOTE — ED Provider Notes (Signed)
CSN: [id number]     Arrival date & time 06/26/15  1220 History   First MD Initiated Contact with Patient 06/26/15 1237     Chief Complaint  Patient presents with  . Abdominal Pain  . Nausea     (Consider location/radiation/quality/duration/timing/severity/associated sxs/prior Treatment) HPI Comments: Patient here complaining of abdominal discomfort 2 weeks that is relieved after she moves her bowels. Pain is characterized as sharp and starts her right lower quadrant and migrated to the left lower quadrant. No associated fever or chills. No vomiting or diarrhea. Patient is status post hysterectomy. No vaginal bleeding or discharge. Has been using laxatives without relief.  Patient is a 44 y.o. female presenting with abdominal pain. The history is provided by the patient.  Abdominal Pain   Past Medical History  Diagnosis Date  . Migraine   . Back pain     s/p surgery 8-12  . Asthma   . Anxiety and depression   . Lupus [facility])    Past Surgical History  Procedure Laterality Date  . Back surgery  03/28/2011  . Hsyterectomy and unilateral oophorectomy per patient    . Bladder surgery      at time of hysterectomy  . Hand surgery  2009    R hand, due to a Fx   . Abdominal hysterectomy    . Back surgery    . Hand surgery     Family History  Problem Relation Age of Onset  . Cancer Mother     type?  Marland Kitchen Hypertension Mother   . Stroke Mother   . Sudden death Father   . Colon cancer Neg Hx   . Breast cancer Neg Hx   . Diabetes Mother   . CAD Mother   . Heart attack Father     F MI at age 75 and M   Social History  Substance Use Topics  . Smoking status: Current Every Day Smoker -- 0.25 packs/day    Types: Cigarettes  . Smokeless tobacco: None  . Alcohol Use: Yes   OB History    Gravida Para Term Preterm AB TAB SAB Ectopic Multiple Living   3 3 0 0 0 0 0 0       Review of Systems  Gastrointestinal: Positive for abdominal pain.  All other systems reviewed and are  negative.     Allergies  Review of patient's allergies indicates no known allergies.  Home Medications   Prior to Admission medications   Medication Sig Start Date End Date Taking? Authorizing Provider  cephALEXin (KEFLEX) 500 MG capsule Take 1 capsule (500 mg total) by mouth 4 (four) times daily. 10/20/14   Jean Rosenthal, NP  ibuprofen (ADVIL,MOTRIN) 200 MG tablet Take 400 mg by mouth every 6 (six) hours as needed for moderate pain.    Historical Provider, MD  naproxen sodium (ANAPROX) 220 MG tablet Take 440-660 mg by mouth 3 (three) times daily as needed (head or back pain).    Historical Provider, MD  oxyCODONE-acetaminophen (PERCOCET/ROXICET) 5-325 MG per tablet Take 1 tablet by mouth every 4 (four) hours as needed for moderate pain.    Historical Provider, MD  traMADol (ULTRAM) 50 MG tablet Take 1 tablet (50 mg total) by mouth every 6 (six) hours as needed for pain. Patient not taking: Reported on 10/20/2014 01/26/13   Gilda Crease, MD   BP 117/85 mmHg  Pulse 74  Temp(Src) 97.9 F (36.6 C) (Oral)  Resp 18  SpO2 100% Physical Exam  Constitutional: She is oriented to person, place, and time. She appears well-developed and well-nourished.  Non-toxic appearance. No distress.  HENT:  Head: Normocephalic and atraumatic.  Eyes: Conjunctivae, EOM and lids are normal. Pupils are equal, round, and reactive to light.  Neck: Normal range of motion. Neck supple. No tracheal deviation present. No thyroid mass present.  Cardiovascular: Normal rate, regular rhythm and normal heart sounds.  Exam reveals no gallop.   No murmur heard. Pulmonary/Chest: Effort normal and breath sounds normal. No stridor. No respiratory distress. She has no decreased breath sounds. She has no wheezes. She has no rhonchi. She has no rales.  Abdominal: Soft. Normal appearance and bowel sounds are normal. She exhibits no distension. There is no tenderness. There is no rebound and no CVA tenderness.   Musculoskeletal: Normal range of motion. She exhibits no edema or tenderness.  Neurological: She is alert and oriented to person, place, and time. She has normal strength. No cranial nerve deficit or sensory deficit. GCS eye subscore is 4. GCS verbal subscore is 5. GCS motor subscore is 6.  Skin: Skin is warm and dry. No abrasion and no rash noted.  Psychiatric: She has a normal mood and affect. Her speech is normal and behavior is normal.  Nursing note and vitals reviewed.   ED Course  Procedures (including critical care time) Labs Review Labs Reviewed  CBC - Abnormal; Notable for the following:    WBC 10.6 (*)    All other components within normal limits  URINALYSIS, ROUTINE W REFLEX MICROSCOPIC (NOT AT [facility]) - Abnormal; Notable for the following:    APPearance HAZY (*)    All other components within normal limits  LIPASE, BLOOD  COMPREHENSIVE METABOLIC PANEL  I-STAT BETA HCG BLOOD, ED ([facility], [facility], [facility] ONLY)    Imaging Review No results found. I have personally reviewed and evaluated these images and lab results as part of my medical decision-making.   EKG Interpretation None      MDM   Final diagnoses:  None    Workup here is negative and patient will be given GI referral    Lorre NickAnthony Jones Viviani, MD 06/26/15 1355

## 2015-07-24 ENCOUNTER — Other Ambulatory Visit: Payer: Self-pay | Admitting: Gastroenterology

## 2015-08-06 ENCOUNTER — Encounter (HOSPITAL_COMMUNITY): Payer: Self-pay | Admitting: Emergency Medicine

## 2015-08-06 ENCOUNTER — Emergency Department (HOSPITAL_COMMUNITY)
Admission: EM | Admit: 2015-08-06 | Discharge: 2015-08-06 | Disposition: A | Discharge status: Home / Self Care | Payer: Medicaid Other | Attending: Emergency Medicine | Admitting: Emergency Medicine

## 2015-08-06 DIAGNOSIS — M545 Low back pain: Secondary | ICD-10-CM | POA: Insufficient documentation

## 2015-08-06 DIAGNOSIS — F17210 Nicotine dependence, cigarettes, uncomplicated: Secondary | ICD-10-CM | POA: Insufficient documentation

## 2015-08-06 DIAGNOSIS — Z8659 Personal history of other mental and behavioral disorders: Secondary | ICD-10-CM | POA: Insufficient documentation

## 2015-08-06 DIAGNOSIS — Z8679 Personal history of other diseases of the circulatory system: Secondary | ICD-10-CM | POA: Diagnosis not present

## 2015-08-06 DIAGNOSIS — J45909 Unspecified asthma, uncomplicated: Secondary | ICD-10-CM | POA: Diagnosis not present

## 2015-08-06 DIAGNOSIS — G8929 Other chronic pain: Secondary | ICD-10-CM | POA: Diagnosis not present

## 2015-08-06 DIAGNOSIS — Z9889 Other specified postprocedural states: Secondary | ICD-10-CM | POA: Diagnosis not present

## 2015-08-06 DIAGNOSIS — Z79899 Other long term (current) drug therapy: Secondary | ICD-10-CM | POA: Diagnosis not present

## 2015-08-06 MED ORDER — METHOCARBAMOL 500 MG PO TABS: 1000.0000 mg | ORAL_TABLET | Freq: Four times a day (QID) | ORAL | Status: DC | PRN

## 2015-08-06 MED ORDER — HYDROCODONE-ACETAMINOPHEN 5-325 MG PO TABS
1.0000 | ORAL_TABLET | Freq: Once | ORAL | Status: AC
Start: 2015-08-06 — End: 2015-08-06
  Administered 2015-08-06: 1 via ORAL
  Filled 2015-08-06: qty 1

## 2015-08-06 MED ORDER — HYDROCODONE-ACETAMINOPHEN 5-325 MG PO TABS: ORAL_TABLET | ORAL | Status: DC

## 2015-08-06 MED ORDER — LIDOCAINE 5 % EX PTCH: 1.0000 | MEDICATED_PATCH | CUTANEOUS | Status: DC

## 2015-08-06 MED ORDER — PREDNISONE 20 MG PO TABS
60.0000 mg | ORAL_TABLET | Freq: Once | ORAL | Status: AC
  Administered 2015-08-06: 60 mg via ORAL
  Filled 2015-08-06: qty 3

## 2015-08-06 MED ORDER — METHOCARBAMOL 500 MG PO TABS
750.0000 mg | ORAL_TABLET | Freq: Once | ORAL | Status: AC
  Administered 2015-08-06: 750 mg via ORAL
  Filled 2015-08-06: qty 2

## 2015-08-06 MED ORDER — PREDNISONE 50 MG PO TABS: ORAL_TABLET | ORAL | Status: DC

## 2015-08-06 NOTE — Discharge Instructions (Signed)
Take robaxin and/or Vicodin for breakthrough pain, do not drink alcohol, drive, care for children or perfom other critical tasks while taking robaxin and/or Vicodin . ° °Please follow with your primary care doctor in the next 2 days for a check-up. They must obtain records for further management.  ° °Do not hesitate to return to the Emergency Department for any new, worsening or concerning symptoms.  ° °

## 2015-08-06 NOTE — ED Notes (Signed)
Patient presents with lower back pain radiating down bilateral legs, c/o numbness and tingling to same x2 hours. History of back surgery with chronic back pain x3 years. Naproxen 500mg  approximately 1030 today. Patient ambulatory to triage room.

## 2015-08-06 NOTE — ED Provider Notes (Signed)
CSN: [id number]     Arrival date & time 08/06/15  1137 History  By signing my name below, I, Essence Howell, attest that this documentation has been prepared under the direction and in the presence of Wynetta EmeryNicole Rhyder Bratz, PA-C Electronically Signed: Charline BillsEssence Howell, ED Scribe 08/06/2015 at 1:13 PM.   Chief Complaint  Patient presents with  . Back Pain   The history is provided by the patient. No language interpreter was used.   HPI Comments: Bailey Mcdaniel is a 45 y.o. female, with a h/o chronic back pain, who presents to the Emergency Department complaining of gradually worsening left low back pain onset yesterday. Pt denies injury or heavy lifting. She reports constant back pain that radiates down her left leg and is exacerbated with ambulating. Pt states that she has had similar back pain since her back surgery by Dr. Darrelyn HillockGioffre in 2012, but states that pain has never been this severe. She has tried Naproxen without significant relief. Pt denies fever, chills, urinary or bowel incontinence.  Past Medical History  Diagnosis Date  . Migraine   . Back pain     s/p surgery 8-12  . Asthma   . Anxiety and depression   . Lupus [facility])    Past Surgical History  Procedure Laterality Date  . Back surgery  03/28/2011  . Hsyterectomy and unilateral oophorectomy per patient    . Bladder surgery      at time of hysterectomy  . Hand surgery  2009    R hand, due to a Fx   . Abdominal hysterectomy    . Back surgery    . Hand surgery     Family History  Problem Relation Age of Onset  . Cancer Mother     type?  Marland Kitchen. Hypertension Mother   . Stroke Mother   . Sudden death Father   . Colon cancer Neg Hx   . Breast cancer Neg Hx   . Diabetes Mother   . CAD Mother   . Heart attack Father     F MI at age 44 and M   Social History  Substance Use Topics  . Smoking status: Current Every Day Smoker -- 0.25 packs/day    Types: Cigarettes  . Smokeless tobacco: None  . Alcohol Use: Yes   OB History    Gravida Para Term Preterm AB TAB SAB Ectopic Multiple Living   3 3 0 0 0 0 0 0       Review of Systems  A complete 10 system review of systems was obtained and all systems are negative except as noted in the HPI and PMH.   Allergies  Review of patient's allergies indicates no known allergies.  Home Medications   Prior to Admission medications   Medication Sig Start Date End Date Taking? Authorizing Provider  atorvastatin (LIPITOR) 10 MG tablet Take 10 mg by mouth daily.    Historical Provider, MD  dicyclomine (BENTYL) 20 MG tablet Take 1 tablet (20 mg total) by mouth 2 (two) times daily. 06/26/15   Lorre NickAnthony Allen, MD  ibuprofen (ADVIL,MOTRIN) 200 MG tablet Take 400 mg by mouth every 6 (six) hours as needed for moderate pain.    Historical Provider, MD  naproxen sodium (ANAPROX) 220 MG tablet Take 440-660 mg by mouth 3 (three) times daily as needed (head or back pain).    Historical Provider, MD   BP 139/87 mmHg  Pulse 89  Temp(Src) 98.5 F (36.9 C) (Oral)  Resp 18  SpO2  100% Physical Exam  Constitutional: She appears well-developed and well-nourished.  HENT:  Head: Normocephalic.  Eyes: Conjunctivae are normal.  Neck: Normal range of motion.  Cardiovascular: Normal rate, regular rhythm and intact distal pulses.   Pulmonary/Chest: Effort normal.  Abdominal: Soft. There is no tenderness.  Neurological: She is alert.  No point tenderness to percussion of lumbar spinal processes.  No TTP or paraspinal muscular spasm. Strength is 5 out of 5 to bilateral lower extremities at hip and knee; extensor hallucis longus 5 out of 5. Ankle strength 5 out of 5, no clonus, neurovascularly intact. No saddle anaesthesia. Patellar reflexes present and equal but decreased bilaterally bilaterally.    Straight leg raise is positive on the ipsilateral left side at 30.   Psychiatric: She has a normal mood and affect.  Nursing note and vitals reviewed.   ED Course  Procedures (including critical  care time) DIAGNOSTIC STUDIES: Oxygen Saturation is 100% on RA, normal by my interpretation.    COORDINATION OF CARE: 1:00 PM-Discussed treatment plan which includes Robaxin, Prednisone, Norco and follow-up with surgeon with pt at bedside and pt agreed to plan.   Labs Review Labs Reviewed - No data to display  Imaging Review No results found.   EKG Interpretation None      MDM   Final diagnoses:  Acute exacerbation of chronic low back pain    Filed Vitals:   08/06/15 1142 08/06/15 1331  BP: 139/87   Pulse: 89 74  Temp: 98.5 F (36.9 C)   TempSrc: Oral   Resp: 18   SpO2: 100% 100%    Medications  predniSONE (DELTASONE) tablet 60 mg (60 mg Oral Given 08/06/15 1330)  HYDROcodone-acetaminophen (NORCO/VICODIN) 5-325 MG per tablet 1 tablet (1 tablet Oral Given 08/06/15 1327)  methocarbamol (ROBAXIN) tablet 750 mg (750 mg Oral Given 08/06/15 1327)    Bailey Mcdaniel is 45 y.o. female presenting with exacerbation of chronic low back pain.  No neurological deficits and normal neuro exam.  Patient can walk but states is painful.  No loss of bowel or bladder control.  No concern for cauda equina.  No fever, night sweats, weight loss, h/o cancer, IVDU.  RICE protocol and pain medicine indicated and discussed with patient.  Evaluation does not show pathology that would require ongoing emergent intervention or inpatient treatment. Pt is hemodynamically stable and mentating appropriately. Discussed findings and plan with patient/guardian, who agrees with care plan. All questions answered. Return precautions discussed and outpatient follow up given.   Discharge Medication List as of 08/06/2015  1:13 PM    START taking these medications   Details  HYDROcodone-acetaminophen (NORCO/VICODIN) 5-325 MG tablet Take 1-2 tablets by mouth every 6 hours as needed for pain and/or cough., Print    lidocaine (LIDODERM) 5 % Place 1 patch onto the skin daily. Remove & Discard patch within 12 hours or  as directed by MD, Starting 08/06/2015, Until Discontinued, Print    methocarbamol (ROBAXIN) 500 MG tablet Take 2 tablets (1,000 mg total) by mouth 4 (four) times daily as needed (Pain)., Starting 08/06/2015, Until Discontinued, Print    predniSONE (DELTASONE) 50 MG tablet Take 1 tablet daily with breakfast, Print         I personally performed the services described in this documentation, which was scribed in my presence. The recorded information has been reviewed and is accurate.    Wynetta Emery, PA-C 08/06/15 1505  Lyndal Pulley, MD 08/08/15 [phone]

## 2015-09-02 ENCOUNTER — Ambulatory Visit: Payer: Medicaid Other | Attending: Neurosurgery

## 2016-01-24 ENCOUNTER — Other Ambulatory Visit: Payer: Self-pay | Admitting: Gastroenterology

## 2016-01-24 DIAGNOSIS — K5900 Constipation, unspecified: Secondary | ICD-10-CM

## 2016-01-31 ENCOUNTER — Ambulatory Visit
Admission: RE | Admit: 2016-01-31 | Discharge: 2016-01-31 | Disposition: A | Discharge status: Home / Self Care | Payer: Medicaid Other | Source: Ambulatory Visit | Attending: Gastroenterology | Admitting: Gastroenterology

## 2016-01-31 DIAGNOSIS — K5900 Constipation, unspecified: Secondary | ICD-10-CM

## 2016-01-31 MED ORDER — IOPAMIDOL (ISOVUE-300) INJECTION 61%: 100.0000 mL | Freq: Once | INTRAVENOUS | Status: DC | PRN

## 2016-07-04 ENCOUNTER — Ambulatory Visit (INDEPENDENT_AMBULATORY_CARE_PROVIDER_SITE_OTHER): Payer: Medicare Other | Admitting: Physician Assistant

## 2016-07-04 VITALS — BP 114/72 | HR 82 | Temp 98.7°F | Resp 17 | Ht 64.0 in | Wt 146.0 lb

## 2016-07-04 DIAGNOSIS — N611 Abscess of the breast and nipple: Secondary | ICD-10-CM | POA: Diagnosis not present

## 2016-07-04 DIAGNOSIS — Z0189 Encounter for other specified special examinations: Secondary | ICD-10-CM

## 2016-07-04 DIAGNOSIS — Z7689 Persons encountering health services in other specified circumstances: Secondary | ICD-10-CM

## 2016-07-04 MED ORDER — DOXYCYCLINE HYCLATE 100 MG PO CAPS: 100.0000 mg | ORAL_CAPSULE | Freq: Two times a day (BID) | ORAL | 0 refills | Status: DC

## 2016-07-04 NOTE — Progress Notes (Signed)
[facility] [address], [place] [address] [phone]  Date:  07/04/2016   Name:  Bailey NoeMelinda J Rezendes   DOB:  10/03/1970   MRN:  [id number]  PCP:  Aida PufferLITTLE,JAMES, MD   Chief Complaint  Patient presents with  . Wound Infection    Recent right breast biopsy    History of Present Illness:  Bailey Mcdaniel is a 45 y.o. female patient who [facility] cc of right bump on breast.  Patient had a breast biopsy due to possible abscess, 4 weeks ago.  This was benign.  2 weeks later, she had the sutures removed.  Over the last 2 days, she developed a bump at the procedure site, and increased swelling, redness and pain.  No fever.  No drainage.   Current smoker.  Patient Active Problem List   Diagnosis Date Noted  . Back pain 08/26/2011  . Anxiety and depression 08/26/2011    Past Medical History:  Diagnosis Date  . Anxiety and depression   . Asthma   . Back pain    s/p surgery 8-12  . Lupus   . Migraine     Past Surgical History:  Procedure Laterality Date  . ABDOMINAL HYSTERECTOMY    . BACK SURGERY  03/28/2011  . BACK SURGERY    . BLADDER SURGERY     at time of hysterectomy  . HAND SURGERY  2009   R hand, due to a Fx   . HAND SURGERY    . hsyterectomy and unilateral oophorectomy per patient      Social History  Substance Use Topics  . Smoking status: Current Every Day Smoker    Packs/day: 0.25    Types: Cigarettes  . Smokeless tobacco: Not on file  . Alcohol use Yes    Family History  Problem Relation Age of Onset  . Cancer Mother     type?  Marland Kitchen. Hypertension Mother   . Stroke Mother   . Sudden death Father   . Colon cancer Neg Hx   . Breast cancer Neg Hx   . Diabetes Mother   . CAD Mother   . Heart attack Father     F MI at age 44 and M    No Known Allergies  Medication list has been reviewed and updated.  Current Outpatient Prescriptions on File Prior to Visit  Medication Sig Dispense Refill  . atorvastatin (LIPITOR)  10 MG tablet Take 10 mg by mouth daily.    Marland Kitchen. dicyclomine (BENTYL) 20 MG tablet Take 1 tablet (20 mg total) by mouth 2 (two) times daily. 20 tablet 0  . ibuprofen (ADVIL,MOTRIN) 200 MG tablet Take 400 mg by mouth every 6 (six) hours as needed for moderate pain.    Marland Kitchen. HYDROcodone-acetaminophen (NORCO/VICODIN) 5-325 MG tablet Take 1-2 tablets by mouth every 6 hours as needed for pain and/or cough. (Patient not taking: Reported on 07/04/2016) 7 tablet 0  . methocarbamol (ROBAXIN) 500 MG tablet Take 2 tablets (1,000 mg total) by mouth 4 (four) times daily as needed (Pain). (Patient not taking: Reported on 07/04/2016) 20 tablet 0  . naproxen sodium (ANAPROX) 220 MG tablet Take 440-660 mg by mouth 3 (three) times daily as needed (head or back pain).     No current facility-administered medications on file prior to visit.     ROS ROS otherwise unremarkable unless listed above  Physical Examination: BP (!) 114/7 (BP Location: Right Arm, Patient Position: Sitting, Cuff Size: Large)  Pulse 82   Temp 98.7 F (37.1 C) (Oral)   Resp 17   Ht 5\' 4"  (1.626 m)   Wt 146 lb (66.2 kg)   SpO2 99%   BMI 25.06 kg/m  Ideal Body Weight: Weight in (lb) to have BMI = 25: 145.3  Physical Exam  Constitutional: She is oriented to person, place, and time. She appears well-developed and well-nourished. No distress.  HENT:  Head: Normocephalic and atraumatic.  Right Ear: External ear normal.  Left Ear: External ear normal.  Eyes: Conjunctivae and EOM are normal. Pupils are equal, round, and reactive to light.  Cardiovascular: Normal rate.   Pulmonary/Chest: Effort normal. No respiratory distress.  Neurological: She is alert and oriented to person, place, and time.  Skin: She is not diaphoretic.  Right breast about 2 cm from the aereola border at [facility]oclock region is mildly erythematous swelling.  There is fluctuance and pus like substance underneath epidermis.  No purulent drainage expressed upon palpation.  Very  tender.  Psychiatric: She has a normal mood and affect. Her behavior is normal.   Procedure: verbal consent obtained.  Cleansed with alcohol.  1% lidocaine placed at the abscess superficially and at base.  Swabbed with povidine.  11 blade utilized to place .5cm incision.  Generous purulent fluid expressed.  Searched for loculations.  Minimal drainage with this.  Irrigated with normal saline.  1/4 packing placed in wound depth about .5cm.  Dressings placed.   Assessment and Plan: Bailey Mcdaniel is a 45 y.o. female who is here today for bump on breast. Tolerated well.  abx given.  rtc in 2 days.  Alarming symptoms discussed.  Redness demarcated.  Advised warm compresses.  Smoking risks of wound healing discussed.  Encounter for incision and drainage procedure - Plan: WOUND CULTURE, doxycycline (VIBRAMYCIN) 100 MG capsule  Abscess of breast - Plan: WOUND CULTURE, doxycycline (VIBRAMYCIN) 100 MG capsule   Trena PlattStephanie English, PA-C [facility] 07/04/2016 11:55 AM

## 2016-07-04 NOTE — Patient Instructions (Addendum)
Keep bandage along the area.  Change top dressing daily, leaving packing in place.  You have the option, if the wound does not worsen, of removing the packing in 2 days.  But we can remove and recheck on Monday/tuesday.  You can shower and keep the dressing in place and change the top when you get out the shower, BUT wait 24 hours for this.   Incision and Drainage, Care After Refer to this sheet in the next few weeks. These instructions provide you with information about caring for yourself after your procedure. Your health care provider may also give you more specific instructions. Your treatment has been planned according to current medical practices, but problems sometimes occur. Call your health care provider if you have any problems or questions after your procedure. What can I expect after the procedure? After the procedure, it is common to have:  Pain or discomfort around your incision site.  Drainage from your incision. Follow these instructions at home:  Take over-the-counter and prescription medicines only as told by your health care provider.  If you were prescribed an antibiotic medicine, take it as told by your health care provider.Do not stop taking the antibiotic even if you start to feel better.  Followinstructions from your health care provider about:  How to take care of your incision.  When and how you should change your packing and bandage (dressing). Wash your hands with soap and water before you change your dressing. If soap and water are not available, use hand sanitizer.  When you should remove your dressing.  Do not take baths, swim, or use a hot tub until your health care provider approves.  Keep all follow-up visits as told by your health care provider. This is important.  Check your incision area every day for signs of infection. Check for:  More redness, swelling, or pain.  More fluid or blood.  Warmth.  Pus or a bad smell. Contact a health care  provider if:  Your cyst or abscess returns.  You have a fever.  You have more redness, swelling, or pain around your incision.  You have more fluid or blood coming from your incision.  Your incision feels warm to the touch.  You have pus or a bad smell coming from your incision. Get help right away if:  You have severe pain or bleeding.  You cannot eat or drink without vomiting.  You have decreased urine output.  You become short of breath.  You have chest pain.  You cough up blood.  The area where the incision and drainage occurred becomes numb or it tingles. This information is not intended to replace advice given to you by your health care provider. Make sure you discuss any questions you have with your health care provider. Document Released: 10/12/2011 Document Revised: 12/20/2015 Document Reviewed: 05/10/2015 Elsevier Interactive Patient Education  2017 [employer]    IF you received an x-ray today, you will receive an invoice from [facility]. Please contact [facility] at [phone] with questions or concerns regarding your invoice.   IF you received labwork today, you will receive an invoice from [facility]. Please contact [facility] at [phone] with questions or concerns regarding your invoice.   Our billing staff will not be able to assist you with questions regarding bills from these companies.  You will be contacted with the lab results as soon as they are available. The fastest way to get your results is to activate your My  Chart account. Instructions are located on the last page of this paperwork. If you have not heard from [place] regarding the results in 2 weeks, please contact this office.

## 2016-07-07 ENCOUNTER — Encounter: Payer: Self-pay | Admitting: Physician Assistant

## 2016-07-07 ENCOUNTER — Ambulatory Visit (INDEPENDENT_AMBULATORY_CARE_PROVIDER_SITE_OTHER): Payer: Medicare Other | Admitting: Physician Assistant

## 2016-07-07 VITALS — BP 110/66 | HR 83 | Temp 99.0°F | Resp 18 | Ht 64.0 in | Wt 144.0 lb

## 2016-07-07 DIAGNOSIS — N611 Abscess of the breast and nipple: Secondary | ICD-10-CM

## 2016-07-07 DIAGNOSIS — L732 Hidradenitis suppurativa: Secondary | ICD-10-CM

## 2016-07-07 LAB — WOUND CULTURE: Gram Stain: NONE SEEN

## 2016-07-07 NOTE — Patient Instructions (Signed)
     IF you received an x-ray today, you will receive an invoice from [facility]. Please contact [facility] at [phone] with questions or concerns regarding your invoice.   IF you received labwork today, you will receive an invoice from [facility]. Please contact [facility] at [phone] with questions or concerns regarding your invoice.   Our billing staff will not be able to assist you with questions regarding bills from these companies.  You will be contacted with the lab results as soon as they are available. The fastest way to get your results is to activate your My Chart account. Instructions are located on the last page of this paperwork. If you have not heard from us regarding the results in 2 weeks, please contact this office.      

## 2016-07-07 NOTE — Progress Notes (Signed)
   Bailey NoeMelinda J Mcglothlin  MRN: [id number] DOB: 01/13/1971  Subjective:  Pt presents to clinic for recheck abscess.  She has tolerated the abx ok.  She has a diagnosis of hydradenitis and her dermatologist has started her on humira and she has noticed a decrease in lesions in her groin area since then.  This is in a different spot than she normally gets her lesions.  She had an abscesses opened here a few weeks ago by her dermatologist and was stitched and the one that she currently has started right after the stitches were removed.  The pain is less but there is still some drainage.  Review of Systems  Constitutional: Negative for chills and fever.  Gastrointestinal: Negative for nausea.  Skin: Positive for wound. Negative for rash.    Patient Active Problem List   Diagnosis Date Noted  . Hydradenitis 07/07/2016  . Back pain 08/26/2011  . Anxiety and depression 08/26/2011    Current Outpatient Prescriptions on File Prior to Visit  Medication Sig Dispense Refill  . Adalimumab (HUMIRA) 40 MG/0.8ML PSKT Inject into the skin.    Marland Kitchen. atorvastatin (LIPITOR) 10 MG tablet Take 10 mg by mouth daily.    . clonazePAM (KLONOPIN) 0.5 MG tablet Take 0.5 mg by mouth 2 (two) times daily as needed for anxiety.    Marland Kitchen. lubiprostone (AMITIZA) 8 MCG capsule Take 8 mcg by mouth 2 (two) times daily with a meal.    . HYDROcodone-acetaminophen (NORCO/VICODIN) 5-325 MG tablet Take 1-2 tablets by mouth every 6 hours as needed for pain and/or cough. (Patient not taking: Reported on 07/07/2016) 7 tablet 0  . methocarbamol (ROBAXIN) 500 MG tablet Take 2 tablets (1,000 mg total) by mouth 4 (four) times daily as needed (Pain). (Patient not taking: Reported on 07/07/2016) 20 tablet 0   No current facility-administered medications on file prior to visit.     No Known Allergies  Pt patients past, family and social history were reviewed and updated.   Objective:  BP 110/66 (BP Location: Right Arm, Patient Position: Sitting,  Cuff Size: Small)   Pulse 83   Temp 99 F (37.2 C) (Oral)   Resp 18   Ht 5\' 4"  (1.626 m)   Wt 144 lb (65.3 kg)   SpO2 99%   BMI 24.72 kg/m   Physical Exam  Constitutional: She is oriented to person, place, and time and well-developed, well-nourished, and in no distress.  HENT:  Head: Normocephalic and atraumatic.  Right Ear: Hearing and external ear normal.  Left Ear: Hearing and external ear normal.  Eyes: Conjunctivae are normal.  Neck: Normal range of motion.  Pulmonary/Chest: Effort normal.  Neurological: She is alert and oriented to person, place, and time. Gait normal.  Skin: Skin is warm and dry.  Right breast - slight erythema on outer upper area of abscess - almost no induration surrounding the wound and none under the incision location.  After the packing was removed a small amount of thick white purulence was expressed - this seemed to stuck from the packing - good granulation tissue present at the base of the wound.  Psychiatric: Mood, memory, affect and judgment normal.  Vitals reviewed.   Assessment and Plan :  Hydradenitis  Abscess of breast   Continue and finish the abx - warm compresses - keep covered until the wound is healed over  Benny LennertSarah Jaquetta Currier PA-C  [facility] 07/07/2016 6:21 PM

## 2016-07-15 IMAGING — CT CT HEAD W/O CM
2 series · 16 of 30 positions shown, 19 images · non-contrast
Comparison: None.

CLINICAL DATA: Posterior headache, acute onset.  Initial encounter.

EXAM:
CT HEAD WITHOUT CONTRAST
TECHNIQUE: Contiguous axial images were obtained from the base of the skull
through the vertex without intravenous contrast.

[Series 2: head w/o · axial · non-contrast · 0.41mm/px · z∈[-12,+113]mm · 9 of 33 slices shown, 12 images]
[im 4/33  brain]
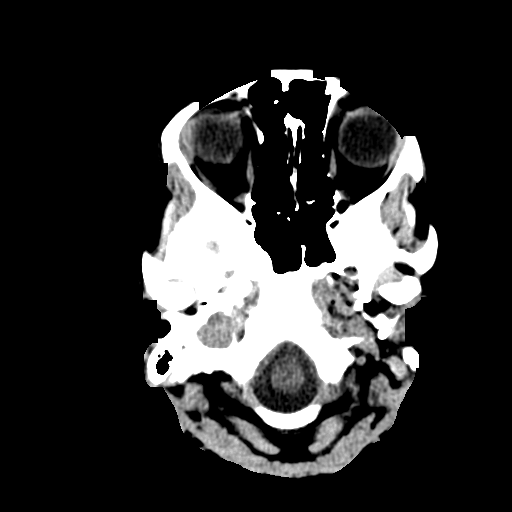
[im 4/33  bone]
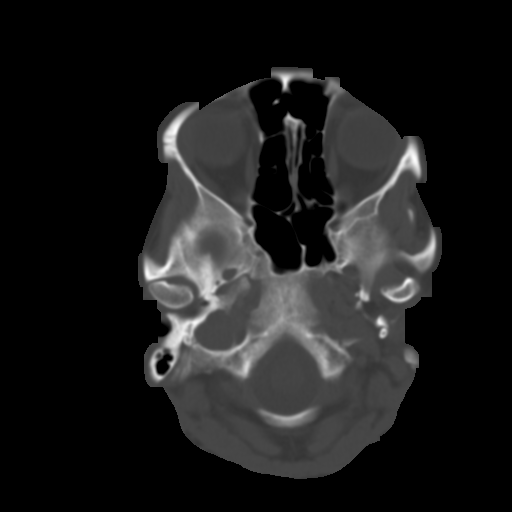
[im 7/33  brain]
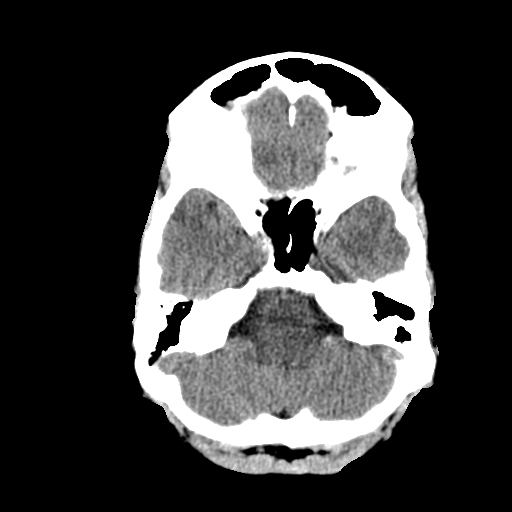
[im 10/33  brain]
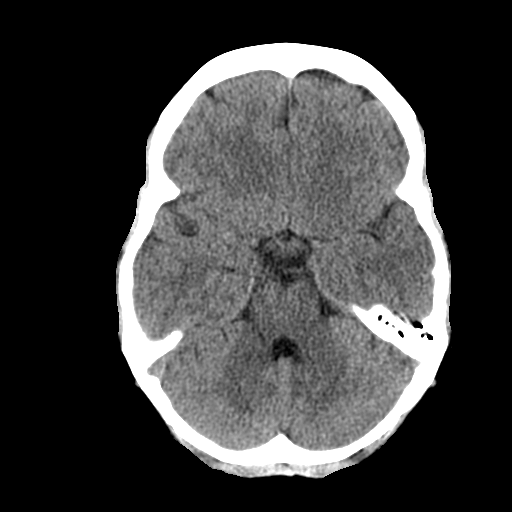
[im 13/33  brain]
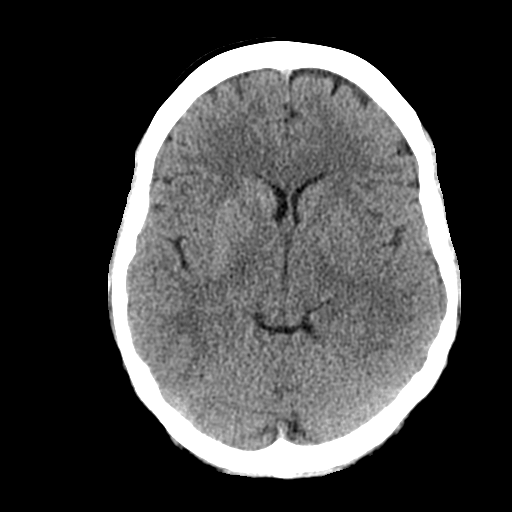
[im 17/33  brain]
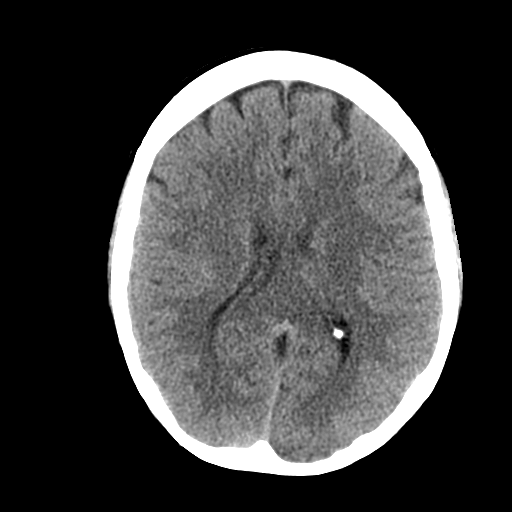
[im 17/33  bone]
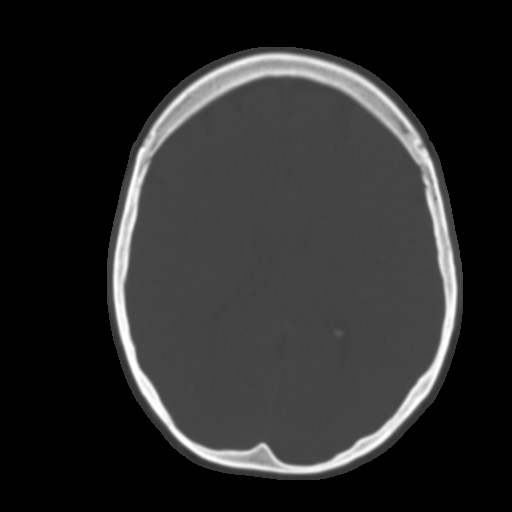
[im 20/33  brain]
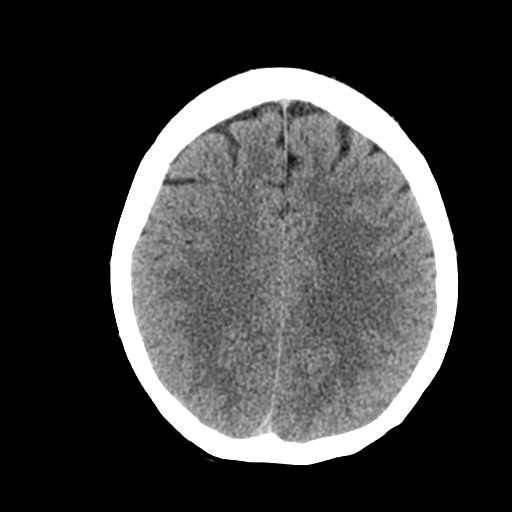
[im 23/33  brain]
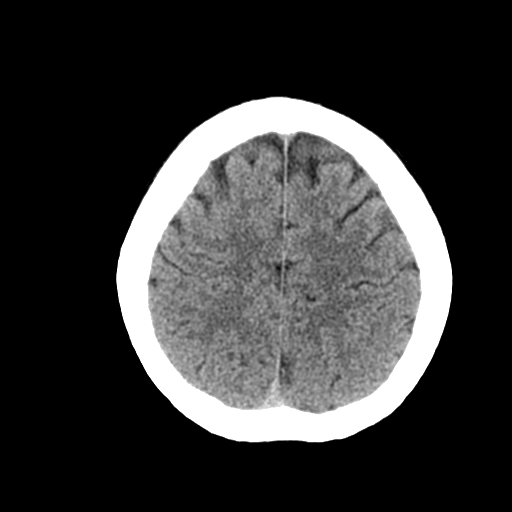
[im 26/33  brain]
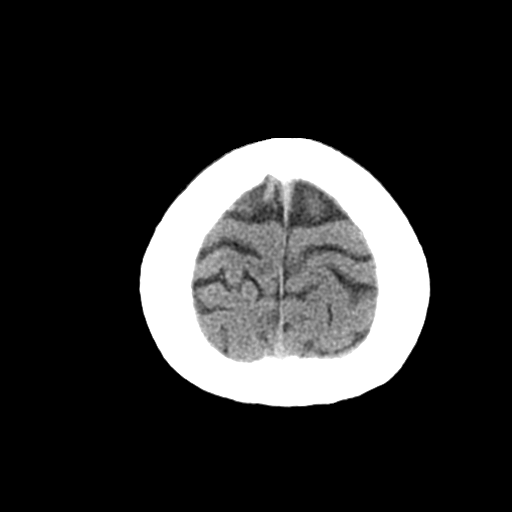
[im 29/33  brain]
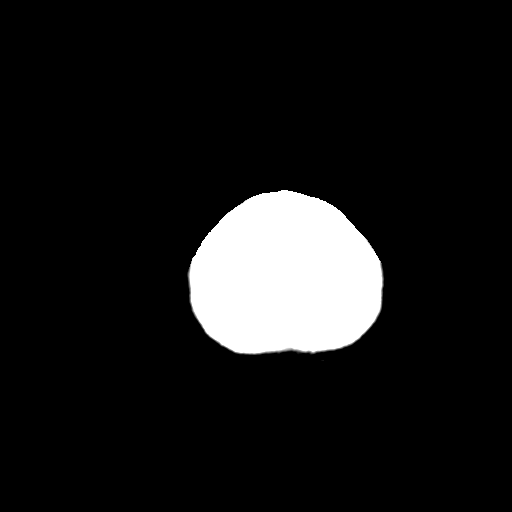
[im 29/33  bone]
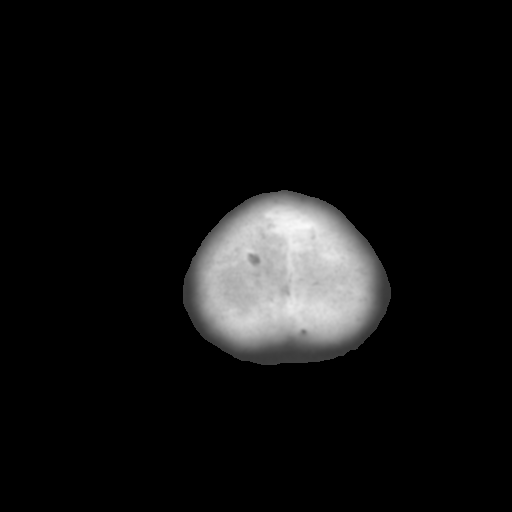

[Series 3: bone windows · axial · 0.41mm/px · z∈[-9,+108]mm · 7 of 59 slices shown]
[im 7/59  bone]
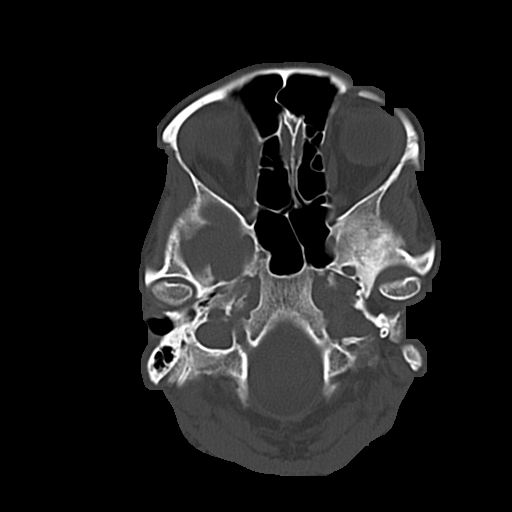
[im 13/59  bone]
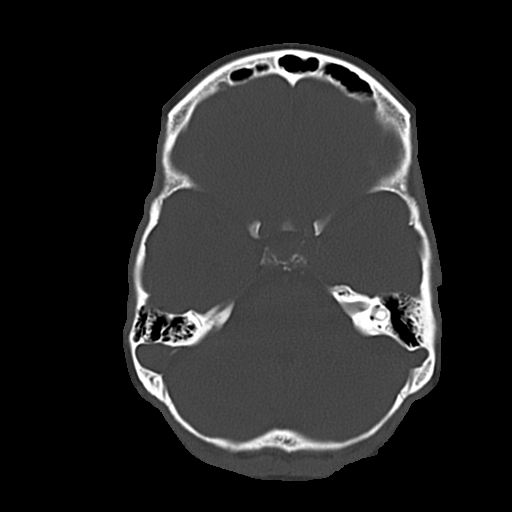
[im 20/59  bone]
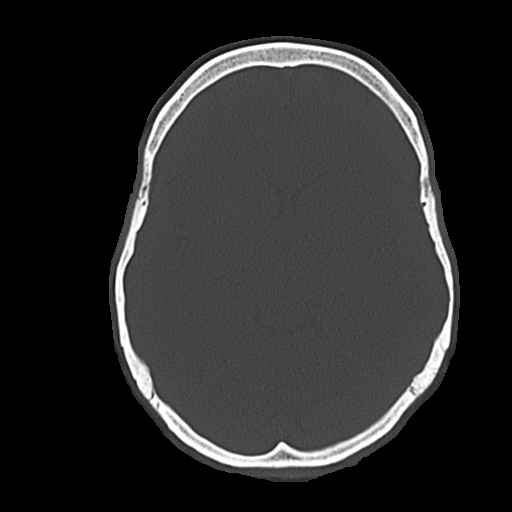
[im 26/59  bone]
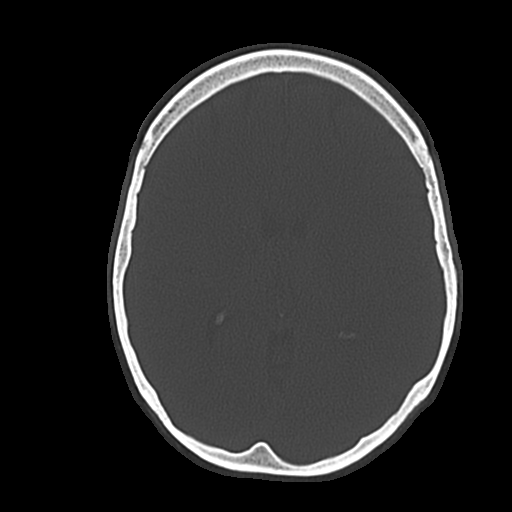
[im 33/59  bone]
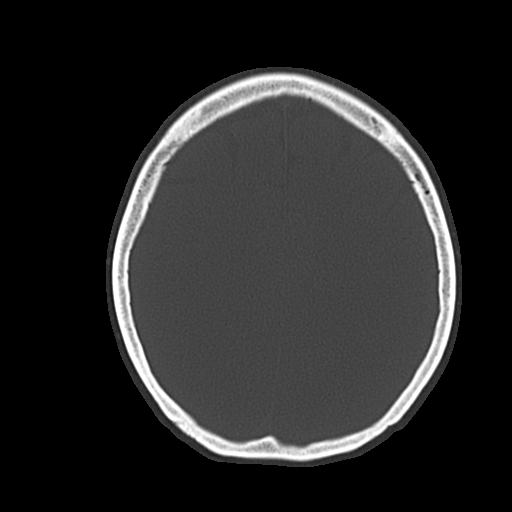
[im 39/59  bone]
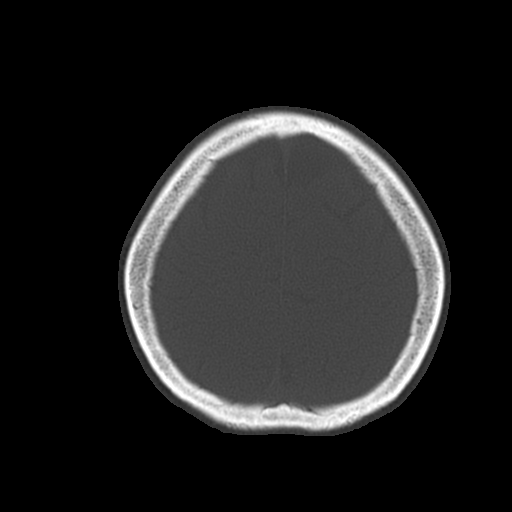
[im 46/59  bone]
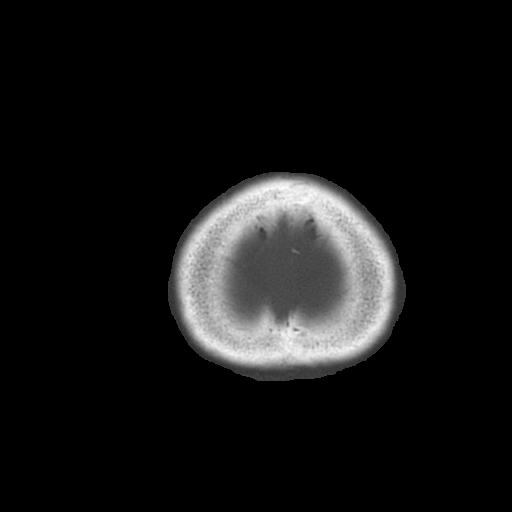

[16 of 30 positions shown; findings below may reference images not displayed]

FINDINGS: There is no evidence of acute infarction, mass lesion, or intra- or
extra-axial hemorrhage on CT.

The posterior fossa, including the cerebellum, brainstem and fourth
ventricle, is within normal limits. The third and lateral
ventricles, and basal ganglia are unremarkable in appearance. The
cerebral hemispheres are symmetric in appearance, with normal
gray-white differentiation. No mass effect or midline shift is seen.

There is no evidence of fracture; visualized osseous structures are
unremarkable in appearance. The visualized portions of the orbits
are within normal limits. The paranasal sinuses and mastoid air
cells are well-aerated. No significant soft tissue abnormalities are
seen.
IMPRESSION: Unremarkable noncontrast CT of the head.

## 2017-02-10 ENCOUNTER — Ambulatory Visit (HOSPITAL_COMMUNITY)
Admission: EM | Admit: 2017-02-10 | Discharge: 2017-02-10 | Disposition: A | Discharge status: Home / Self Care | Payer: Medicare Other | Attending: Family Medicine | Admitting: Family Medicine

## 2017-02-10 ENCOUNTER — Encounter (HOSPITAL_COMMUNITY): Payer: Self-pay | Admitting: Family Medicine

## 2017-02-10 DIAGNOSIS — L03213 Periorbital cellulitis: Secondary | ICD-10-CM | POA: Diagnosis not present

## 2017-02-10 DIAGNOSIS — H00012 Hordeolum externum right lower eyelid: Secondary | ICD-10-CM

## 2017-02-10 MED ORDER — AMOXICILLIN-POT CLAVULANATE 875-125 MG PO TABS
1.0000 | ORAL_TABLET | Freq: Two times a day (BID) | ORAL | 0 refills | Status: DC
Start: 2017-02-10 — End: 2018-08-13

## 2017-02-10 NOTE — ED Provider Notes (Signed)
CSN: [id number]     Arrival date & time 02/10/17  1735 History   None    Chief Complaint  Patient presents with  . Eye Problem   (Consider location/radiation/quality/duration/timing/severity/associated sxs/prior Treatment) Patient states she has a stye on her right eye and she is using polytrim eye gtt's and now her right face and periorbital area is red swollen and tender.   The history is provided by the patient and the spouse.  Eye Problem  Location:  Right eye Quality:  Aching and burning Severity:  Moderate Onset quality:  Sudden Duration:  2 days Timing:  Constant Progression:  Worsening Associated symptoms: discharge   Associated symptoms: no redness     Past Medical History:  Diagnosis Date  . Anxiety and depression   . Asthma   . Back pain    s/p surgery 8-12  . Lupus   . Migraine    Past Surgical History:  Procedure Laterality Date  . ABDOMINAL HYSTERECTOMY    . BACK SURGERY  03/28/2011  . BACK SURGERY    . BLADDER SURGERY     at time of hysterectomy  . HAND SURGERY  2009   R hand, due to a Fx   . HAND SURGERY    . hsyterectomy and unilateral oophorectomy per patient     Family History  Problem Relation Age of Onset  . Sudden death Father   . Heart attack Father        F MI at age 45 and 38M  . Cancer Mother        type?  Marland Kitchen. Hypertension Mother   . Stroke Mother   . Diabetes Mother   . CAD Mother   . Colon cancer Neg Hx   . Breast cancer Neg Hx    Social History  Substance Use Topics  . Smoking status: Current Every Day Smoker    Packs/day: 0.25    Types: Cigarettes  . Smokeless tobacco: Never Used  . Alcohol use Yes   OB History    Gravida Para Term Preterm AB Living   3 3 0 0 0     SAB TAB Ectopic Multiple Live Births   0 0 0         Review of Systems  Constitutional: Negative.   HENT: Negative.   Eyes: Positive for discharge. Negative for redness.  Respiratory: Negative.   Cardiovascular: Negative.   Gastrointestinal: Negative.    Endocrine: Negative.   Genitourinary: Negative.   Musculoskeletal: Negative.   Skin: Positive for rash.    Allergies  Patient has no known allergies.  Home Medications   Prior to Admission medications   Medication Sig Start Date End Date Taking? Authorizing Provider  Adalimumab (HUMIRA) 40 MG/0.8ML PSKT Inject into the skin.    [provider]  amoxicillin-clavulanate (AUGMENTIN) 875-125 MG tablet Take 1 tablet by mouth 2 (two) times daily. 02/10/17   Deatra Canterxford, William J, FNP  atorvastatin (LIPITOR) 10 MG tablet Take 10 mg by mouth daily.    [provider]  clonazePAM (KLONOPIN) 0.5 MG tablet Take 0.5 mg by mouth 2 (two) times daily as needed for anxiety.    [provider]  HYDROcodone-acetaminophen (NORCO/VICODIN) 5-325 MG tablet Take 1-2 tablets by mouth every 6 hours as needed for pain and/or cough. Patient not taking: Reported on 07/07/2016 08/06/15   Pisciotta, Joni ReiningNicole, PA-C  lubiprostone (AMITIZA) 8 MCG capsule Take 8 mcg by mouth 2 (two) times daily with a meal.    [provider]  methocarbamol (ROBAXIN) 500 MG tablet Take 2 tablets (1,000 mg total) by mouth 4 (four) times daily as needed (Pain). Patient not taking: Reported on 07/07/2016 08/06/15   Pisciotta, Joni Reining, PA-C   Meds Ordered and Administered this Visit  Medications - No data to display  BP 100/81   Pulse 71   Temp 98.5 F (36.9 C) (Oral)   Resp 18   SpO2 100%  No data found.   Physical Exam  Constitutional: She appears well-developed and well-nourished.  HENT:  Head: Normocephalic and atraumatic.  Right Ear: External ear normal.  Left Ear: External ear normal.  Mouth/Throat: Oropharynx is clear and moist.  Eyes: Conjunctivae and EOM are normal. Pupils are equal, round, and reactive to light.  Right periorbital region with swelling and redness.  Internal stye right lower eye lid.  Neck: Normal range of motion. Neck supple.  Cardiovascular: Normal rate, regular rhythm and  normal heart sounds.   Pulmonary/Chest: Effort normal and breath sounds normal.  Nursing note and vitals reviewed.   Urgent Care Course     Procedures (including critical care time)  Labs Review Labs Reviewed - No data to display  Imaging Review No results found.   Visual Acuity Review  Right Eye Distance:   Left Eye Distance:   Bilateral Distance:    Right Eye Near:   Left Eye Near:    Bilateral Near:         MDM   1. Periorbital cellulitis of right eye   2. Hordeolum of right lower eyelid, unspecified hordeolum type    Augmentin 875 mg one po bid x 10 days #20 Continue Polytrim eye gtt's      Deatra Canter, FNP 02/10/17 1901

## 2017-02-10 NOTE — ED Triage Notes (Signed)
Pt here for stye and swelling around the right eye. Sts blurry vision.

## 2017-02-17 NOTE — Unmapped (Signed)
[facility] Specialty Pharmacy Refill and Clinical Coordination Note  Medication(s): Humira    Krista Williamson, DOB: 09-24-1970  Phone: [phone] (home) , Alternate phone contact: N/A  Shipping address: [address]  [place]  [address]  Phone or address changes today?: No  All above HIPAA information verified.  Insurance changes? No    Completed refill and clinical call assessment today to schedule patient's medication shipment from the [facility] [phone]).      MEDICATION RECONCILIATION    Confirmed the medication and dosage are correct and have not changed: Yes, regimen is correct and unchanged.    Were there any changes to your medication(s) in the past month:  No, there are no changes reported at this time.    ADHERENCE    Is this medicine transplant or covered by Medicare Part B? No.    Did you miss any doses in the past 4 weeks? No missed doses reported.  Adherence counseling provided? Not needed     SIDE EFFECT MANAGEMENT    Are you tolerating your medication?:  Krista Williamson reports tolerating the medication.  Side effect management discussed: None      Therapy is appropriate and should be continued.    Evidence of clinical benefit: See Epic note from 12/21/16      FINANCIAL/SHIPPING    Delivery Scheduled: Yes, Expected medication delivery date: Thurs, July 26   Additional medications refilled: No additional medications/refills needed at this time.    Krista Williamson did not have any additional questions at this time.    Delivery address validated in [facility] scheduling system: Yes, address listed above is correct.      We will follow up with patient monthly for standard refill processing and delivery.      Thank you,  Tawanna Solo [facility] Specialty Pharmacist

## 2017-02-24 MED FILL — HUMIRA PF PEN (BOX)/40MG/0.8mL/PEN: HUMIRA PF PEN (BOX)/40MG/0.8mL/PEN | 28 days supply | Qty: 2 | Fill #2

## 2017-03-15 NOTE — Unmapped (Signed)
Specialty Pharmacy Refill Coordination Note     Krista Williamson is a 46 y.o. female contacted today regarding refills of her specialty medication(s).    Reviewed and verified with patient:     [address]  [place] [address]    Specialty medication(s) and dose(s) confirmed: yes  Changes to medications: no  Changes to insurance: no    Medication Adherence    Patient reported X missed doses in the last month:  0  Specialty Medication:  HUMIRA  Medication Assistance Program  Refill Coordination  Has the Patient's Contact Information Changed:  No  Is the Shipping Address Different:  No  Shipping Information  Delivery Scheduled:  Yes  Delivery Date:  03/25/17  Medications to be Shipped:  HUMIRA          Marzetta Board  Specialty Pharmacy Technician

## 2017-03-24 MED FILL — HUMIRA PF PEN (BOX)/40MG/0.8mL/PEN: HUMIRA PF PEN (BOX)/40MG/0.8mL/PEN | 28 days supply | Qty: 2 | Fill #3

## 2017-03-25 NOTE — Unmapped (Signed)
-----   Message from Sula Rumple sent at 03/15/2017  9:56 AM EDT -----  Regarding: RE: continuity clinic surgery appt  Contact: [phone]  Hi Dr.Ciriack,    I spoke with the patient this morning, she declined to schedule at this time due to questions and concerns of the HS is now coming up on different area on her body. She has notice spots on her breast and buttock. She expressed now is not a good time due to family issues of now having her grand kids and not getting rest.    She asked if you or Dr.Culton could please give her a call.    ----- Message -----  From: Kathleen Lime, MD  Sent: 03/12/2017   4:50 PM  To: Sula Rumple  Subject: continuity clinic surgery appt                   Hi Robin,    Will you please reach out to this patient and see if she is still interested in having a procedure for her HS of her groin as I discussed with her at her most recent visit? If so, will you please schedule her for a 1-hr surgery slot in my continuity clinic (specifically on a day I am there)?    Thanks!    Caryn Bee

## 2017-03-25 NOTE — Unmapped (Signed)
Called and left VM for patient, as well as sent her a MyChart message checking in to see how things are doing. Requested that she call back or respond via MyChart.

## 2017-04-14 NOTE — Unmapped (Signed)
Specialty Pharmacy Refill Coordination Note     Krista Williamson is a 46 y.o. female contacted today regarding refills of her specialty medication(s).    Reviewed and verified with patient:      Specialty medication(s) and dose(s) confirmed: yes  Changes to medications: no  Changes to insurance: no    Medication Adherence    Patient reported X missed doses in the last month:  0  Specialty Medication:  HUMIRA         Refill Coordination    Has the Patients' Contact Information Changed:  No  Is the Shipping Address Different:  No       Shipping Information    Delivery Scheduled:  Yes  Delivery Date:  04/22/17  Medications to be Shipped:  HUMIRA          Krista Williamson  Specialty Pharmacy Technician

## 2017-04-20 MED FILL — HUMIRA PF PEN (BOX)/40MG/0.8mL/PEN: HUMIRA PF PEN (BOX)/40MG/0.8mL/PEN | 28 days supply | Qty: 2 | Fill #4

## 2017-05-28 NOTE — Unmapped (Signed)
[facility] Specialty Pharmacy Refill Coordination Note  Specialty Medication(s): HUMIRA  Additional Medications shipped: NONE    Krista Williamson, DOB: 1971/03/24  Phone: [phone] (home) , Alternate phone contact: N/A  Phone or address changes today?: No  All above HIPAA information was verified with patient.  Shipping Address: [address]  [place] [address]   Insurance changes? No    Completed refill call assessment today to schedule patient's medication shipment from the [facility] [phone]).      Confirmed the medication and dosage are correct and have not changed: Yes, regimen is correct and unchanged.    Confirmed patient started or stopped the following medications in the past month:  No, there are no changes reported at this time.    Are you tolerating your medication?:  Krista Williamson reports tolerating the medication.    ADHERENCE    (Below is required for Medicare Part B or Transplant patients only - per drug):   How many tablets were dispensed last month: 4  Patient currently has 0 remaining.    Did you miss any doses in the past 4 weeks? No missed doses reported.    FINANCIAL/SHIPPING    Delivery Scheduled: Yes, Expected medication delivery date: 06/01/2017     Krista Williamson did not have any additional questions at this time.    Delivery address validated in [facility]SI scheduling system: Yes, address listed in FSI is correct.    We will follow up with patient monthly for standard refill processing and delivery.      Thank you,  Dorothea Ogle   [facility] Specialty Technician

## 2017-05-30 MED FILL — HUMIRA PF PEN (BOX)/40MG/0.8mL/PEN: HUMIRA PF PEN (BOX)/40MG/0.8mL/PEN | 28 days supply | Qty: 2 | Fill #5

## 2017-06-21 MED ORDER — ADALIMUMAB 40 MG/0.8 ML SUBCUTANEOUS PEN KIT
SUBCUTANEOUS | 0 refills | 0.00000 days | Status: CP
Start: 2017-06-21 — End: 2017-06-21

## 2017-06-21 MED ORDER — ADALIMUMAB 40 MG/0.8 ML SUBCUTANEOUS PEN KIT: each | 0 refills | 0 days

## 2017-06-21 MED ORDER — HUMIRA PEN 40 MG/0.8 ML SUBCUTANEOUS KIT
SUBCUTANEOUS | 0 refills | 0.00000 days | Status: CP
Start: 2017-06-21 — End: 2017-06-21

## 2017-06-21 MED ORDER — ADALIMUMAB 40 MG/0.8 ML SUBCUTANEOUS PEN KIT: each | 0 refills | 0 days | Status: AC

## 2017-06-21 NOTE — Unmapped (Signed)
[facility] Specialty Pharmacy Refill and Clinical Coordination Note  Medication(s): Humira    Krista Williamson, DOB: 1970/09/18  Phone: [phone] (home) , Alternate phone contact: N/A  Shipping address: [address]  [place] [address]  Phone or address changes today?: No  All above HIPAA information verified.  Insurance changes? No    Completed refill and clinical call assessment today to schedule patient's medication shipment from the [facility] [phone]).      MEDICATION RECONCILIATION    Confirmed the medication and dosage are correct and have not changed: Yes, regimen is correct and unchanged.    Were there any changes to your medication(s) in the past month:  No, there are no changes reported at this time.    ADHERENCE    Is this medicine transplant or covered by Medicare Part B? No.    Did you miss any doses in the past 4 weeks? No missed doses reported.  Adherence counseling provided? Not needed     SIDE EFFECT MANAGEMENT    Are you tolerating your medication?:  Ramia reports tolerating the medication.  Side effect management discussed: None      Therapy is appropriate and should be continued.    Evidence of clinical benefit: See Epic note from 12/21/16      FINANCIAL/SHIPPING    Delivery Scheduled: Yes, Expected medication delivery date: Tues, Nov 28   Additional medications refilled: No additional medications/refills needed at this time.    Marialice did not have any additional questions at this time.    Delivery address validated in [facility]SI scheduling system: Yes, address listed above is correct.      We will follow up with patient monthly for standard refill processing and delivery.      Thank you,  Tawanna Solo Shared [facility] Specialty Pharmacist

## 2017-06-22 NOTE — Unmapped (Signed)
Provided one time refill of humira.  Patient will need appt for quant gold before I can give more.  Message sent to call center to schedule

## 2017-06-22 NOTE — Unmapped (Signed)
Appointment 07/16/17 at 1:30 pm with Dr Orma Flaming in [place]. Dr Dreama Saa can you contact patient? Questions about new boils appearing on her breast now.  Thanks

## 2017-06-29 MED FILL — HUMIRA PF PEN (BOX)/40MG/0.8mL/PEN: HUMIRA PF PEN (BOX)/40MG/0.8mL/PEN | 28 days supply | Qty: 2 | Fill #0

## 2017-07-16 ENCOUNTER — Ambulatory Visit
Admission: RE | Admit: 2017-07-16 | Discharge: 2017-07-16 | Payer: MEDICARE | Attending: Dermatology | Admitting: Dermatology

## 2017-07-16 DIAGNOSIS — L732 Hidradenitis suppurativa: Principal | ICD-10-CM

## 2017-07-16 DIAGNOSIS — Z5181 Encounter for therapeutic drug level monitoring: Secondary | ICD-10-CM

## 2017-07-16 DIAGNOSIS — R52 Pain, unspecified: Secondary | ICD-10-CM

## 2017-07-16 NOTE — Unmapped (Signed)
ASSESSMENT/PLAN:    Hidradenitis suppurativa primarily affecting inguinal folds/groin, overall improved on adalimumab therapy however with persistently recurring painful inflammatory nodules to the inguinal/perineal region, in setting of patient with dx Crohn's disease  - Discussed management options in detail including risks, benefits, and alternatives; discussed continuing current therapy with use of short to medium term antibiotic therapy as needed for flares, however patient feels that most of her benefit has come from Humira therapy rather than antibiotics in the past. She is not a good candidate for oral contraceptive therapy given active smoking, 1/2 PPD, down from 3-4 PPD in the past.   A joint decision was made to proceed with plan as below:    Despite overall improvement on Humira at standard HS dosing of 40mg  SQ weekly, patient has persistent break through lesions that, when present, are extremely painful and debilitating. She has had improved control of symptoms when temporarily doubling her dose of Humira to 80mg  weekly. Therefore, we will plan to increase Humira to 80mg  SQ weekly until disease activity more fully controlled.   Krista Williamson today    RTC: 74mo    SUBJECTIVE:    CC: Hidradenitis Suppurativa    Krista Williamson is a 46 y.o. female who presents today in follow up for hidradenitis suppurativa, last seen by Dr. Orma Flaming 12/2015 and supposed to follow-up in July for an unroofing procedure. However, pt declined procedure as she has continued to develop new lesions that occur in new areas of the body, manifesting and painful nodules and sometimes abscesses that are exquisitely painful, last flare about 1 month ago. Primarily involving inguinal folds and buttocks, however with occasional lesions to breasts. Has also had two styes   recently and saw ophthalmology, also on Keflex per PCP Dr. Clarene Duke for this problem currently and slightly improving.    History:    Disease course:  Age when symptoms first noticed: 77  Date of diagnosis: 2016  Location of first symptoms: Groin  Typical involved areas include: Groin and under right breast  Typical number of inflammatory lesions each month at baseline: 4  Disease triggers: Nothing  Family History: None    Relationship to menses: No  Current form of contraception: Hysterectomy and unilateral oopohorectomy  Response to previous hormonal therapy or contraception: No    Prior treatments:  Topical: None  Systemic: Clindamycin/rifampin, doxycyline, prednisone, all with little relief  Procedures: Four I&Ds  ED visits? Yes - 5    ROS: Otherwise, well.  NO other complaints.    OBJECTIVE:   Gen: Well-appearing patient, appropriate, interactive, in no acute distress  Skin: Per patient, request, focused exam of face, arms, abdomen, legs, and external genitalia performed and notable for:    - superficial crusting with underlying nodule to the L breast  - clustered scars to the L > R inguinal regions to superficial sinus tract to L inguinal fold  - R palpebral conjunctiva with erythema medially without inflammatory nodule    - All other areas examined were normal or had no significant findings    The patient was seen and examined by Dr. Inis Sizer, who agrees with the assessment and plan as above.

## 2017-07-19 NOTE — Unmapped (Signed)
I saw and evaluated the patient, participating in the key portions of the service.  I reviewed the resident???s note.  I agree with the resident???s findings and plan.    Essie Christine, MD

## 2017-07-21 LAB — QUANTIFERON TB GOLD
Lab: NEGATIVE
QUANTIFERON TB GOLD: NEGATIVE
QUANTIFERON TB1 AG VALUE: 0.08 [IU]/mL
QUANTIFERON TB2 AG VALUE: 0.08 [IU]/mL

## 2017-07-21 MED ORDER — ADALIMUMAB 40 MG/0.8 ML SUBCUTANEOUS PEN KIT: each | 7 refills | 0 days

## 2017-07-21 MED ORDER — ADALIMUMAB 40 MG/0.8 ML SUBCUTANEOUS PEN KIT
SUBCUTANEOUS | 7 refills | 0.00000 days | Status: CP
Start: 2017-07-21 — End: 2018-05-09

## 2017-07-21 NOTE — Unmapped (Signed)
My chart message sent with negative TB results, prescription for adalimumab 80 mg every week sent to [facility] pharmacy.

## 2017-07-21 NOTE — Unmapped (Signed)
[facility] Refill and Clinical Coordination Note  Medication(s): Humira 40mg /0.71ml    Lindell Noe, DOB: Jan 24, 1971  Phone: [phone] (home) , Alternate phone contact: N/A  Shipping address: [address]  [place] [address]  Phone or address changes today?: No  All above HIPAA information verified.  Insurance changes? No    Completed refill and clinical call assessment today to schedule patient's medication shipment from the [facility] [phone]).      MEDICATION RECONCILIATION    Confirmed the medication and dosage are correct and have not changed: No, patient reports changes to the regimen as follows: dose increase to 2 pens (80mg ) subcut q7days    Were there any changes to your medication(s) in the past month:  No, there are no changes reported at this time.    ADHERENCE    Is this medicine transplant or covered by Medicare Part B? No.    Did you miss any doses in the past 4 weeks? No missed doses reported.  Adherence counseling provided? Not needed     SIDE EFFECT MANAGEMENT    Are you tolerating your medication?:  Tanayah reports tolerating the medication.  Side effect management discussed: None      Therapy is appropriate and should be continued.    Evidence of clinical benefit: See Epic note from 07/16/17      FINANCIAL/SHIPPING    Delivery Scheduled: Yes, Expected medication delivery date: 07/23/17   Additional medications refilled: No additional medications/refills needed at this time.    Avrie had the following additional questions or need for a follow-up phone call from the Clinical Pharmacy Team: The patient asked about her Keflex tx by her PCP office, we relayed from the Derm clinic that she should direct all questions re: that med to the original prescriber    Delivery address validated in [facility] scheduling system: Yes, address listed above is correct.      We will follow up with patient monthly for standard refill processing and delivery. Thank you,  Lupita Shutter   [facility] Specialty Pharmacist

## 2017-07-22 MED FILL — HUMIRA PF PEN (BOX)/40MG/0.8mL/PEN: HUMIRA PF PEN (BOX)/40MG/0.8mL/PEN | 28 days supply | Qty: 4 | Fill #0

## 2017-08-12 NOTE — Unmapped (Signed)
[facility] Pharm[facility] Refill Coordination Note  Specialty Medication(s): Humira  Additional Medications shipped: na    Krista Williamson, DOB: 10/31/1970  Phone: [phone] (home) , Alternate phone contact: N/A  Phone or address changes today?: No  All above HIPAA information was verified with patient.  Shipping Address: [address]  [place] [address]   Insurance changes? No    Completed refill call assessment today to schedule patient's medication shipment from the [facility] [phone]).      Confirmed the medication and dosage are correct and have not changed: Yes, regimen is correct and unchanged.    Confirmed patient started or stopped the following medications in the past month:  No, there are no changes reported at this time.    Are you tolerating your medication?:  Krista Williamson reports tolerating the medication.    ADHERENCE    Did you miss any doses in the past 4 weeks? No missed doses reported.    FINANCIAL/SHIPPING    Delivery Scheduled: Yes, Expected medication delivery date: Friday, Jan 18     Krista Williamson did not have any additional questions at this time.    Delivery address validated in FSI scheduling system: Yes, address listed in FSI is correct.    We will follow up with patient monthly for standard refill processing and delivery.      Thank you,  Tawanna Solo [facility] Specialty Pharmacist

## 2017-08-19 MED FILL — HUMIRA PF PEN (BOX)/40MG/0.8mL/PEN: HUMIRA PF PEN (BOX)/40MG/0.8mL/PEN | 28 days supply | Qty: 4 | Fill #1

## 2017-09-09 NOTE — Unmapped (Signed)
[facility] Pharm[facility] Refill Coordination Note  Specialty Medication(s): HUMIRA  Additional Medications shipped: NO    Krista Williamson, DOB: 1971/02/23  Phone: [phone] (home) , Alternate phone contact: N/A  Phone or address changes today?: No  All above HIPAA information was verified with patient.  Shipping Address: [address]  [place] [address]   Insurance changes? No    Completed refill call assessment today to schedule patient's medication shipment from the [facility] [phone]).      Confirmed the medication and dosage are correct and have not changed: Yes, regimen is correct and unchanged.    Confirmed patient started or stopped the following medications in the past month:  No, there are no changes reported at this time.    Are you tolerating your medication?:  Krista Williamson reports tolerating the medication.    ADHERENCE    Did you miss any doses in the past 4 weeks? No missed doses reported.    FINANCIAL/SHIPPING    Delivery Scheduled: Yes, Expected medication delivery date: 09/17/17     Krista Williamson did not have any additional questions at this time.    Delivery address validated in FSI scheduling system: Yes, address listed in FSI is correct.    We will follow up with patient monthly for standard refill processing and delivery.      Thank you,  Marletta Lor   [facility] Specialty Pharmacist

## 2017-09-16 MED FILL — HUMIRA PF PEN (BOX)/40MG/0.8mL/PEN: HUMIRA PF PEN (BOX)/40MG/0.8mL/PEN | 28 days supply | Qty: 4 | Fill #2

## 2017-10-06 NOTE — Unmapped (Signed)
[facility] Pharmacy Refill Coordination Note  Specialty Medication(s): HUMIRA  Additional Medications shipped: N/A    Krista Williamson, DOB: August 14, 1970  Phone: [phone] (home) , Alternate phone contact: N/A  Phone or address changes today?: No  All above HIPAA information was verified with patient.  Shipping Address: [address]  [place] [address]   Insurance changes? No    Completed refill call assessment today to schedule patient's medication shipment from the [facility] [phone]).      Confirmed the medication and dosage are correct and have not changed: Yes, regimen is correct and unchanged.    Confirmed patient started or stopped the following medications in the past month:  No, there are no changes reported at this time.    Are you tolerating your medication?:  Ninetta reports tolerating the medication.    ADHERENCE    Patient has 2 pens left for this Friday. Shipping next box 3/13 to be used on 3/15.    Did you miss any doses in the past 4 weeks? No missed doses reported.    FINANCIAL/SHIPPING    Delivery Scheduled: Yes, Expected medication delivery date: 10/13/17     The patient will receive an FSI print out for each medication shipped and additional FDA Medication Guides as required.  Patient education from [place] or Robet Leu may also be included in the shipment    [place] did not have any additional questions at this time.    Delivery address validated in FSI scheduling system: Yes, address listed in FSI is correct.    We will follow up with patient monthly for standard refill processing and delivery.      Thank you,  Renette Butters   [facility] Specialty Technician

## 2017-10-12 MED FILL — HUMIRA PF PEN (BOX)/40MG/0.8mL/PEN: HUMIRA PF PEN (BOX)/40MG/0.8mL/PEN | 28 days supply | Qty: 4 | Fill #3

## 2017-10-15 ENCOUNTER — Ambulatory Visit: Admit: 2017-10-15 | Discharge: 2017-10-16 | Payer: MEDICARE

## 2017-10-15 DIAGNOSIS — L732 Hidradenitis suppurativa: Principal | ICD-10-CM

## 2017-10-15 DIAGNOSIS — Z79899 Other long term (current) drug therapy: Secondary | ICD-10-CM

## 2017-10-15 MED ORDER — CLINDAMYCIN HCL 300 MG CAPSULE
ORAL_CAPSULE | Freq: Two times a day (BID) | ORAL | 2 refills | 0.00000 days | Status: CP
Start: 2017-10-15 — End: 2017-10-25

## 2017-10-15 MED ORDER — RIFAMPIN 300 MG CAPSULE
ORAL_CAPSULE | Freq: Two times a day (BID) | ORAL | 2 refills | 0 days | Status: CP
Start: 2017-10-15 — End: 2018-04-18

## 2017-10-15 NOTE — Unmapped (Signed)
ASSESSMENT/PLAN:    Hidradenitis suppurativa primarily affecting inguinal folds/groin and breasts, overall improved on adalimumab therapy, in setting of patient with dx Crohn's disease  - She continues to get sporadic new lesions but it sounds like these are mostly mildly inflammatory (vulva) with a few non-inflammatory lesions as well (breast) on exam today, likely secondary to benefit from Humira.   - She does report feeling slightly worse off of keflex  - Continue Humira 80mg  SQ weekly given level of control today  - Discussed for her few remaining lesions we can re-discuss unroofing procedures in the future but she would like to hold off for now  - She would like to get better control, discussed option of infliximab infusions but given she is mostly well controlled today would not push for this yet   - After discussion she would like to try 3 months of clindamycin and rifampin given improvement she felt with abx previously  - Start clindamcyin 300mg  PO BID and rifamipin 300mg  PO BID  - Quant gold negative 07/2017      RTC: 50mo with Sayed or Bissette per patient request    SUBJECTIVE:    CC: Hidradenitis Suppurativa    Krista Williamson is a 47 y.o. female who presents today in follow up for hidradenitis suppurativa, last seen by Dr. Orma Flaming 07/2017. Last visit humira was increased to 80mg  weekly. Also continued on keflex with her PCP but this has since been stopped. She reports she hasn't noticed much change since increasing humira to 80mg  weekly. Still gets occasional new nodules, most recently one in her left inguinal fold. Only getting new spots every few weeks that are painful and interfere with her ability to do her daily functions. Once they drain they mostly stay calm or resolve. Denies any sinuses. Mostly just tender bumps that she is able to express purulence from. Tolerating humira well without injection site reactions.     History:    Disease course:  Age when symptoms first noticed: 71  Date of diagnosis: 2016  Location of first symptoms: Groin  Typical involved areas include: Groin and under right breast  Typical number of inflammatory lesions each month at baseline: 4  Disease triggers: Nothing  Family History: None    Relationship to menses: No  Current form of contraception: Hysterectomy and unilateral oopohorectomy  Response to previous hormonal therapy or contraception: No    Prior treatments:  Topical: None  Systemic: Clindamycin/rifampin, doxycyline, prednisone, all with little relief  Procedures: Four I&Ds  ED visits? Yes - 5    ROS: All other systems reviewed are negative.       OBJECTIVE:   Gen: Well-appearing patient, appropriate, interactive, in no acute distress  Skin: Per patient, request, focused exam of face, arms, abdomen, legs, and external genitalia performed and notable for:  - Left inframammary with soft nodule with overlying crust; when crust removed able to easily express caseous material  - Groin with 3 non-inflamed nodules in left inguinal crease onto labia majora  - All other areas examined were normal or had no significant findings    The patient was seen and examined by Dr. Inis Sizer, who agrees with the assessment and plan as above.

## 2017-10-31 NOTE — Unmapped (Signed)
Returned page and discussed with Krista Williamson her concern that she has a worsening boil in the vulvar region despite re-starting dual antibiotic therapy at [facility] 3/15. She reports the boil has become progressively more painful to where she is having difficulty walking, and that it has opened but not fully drained. She is agreeable with recommendation to look for an add-on clinic appointment early this week, at which time we can consider an I&D if appropriate.

## 2017-11-01 ENCOUNTER — Encounter: Admit: 2017-11-01 | Discharge: 2017-11-02 | Payer: MEDICARE | Attending: Dermatology | Primary: Dermatology

## 2017-11-01 DIAGNOSIS — L732 Hidradenitis suppurativa: Principal | ICD-10-CM

## 2017-11-01 MED ORDER — CLINDAMYCIN HCL 300 MG CAPSULE
ORAL_CAPSULE | 3 refills | 0 days | Status: CP
Start: 2017-11-01 — End: 2018-04-18

## 2017-11-01 NOTE — Unmapped (Signed)
Dermatology      Please STOP cephalexin antibiotic. Please START clindamycin 300 mg twice a day with Rifampin 300 mg twice a day

## 2017-11-02 NOTE — Unmapped (Signed)
ASSESSMENT/PLAN:    Hidradenitis suppurativa-inguinal folds/mons pubis/breasts. [place] Stage II. Still with symptomatic flares despite increase of Humira 80 mg   - Stop keflex and start clindamycin 300 mg BID; continue rifampin 300 mg BID  - Continue Humira 80mg  SQ weekly   - Aerobic culture from today's flare performed; I will let her know about results   - Kenalog 10 mg/mL injected to both lesions today (see procedure note below)  - If not improved with addition of clindamycin to abx regimen, discussed use of infliximab infusions to better control disease (she is okay with coming to [facility] for infusions)  - Will aim to have her see Dr Janyth Contes [facility]) for evaluation of de-roofing procedure; laser hair removal   - Patient has significantly cut down on tobacco use (2-3 packs per day to 1/2 pack per day-now). Reviewed tobacco cessation as this will helpful for overall health and HS  - Quant gold negative 07/2017    Kenalog injection procedure note:  After verbal consent was obtained, the respective cutaneous areas/lesions were prepped with alcohol and then Kenalog 10 mg/cc was injected intralesionally using a total volume of 0.5 cc dispersed between the areas/lesions. The patient tolerated the procedure well and was given instructions on post-procedure care. Total number of injections: 1-7    RTC: 3 mo with Janyth Contes     CC: HS flare     HPI: Krista Williamson is a 47 y.o. female with history of Crohn's disease who presents today for HS flare in R inguinal fold. Notes extreme pain in area beginning this weekend which required her to remain in bed. Pain has slightly subsided as she has been trying to drain lesion. HS regimen:80 mg weekly Humira; cephalexin and rifampin. Denies sig improvement since increasing Humira from 40 to 80 mg weekly. Still having painful lesions that affect quality of life. She was treated with clindamycin for styes on eyelids but was unable to have physician extend prescription for HS lesions. She is taking rifampin and cephalexin daily. Did not start clindamycin but denies having an allergy or significant effect after having medication.     Social history: smokes 1/2 ppd     ROS: All other systems reviewed are negative.       OBJECTIVE:   Gen: Well-appearing patient, appropriate, interactive, in no acute distress  Skin: Per patient, request, focused exam of face, arms, abdomen, legs, and external genitalia performed and notable for:  - Right inguinal fold with 2.5 cm nodule with central punctum; only scantly fluctuant at this time; granulation tissue seen within cavity  - Firm scarred nodule on L inguinal fold with peripheral open comedones seen in area    The patient was seen and examined by Dr. Inis Sizer, who agrees with the assessment and plan as above.

## 2017-11-04 ENCOUNTER — Encounter: Payer: Self-pay | Admitting: *Deleted

## 2017-11-05 NOTE — Unmapped (Signed)
Informed patient that culture results c/w skin flora. Reports continued pain from draining nodule on Rt inguinal fold. Advised that she can take Ibuprofen as needed for pain.

## 2017-11-15 NOTE — Unmapped (Signed)
Patient did not take her dose on 11/09/17-says she just completely forgot, she will be taking it as normal tomorrow on Tuesday 4/16    [facility] Refill Coordination Note    Specialty Program and Medication(s) to be Shipped: humira  Other medications to be shipped: n/a     IVELISE CASTILLO, DOB: 28-May-1971  Phone: [phone] (home)   Shipping Address: [address]  [place] [address]  Address confirmed in FSI: yes  All above HIPAA information was verified with patient.     Completed refill call assessment today to schedule patient's medication shipment from the [facility] [phone]).       Specialty medication(s) and dose(s) confirmed: Yes: humira- 2 pens weekly   Changes to medications: No  Changes to insurance: No  Questions for the pharmacist: No    The patient will receive an FSI print out for each medication shipped and additional FDA Medication Guides as required.  Patient education from [place] or Robet Leu may also be included in the shipment.    DISEASE-SPECIFIC INFORMATION        patient has 2 weeks at home of humira, will use first dose from this shipment on 11/30/17    ADHERENCE     Medication Adherence    Patient Reported X Missed Doses in the Last Month:  1  Specialty Medication:  HUMIRA  Patient is on additional specialty medications:  No  Patient is on more than two specialty medications:  No  Gaps in Refill History Greater than 2 Weeks in the Last 3 Months:  No  Informant:  patient  Confirmed Plan for Next Specialty Medication Refill:  delivery by pharmacy  Refills Needed for Supportive Medications:  not needed  Medication Assistance Program  Refill Coordination  Has the Patients' Contact Information Changed:  No    Is the Shipping Address Different:  No    Shipping Information  Delivery Scheduled:  Yes  Delivery Date:  11/18/17  Medications to be Shipped:  HUMIRA          SHIPPING     Delivery Scheduled: yes, Expected medication delivery date: 11/18/17 Renette Butters  [facility]

## 2017-11-16 MED FILL — HUMIRA PF PEN (BOX)/40MG/0.8mL/PEN: HUMIRA PF PEN (BOX)/40MG/0.8mL/PEN | 28 days supply | Qty: 4 | Fill #4

## 2017-12-15 NOTE — Unmapped (Signed)
Patient tolerating Humira @ 80 mg/daily. Seeing Dr. Janyth Contes in July, and will plan to discuss Remicade infusion at that time since she's not getting great benefit. No issues identified.    [facility] Refill and Clinical Coordination Note  Medication(s): Humira    Krista Williamson, DOB: [birth year]  Phone: [phone] (home) , Alternate phone contact: N/A  Shipping address: [address]  [place] [address]  Phone or address changes today?: No  All above HIPAA information verified.  Insurance changes? No    Completed refill and clinical call assessment today to schedule patient's medication shipment from the [facility] [phone]).      MEDICATION RECONCILIATION    Confirmed the medication and dosage are correct and have not changed: Yes, regimen is correct and unchanged.    Were there any changes to your medication(s) in the past month:  No, there are no changes reported at this time.    ADHERENCE    Is this medicine transplant or covered by Medicare Part B? No.    Did you miss any doses in the past 4 weeks? No missed doses reported.  Adherence counseling provided? Not needed     SIDE EFFECT MANAGEMENT    Are you tolerating your medication?:  Nioka reports tolerating the medication.  Side effect management discussed: None      Therapy is appropriate and should be continued.    Evidence of clinical benefit: See Epic note from 11/01/17      FINANCIAL/SHIPPING    Delivery Scheduled: Yes, Expected medication delivery date: Tues, May 21   Additional medications refilled: No additional medications/refills needed at this time.    The patient will receive an FSI print out for each medication shipped and additional FDA Medication Guides as required.  Patient education from [place] or Robet Leu may also be included in the shipment.    [place] did not have any additional questions at this time.    Delivery address validated in FSI scheduling system: Yes, address listed above is correct.      We will follow up with patient monthly for standard refill processing and delivery.      Thank you,  Tawanna Solo Shared [facility] Specialty Pharmacist

## 2017-12-20 MED FILL — HUMIRA PF PEN (BOX)/40MG/0.8mL/PEN: HUMIRA PF PEN (BOX)/40MG/0.8mL/PEN | 28 days supply | Qty: 4 | Fill #5

## 2018-01-07 NOTE — Unmapped (Signed)
Patient forgot her dose on her scheduled day this week but will be taking it today  She left her pen out on the counter and wasn't sure if she can use it- I told her it was good for 14 days at room temp as long as it stayed at room temp and didn't go back into the fridge  Told her to check the window to make sure it wasn't cloudy before injecting and if it was to call us back and let us know    [facility] Refill Coordination Note    Specialty Medication(s) to be Shipped:   Inflammatory Disorders: Humira    Other medication(s) to be shipped: n/a     Krista Williamson, DOB: 15-Aug-1970  Phone: [phone] (home)   Shipping Address: [address]  [place] [address]    All above HIPAA information was verified with patient.     Completed refill call assessment today to schedule patient's medication shipment from the [facility] [phone]).       Specialty medication(s) and dose(s) confirmed: Regimen is correct and unchanged.   Changes to medications: Krista Williamson reports no changes reported at this time.  Changes to insurance: No  Questions for the pharmacist: No    The patient will receive an FSI print out for each medication shipped and additional FDA Medication Guides as required.  Patient education from [place] or Robet Leu may also be included in the shipment.    DISEASE-SPECIFIC INFORMATION        For Inflammatory disorders patients on injectable medications: Patient currently has 1 doses left.  Next injection is scheduled for today.    ADHERENCE     Medication Adherence    Patient reported X missed doses in the last month:  0  Specialty Medication:  humira  Patient is on additional specialty medications:  No  Patient is on more than two specialty medications:  No  Any gaps in refill history greater than 2 weeks in the last 3 months:  no  Demonstrates understanding of importance of adherence:  no  Informant:  patient  Reliability of informant:  reliable  Confirmed plan for next specialty medication refill:  delivery by pharmacy  Refills needed for supportive medications:  not needed          Refill Coordination    Has the Patients' Contact Information Changed:  No  Is the Shipping Address Different:  No         SHIPPING     Shipping address confirmed in FSI.     Delivery Scheduled: Yes, Expected medication delivery date: 01/12/18 via UPS or courier.     Renette Butters   [facility] Shared [facility] Specialty Technician

## 2018-01-10 MED FILL — HUMIRA PF PEN (BOX)/40MG/0.8mL/PEN: HUMIRA PF PEN (BOX)/40MG/0.8mL/PEN | 28 days supply | Qty: 4 | Fill #6

## 2018-02-14 ENCOUNTER — Encounter: Admit: 2018-02-14 | Discharge: 2018-02-15 | Payer: MEDICARE

## 2018-02-14 DIAGNOSIS — Z79899 Other long term (current) drug therapy: Secondary | ICD-10-CM

## 2018-02-14 DIAGNOSIS — L732 Hidradenitis suppurativa: Principal | ICD-10-CM

## 2018-02-14 DIAGNOSIS — L910 Hypertrophic scar: Secondary | ICD-10-CM

## 2018-02-14 LAB — HEMOGLOBIN A1C
HEMOGLOBIN A1C: 5 % (ref 4.8–5.6)
Hemoglobin A1c/Hemoglobin.total:MFr:Pt:Bld:Qn:: 5

## 2018-02-14 MED ORDER — DIAZEPAM 5 MG TABLET
ORAL_TABLET | 0 refills | 0 days | Status: CP
Start: 2018-02-14 — End: ?

## 2018-02-14 NOTE — Unmapped (Signed)
ASSESSMENT/PLAN:  Hidradenitis Suppurativa   1. We discussed the typical natural history, pathogenesis, treatment options, and expected course as well as the relapsing and sometimes recalcitrant nature of the disease.    2. Continue adalimumab 80mg  weekly. Last quant gold 07/2017  3. Continue clindamycin 300mg  po bid and rifampin 300mg  po bid.  Discussed risk of GI upset, orange coloration of body fluids, hepatotoxicity, and myelosuppression.     4. Will set up for focused excision of small sinuses in bilateral inguinal folds. Valium 10mg  30 minutes prior.  These 2 recurrent areas are responsible for most of her symptoms at this point.  Screening - A1c today.    RTC: 1-3 months     SUBJECTIVE:    CC: Hidradenitis Suppurativa    Krista Williamson is a 47 y.o. female  who is seen for follow-up for evaluation of hidradenitis suppurativa.    Self-reported severity (0-5): 2  VAS pain today: 4  VAS average pain for the last month: 6  Requiring pain medication? No.  If so, what type/frequency? n/a  How often in pain?  few times a month  Level of odor (0-5): 1  Level of itching (0-5): 4  Dressing changes needed for drainage:No drainage/less than once weekly  How much drainage: no drainage  Flare in the last month (Y/N)? No.  How long ago was the last flare? in last 6 months  Developing new lesions? less than monthly  Number of inflammatory lesions montly: 1-3  DLQI: 17  Current treatment: adalimumab 80mg  weekly,    clinda/rifampin  How helpful is the current treatment in managing the following aspects of your disease?  Not at all helpful Somewhat helpful Very helpful   Pain  x    Decreasing length of flares  x    Decreasing new lesions  x    Drainage  x    Decreasing frequency of flares  x    Decreasing severity of flares  x    Odor  x      Disease course:  Year when symptoms first noticed: 4  Year of diagnosis: 1999  Who diagnosed you? Dermatologist  Location of first symptoms: groin and buttocks  Typical involved areas include: groin and buttocks  Typical number of inflammatory lesions each month at baseline (from first visit): 1-3  Disease triggers: none noted    Are menstrual cycles irregular when not on birth control? No.  Current form of contraception: none  Effect of hormonal contraception on disease: no change  Flaring with menstrual cycle (before, during, or after?): no relationship  Difficulty becoming pregnancy? No.  Pregnancy complications? other none  How many children? 3  Better or worse with pregnancy?  no change.  If so, worse during a specific trimester? n/a      FH:      Patient Mother Father Son Daughter Brother Sister Maternal Grandmother Maternal Grandfather Paternal Grandmother Paternal Grandfather Maternal Aunt Maternal Uncle Paternal Aunt Paternal Uncle Other:   Derm Hidradenitis Suppurativa   x x    x(3)    x  x                        Pilonidal sinus                                     Acne  Dissecting cellulitis(Scalp)                                     Eczema                                     Allergies  x                  Rheum Joint pains                                      SAPHO                                     Pyoderma gangrenosum                                     Back pain  x                                  Auto-immune disease                                     Asthma  x                  Endo Polycystic ovarian syndrome                                     Thyroid disease                    Vitamin D deficiency                   Psych Anxiety  x                                   Depression  x                                   Dementia                                     Suicidal thoughts*                                   Cardio Hypertension  x                                   High cholesterol  Heart attack                                    Stroke                                     Hem-onc Cancer  ______________ Anemia                    GI IBD (UC/Crohn's)  x   x                              ID HIV                     Syphilis                     Other                         Social History:  Current or former smoker? current  Amount smoking: half-1 ppd  How many years: 42  ED visits in the last 5 years? >10  Difficulty affording medications? sometimes  Marital Status: married  Living with some one? Yes.    Prior treatments:  Topical: none  Systemic: adalimumab 80mg  weekly (increased spring 2019), clinda/rifampin, Augmentin, Keflex, doxycycline, Bactrim  Past surgical procedures: I&D groin  Past laser procedures: none          ROS: the balance of 10 systems is negative unless otherwise documented      OBJECTIVE:   Gen: Well-appearing patient, appropriate, interactive, in no acute distress  Skin: Examination of the scalp, face, neck, chest, back, abdomen, bilateral upper and lower extremities, hands, palms, soles, nails, buttocks, and external genitalia performed today and pertinent for:     location Abscess Inflamed nodule Non-inflamed nodule Draining sinus Non-draining Sinus Hurley % scar   R axilla          L axilla          R inframammary          L inframammary          Intermammary          Pubic          R inguinal     1     R thigh          L inguinal  1   1     L thigh          Scrotum/Vulva          Perianal          R buttock          L buttock          Other (list)                      AN count (total sum of abscess and inflammatory nodule): 1  Pilonidal sinus (Y/N, or previously treated)? No.  Approximate BSA involved by inflammatory lesions: <1  Intertriginous comedones: few  Diffuse comedones (trunk, face, etc): none  Acne scars: none  Cribriform scarring: No.  Intertrigionus epidermal inclusion cysts: 1-3  Diffuse (trunk, feace, extremities) epidermal inclusion cysts: none  Regular phenotype  -sites  not commented on demonstrate normal findings.

## 2018-02-14 NOTE — Unmapped (Signed)

## 2018-02-21 MED FILL — HUMIRA PF PEN (BOX)/40MG/0.8mL/PEN: HUMIRA PF PEN (BOX)/40MG/0.8mL/PEN | 28 days supply | Qty: 4 | Fill #7

## 2018-02-21 NOTE — Unmapped (Signed)
Patient missed a dose while being out of town-   Attempted to contact patient before vacation without success.  Patient is now back. Sending out delivery Wednesday so she can continue her scheduling of dosing weekly on thursdays  She was in the car with her grandson-I did hear she was due for her dose this Thursday but could not clarify if she had one on hand for this Thursday or if she was out.  I did hear she missed a dose     [facility] Refill Coordination Note    Specialty Medication(s) to be Shipped:   Inflammatory Disorders: Humira    Other medication(s) to be shipped: n/a     Lindell Noe, DOB: 1971-02-03  Phone: [phone] (home)   Shipping Address: [address]  [place] [address]    All above HIPAA information was verified with patient.     Completed refill call assessment today to schedule patient's medication shipment from the [facility] [phone]).       Specialty medication(s) and dose(s) confirmed: Regimen is correct and unchanged.   Changes to medications: Karrah reports no changes reported at this time.  Changes to insurance: No  Questions for the pharmacist: No    The patient will receive an FSI print out for each medication shipped and additional FDA Medication Guides as required.  Patient education from [place] or Robet Leu may also be included in the shipment.    DISEASE/MEDICATION-SPECIFIC INFORMATION        For Inflammatory disorders patients on injectable medications: Patient currently has 1 doses left.  Next injection is scheduled for thursday (new box on 8/).    ADHERENCE     Medication Adherence    Patient reported X missed doses in the last month:  1  Specialty Medication:  Humira  Patient is on additional specialty medications:  No  Patient is on more than two specialty medications:  No  Any gaps in refill history greater than 2 weeks in the last 3 months:  no  Demonstrates understanding of importance of adherence:  yes  Informant: patient  Reliability of informant:  reliable  Confirmed plan for next specialty medication refill:  delivery by pharmacy  Refills needed for supportive medications:  not needed          Refill Coordination    Has the Patients' Contact Information Changed:  No  Is the Shipping Address Different:  No           SHIPPING     Shipping address confirmed in FSI.     Delivery Scheduled: Yes, Expected medication delivery date: 7/24 via UPS or courier.     Renette Butters   [facility] Specialty Technician

## 2018-03-25 NOTE — Unmapped (Signed)
[facility] Refill Coordination Note  Medication: humira    Unable to reach patient to schedule shipment for medication being filled at [facility]. Left voicemail on phone.  As this is the 3rd unsuccessful attempt to reach the patient, no additional phone call attempts will be made at this time.      Phone numbers attempted: [phone]   Last scheduled delivery: 02/22/18    Please call the [facility] at [phone] (option 4) should you have any further questions.      Thanks,  [facility] Specialty Team

## 2018-04-06 ENCOUNTER — Encounter: Admit: 2018-04-06 | Discharge: 2018-04-06 | Payer: MEDICARE

## 2018-04-06 DIAGNOSIS — G8918 Other acute postprocedural pain: Secondary | ICD-10-CM

## 2018-04-06 DIAGNOSIS — L732 Hidradenitis suppurativa: Principal | ICD-10-CM

## 2018-04-06 MED ORDER — TRAMADOL 50 MG TABLET
ORAL_TABLET | Freq: Four times a day (QID) | ORAL | 0 refills | 0.00000 days | Status: CP | PRN
Start: 2018-04-06 — End: ?

## 2018-04-06 NOTE — Unmapped (Addendum)
Managing your wound after skin surgery    Please avoid strenuous activity for 48 hours and all heavy exercise for 1 week  If your wound is on your arm, leg, or shoulder, then avoid heavy lifting, straining, or exercise with that limb for at least one week.  If your wound is on the head or neck, avoid bending over if at all possible.  Sleep with your head elevated for 48 hours to reduce the risk of bleeding.  Please don???t smoke for 3 weeks; smoking prevents healthy wound healing  Pain management: Use  600 mg of acetaminophen (Tylenol ) every 4 hours, or medication we prescribed as needed. Pain should decrease steadily for the 1st few days after your surgery.   Possible complications:  Bleeding: a small amount of blood on the bandage is normal.   If there is a significant bleeding into the bandage or beneath the stitches (this will look like swelling and purple discoloration), apply firm pressure  with a cloth or bandage for 20 minutes without interruption. Repeat if bleeding continues. If it persists, please call [phone]. During nonoffice hours use the message of this number to contact the on-call dermatologist or go to the emergency room.  Infection: usually indicated by pain that increases 4 to 6 days after surgery. Mild redness, swelling, soreness are normal, but, with increasing redness, pain, drainage, and swelling, you should contact our office.  Decreased sensation, increased sensitivity, and itching may last for 18 months.  Scarring: scars mature for one year after surgery. Reducing motion and tension over the wound for the 1st month, as well as using sun protection, reduces scarring.  Bruising is normal around the wound and resolves over 2 to 3 weeks.    Please keep the original bandage in place for at least 48 hours.  If the  bandage becomes soiled, wet, or detached, you can reinforce it with tape or add petrolatum  (Vaseline) to the wound, then place a fresh, clean, nonstick bandage (telfa) with tape or use a non stick Band-Aid.    Clean daily with water with warm water and gentle soap using a soft cloth. You can continue to use a small bandage and sunscreen for one to 2 months or until fully healed.

## 2018-04-06 NOTE — Unmapped (Signed)
ASSESSMENT/PLAN:    Hidradenitis Suppurativa  - Continue adalimumab 80mg  weekly. Last quant gold 07/2017  - Continue clindamycin 300mg  po bid and rifampin 300mg  po bid. Tolerating well.   - Unroofing procedure for hidradenitis to the bilateral inguinal folds was performed today as per procedure note below   - I&D with punch tool for inflammatory nodule on the L buttock was performed as per procedure note below.    Unroofing procedure with excision for hidradedinitis in bilateral inguinal folds:  Risk, benefits, and alternatives were discussed.  Following alcohol prep, anesthesia with 0.5% lidocaine with epinephrine was first performed with a total of 24 ml, and then fistula probe and/or forceps was used to delineate the involved area.  Iris scissor was used to open all detectable sinuses. Beveling was performed with scissor or blade as appropriate to assure second intention healing.  Hemostasis was obtained in the usual fashion with pressure, AlCl solution.  Area was dressed with petrolatum and bandage.  Wound care instructions given.  We will contact the patient with results when available.   Size of wound: L inguinal fold 40 mm, R inguinal fold 15 mm.    I&D with punch tool on the L buttock   - After verbal informed consent was obtained, the site was cleansed with alcohol and anesthetized with 1% lidocaine with epinephrine. I&D was performed with an 4 mm punch tool, down to subcutaneous fat, and hemostasis was achieved with pressure. Petrolatum and a bandage were applied to the wound. The patient tolerated the procedure well and post-care instructions were given.    Post-op pain  - traMADol (ULTRAM) 50 mg tablet; Take 1 tablet (50 mg total) by mouth every six (6) hours as needed for pain.  Dispense: 10 tablet; Refill: 0    Return in about 2 weeks (around 04/20/2018) for Recheck, HS clinic. post op follow up     SUBJECTIVE:    CC: Hidradenitis Suppurativa    Krista PRESTRIDGE is a 47 y.o. female who presents today for follow-up of hidradenitis suppurativa. She is on Humira 80 mg weekly, clindamycin 300mg  po bid and rifampin 300mg  po bid. She is here for unroofing of inguinal folds for recurrent inflammatory symptoms at these sites. Reports she developed new inflammatory lesions on the buttocks since last visit. She has been tolerating her medications well since LV.     Disease course:  Year when symptoms first noticed: 68  Year of diagnosis: 1999  Location of first symptoms: groin and buttocks  Typical involved areas include: groin and buttocks  Typical number of inflammatory lesions each month at baseline (from first visit): 1-3  Disease triggers: none noted    Prior treatments:  Topical: none  Systemic: adalimumab 80mg  weekly (increased spring 2019), clinda/rifampin, Augmentin, Keflex, doxycycline, Bactrim  Past surgical procedures: I&D groin  Past laser procedures: none    ROS: A balance of 10 systems was reviewed and negative.     OBJECTIVE:   Gen: Well-appearing patient, appropriate, interactive, in no acute distress  Skin: Examination of the scalp, face, neck, chest, back, abdomen, bilateral upper and lower extremities, hands, palms, soles, nails, buttocks, and external genitalia performed today and pertinent for:     location Abscess Inflamed nodule Non-inflamed nodule Draining sinus Non-draining Sinus Hurley % scar   R axilla          L axilla          R inframammary          L inframammary  Intermammary          Pubic          R inguinal     1     R thigh          L inguinal     2     L thigh          Scrotum/Vulva          Perianal          R buttock          L buttock  2        Other (list)                      AN count (total sum of abscess and inflammatory nodule): 2  Pilonidal sinus (Y/N, or previously treated)? No.  Approximate BSA involved by inflammatory lesions: <1  Intertriginous comedones: few  Diffuse comedones (trunk, face, etc): none  Acne scars: none  Cribriform scarring: No.  Intertrigionus epidermal inclusion cysts: 1-3  Diffuse (trunk, feace, extremities) epidermal inclusion cysts: none  Regular phenotype  -sites not commented on demonstrate normal findings.    The patient was seen and examined by Leonette Nutting, MD who agrees with the assessment and plan as above.

## 2018-04-07 NOTE — Unmapped (Signed)
I saw and evaluated the patient, participating in the key elements of the service.  I discussed the findings, assessment and plan with the resident and agree with resident’s findings and plan as documented in the resident's note.  I was immediately available for the entirety of the procedure(s) and present for the key and critical portions. Najae Filsaime J Afomia Blackley, MD

## 2018-04-08 NOTE — Unmapped (Signed)
Reviewed pathology report below:    Final Diagnosis         04/06/2018                                                                   Inguinal fold, excision   -Cystic sinus tract with keratinizing squamous epithelium, dermal fibrosis    and inflammation consistent with hidradenitis suppurativa.     Communicated results to patient via MyChart.       Dx:_______HS____________       Tracking: N

## 2018-04-18 ENCOUNTER — Encounter: Admit: 2018-04-18 | Discharge: 2018-04-19 | Payer: MEDICARE

## 2018-04-18 DIAGNOSIS — L732 Hidradenitis suppurativa: Principal | ICD-10-CM

## 2018-04-18 MED ORDER — CLINDAMYCIN HCL 300 MG CAPSULE
ORAL_CAPSULE | 3 refills | 0 days | Status: CP
Start: 2018-04-18 — End: ?

## 2018-04-18 MED ORDER — RIFAMPIN 300 MG CAPSULE
ORAL_CAPSULE | Freq: Two times a day (BID) | ORAL | 2 refills | 0.00000 days | Status: CP
Start: 2018-04-18 — End: ?

## 2018-04-18 NOTE — Unmapped (Signed)
Patient doing great s/p bilateral excisions for hidradenitis suppurativa in the R and L inguinal folds with closure.  No pain today.  Refilled clin/rif. Will follow-up with Dr. Orma Flaming for ongoing care but knows to contact me if there is concern that additional procedures might be needed.

## 2018-04-18 NOTE — Unmapped (Addendum)

## 2018-05-09 MED ORDER — HUMIRA PEN CITRATE FREE 40 MG/0.4 ML
SUBCUTANEOUS | 6 refills | 0.00000 days | Status: CP
Start: 2018-05-09 — End: 2018-11-04
  Filled 2018-05-12: qty 8, 28d supply, fill #0

## 2018-05-09 NOTE — Unmapped (Signed)
[facility] last filled medication 7/22 for 1 month supply. Patient denies missed doses. She did report increased bruising/bleeding with injection, and we discussed she could try to pinch area of skin up before applying pen (perhaps the bleeding is from needle piercing top layer of muscle?) or trying in areas with more fat under the skin (ie upper thigh instead of lower).     [facility] Refill and Clinical Coordination Note  Medication(s): Humira    Krista Williamson, DOB: 03-Feb-1971  Phone: [phone] (home) , Alternate phone contact: N/A  Shipping address: [address]  [place] [address]  Phone or address changes today?: No  All above HIPAA information verified.  Insurance changes? No    Completed refill and clinical call assessment today to schedule patient's medication shipment from the [facility] [phone]).      MEDICATION RECONCILIATION    Confirmed the medication and dosage are correct and have not changed: Yes, regimen is correct and unchanged.    Were there any changes to your medication(s) in the past month:  No, there are no changes reported at this time.    ADHERENCE    Is this medicine transplant or covered by Medicare Part B? No.    Did you miss any doses in the past 4 weeks? Unclear - patient denies missed, last filled >2 months ago    Adherence counseling provided? Not needed     SIDE EFFECT MANAGEMENT    Are you tolerating your medication?:  Krista Williamson reports tolerating the medication.  Side effect management discussed: None      Therapy is appropriate and should be continued.    Evidence of clinical benefit: See Epic note from 04/06/18      FINANCIAL/SHIPPING    Delivery Scheduled: Yes, Expected medication delivery date: Friday, Oct 11      Additional medications refilled: No additional medications/refills needed at this time.    The patient will receive a drug information handout for each medication shipped and additional FDA Medication Guides as required.      [name] did not have any additional questions at this time.    Delivery address confirmed in Epic.     We will follow up with patient monthly for standard refill processing and delivery.      Thank you,  Tawanna Solo [facility] Specialty Pharmacist

## 2018-05-12 MED FILL — HUMIRA PEN CITRATE FREE 40 MG/0.4 ML: 28 days supply | Qty: 8 | Fill #0 | Status: AC

## 2018-06-02 ENCOUNTER — Other Ambulatory Visit: Payer: Self-pay | Admitting: Family Medicine

## 2018-06-02 ENCOUNTER — Ambulatory Visit
Admission: RE | Admit: 2018-06-02 | Discharge: 2018-06-02 | Disposition: A | Discharge status: Home / Self Care | Payer: Medicare Other | Source: Ambulatory Visit | Attending: Family Medicine | Admitting: Family Medicine

## 2018-06-02 DIAGNOSIS — J329 Chronic sinusitis, unspecified: Secondary | ICD-10-CM

## 2018-08-13 ENCOUNTER — Other Ambulatory Visit: Payer: Self-pay

## 2018-08-13 ENCOUNTER — Encounter (HOSPITAL_COMMUNITY): Payer: Self-pay | Admitting: Emergency Medicine

## 2018-08-13 ENCOUNTER — Emergency Department (HOSPITAL_COMMUNITY)
Admission: EM | Admit: 2018-08-13 | Discharge: 2018-08-13 | Disposition: A | Discharge status: Home / Self Care | Payer: Medicare Other | Attending: Emergency Medicine | Admitting: Emergency Medicine

## 2018-08-13 DIAGNOSIS — X509XXA Other and unspecified overexertion or strenuous movements or postures, initial encounter: Secondary | ICD-10-CM | POA: Insufficient documentation

## 2018-08-13 DIAGNOSIS — F17210 Nicotine dependence, cigarettes, uncomplicated: Secondary | ICD-10-CM | POA: Insufficient documentation

## 2018-08-13 DIAGNOSIS — M545 Low back pain: Secondary | ICD-10-CM | POA: Diagnosis not present

## 2018-08-13 DIAGNOSIS — Y999 Unspecified external cause status: Secondary | ICD-10-CM | POA: Diagnosis not present

## 2018-08-13 DIAGNOSIS — Z79899 Other long term (current) drug therapy: Secondary | ICD-10-CM | POA: Diagnosis not present

## 2018-08-13 DIAGNOSIS — J45909 Unspecified asthma, uncomplicated: Secondary | ICD-10-CM | POA: Insufficient documentation

## 2018-08-13 DIAGNOSIS — Y9389 Activity, other specified: Secondary | ICD-10-CM | POA: Diagnosis not present

## 2018-08-13 DIAGNOSIS — Y92000 Kitchen of unspecified non-institutional (private) residence as  the place of occurrence of the external cause: Secondary | ICD-10-CM | POA: Diagnosis not present

## 2018-08-13 DIAGNOSIS — S3992XA Unspecified injury of lower back, initial encounter: Secondary | ICD-10-CM | POA: Diagnosis present

## 2018-08-13 DIAGNOSIS — G8929 Other chronic pain: Secondary | ICD-10-CM

## 2018-08-13 MED ORDER — CYCLOBENZAPRINE HCL 10 MG PO TABS: 10.0000 mg | ORAL_TABLET | Freq: Two times a day (BID) | ORAL | 0 refills | Status: DC | PRN

## 2018-08-13 MED ORDER — CYCLOBENZAPRINE HCL 10 MG PO TABS
10.0000 mg | ORAL_TABLET | Freq: Once | ORAL | Status: AC
  Administered 2018-08-13: 10 mg via ORAL
  Filled 2018-08-13: qty 1

## 2018-08-13 MED ORDER — NAPROXEN 500 MG PO TABS: 500.0000 mg | ORAL_TABLET | Freq: Two times a day (BID) | ORAL | 0 refills | Status: DC

## 2018-08-13 MED ORDER — OXYCODONE-ACETAMINOPHEN 5-325 MG PO TABS
1.0000 | ORAL_TABLET | Freq: Once | ORAL | Status: AC
  Administered 2018-08-13: 1 via ORAL
  Filled 2018-08-13: qty 1

## 2018-08-13 NOTE — ED Triage Notes (Signed)
Pt c/o lower back pains for 3 days. Reports that she was standing in kitchen washing dishes when leg went out from underneath patient. Pt reports had multiple falls in the past 3 months and uses a walker.

## 2018-08-13 NOTE — ED Provider Notes (Signed)
[facility]-EMERGENCY DEPT Provider Note   CSN: [id number] Arrival date & time: 08/13/18  1132     History   Chief Complaint Chief Complaint  Patient presents with  . Back Pain    HPI Bailey Mcdaniel is a 48 y.o. female with hx of low back pain who presents to the ED for low back pain that started 3 days ago. Patient states she was standing in her kitchen washing dishes and her leg went out from under her. Patient states she did NOT fall.  Patient reports she has had a fall in the past 3 months and is supposed to use a walker. Patient last saw ortho 6 months ago and was told to use a walker at that time. Patient was told she would need to go through PT for her back and she is not sure she wants to do that. Patient states she is going to another orthopedic doctor and waiting for appointment. Patient reports back surgery 2013 L-1 S1 due to ruptured disc.   The history is provided by the patient. No language interpreter was used.  Back Pain  Location:  Lumbar spine Quality:  Stabbing Radiates to:  Does not radiate Pain severity:  Severe Onset quality:  Gradual Duration:  3 days Timing:  Constant Progression:  Worsening Chronicity:  Recurrent Relieved by:  Nothing Worsened by:  Deep breathing, movement, twisting, sneezing and ambulation Ineffective treatments:  NSAIDs Associated symptoms: no abdominal pain, no bladder incontinence, no bowel incontinence, no chest pain, no dysuria, no fever, no headaches and no leg pain     Past Medical History:  Diagnosis Date  . Anxiety and depression   . Asthma   . Back pain    s/p surgery 8-12  . Lupus (HCC)   . Migraine     Patient Active Problem List   Diagnosis Date Noted  . Hydradenitis 07/07/2016  . Back pain 08/26/2011  . Anxiety and depression 08/26/2011    Past Surgical History:  Procedure Laterality Date  . ABDOMINAL HYSTERECTOMY    . BACK SURGERY  03/28/2011  . BACK SURGERY    . BLADDER SURGERY      at time of hysterectomy  . HAND SURGERY  2009   R hand, due to a Fx   . HAND SURGERY    . hsyterectomy and unilateral oophorectomy per patient       OB History    Gravida  3   Para  3   Term  0   Preterm  0   AB  0   Living        SAB  0   TAB  0   Ectopic  0   Multiple      Live Births               Home Medications    Prior to Admission medications   Medication Sig Start Date End Date Taking? Authorizing Provider  Adalimumab (HUMIRA) 40 MG/0.8ML PSKT Inject 80 mg into the skin once a week.    Yes [provider]  atorvastatin (LIPITOR) 10 MG tablet Take 10 mg by mouth daily.   Yes [provider]  buPROPion (WELLBUTRIN SR) 150 MG 12 hr tablet Take 150 mg by mouth 2 (two) times daily.   Yes [provider]  clonazePAM (KLONOPIN) 0.5 MG tablet Take 0.5 mg by mouth 2 (two) times daily as needed for anxiety.   Yes [provider]  lubiprostone Valinda Hoar)  8 MCG capsule Take 8 mcg by mouth 2 (two) times daily with a meal.   Yes [provider]  rifampin (RIFADIN) 300 MG capsule Take 300 mg by mouth 2 (two) times daily.  04/18/18  Yes [provider]  cyclobenzaprine (FLEXERIL) 10 MG tablet Take 1 tablet (10 mg total) by mouth 2 (two) times daily as needed for muscle spasms. 08/13/18   Janne Napoleon, NP  naproxen (NAPROSYN) 500 MG tablet Take 1 tablet (500 mg total) by mouth 2 (two) times daily. 08/13/18   Janne Napoleon, NP    Family History Family History  Problem Relation Age of Onset  . Sudden death Father   . Heart attack Father        F MI at age 67 and 66  . Cancer Mother        type?  Marland Kitchen Hypertension Mother   . Stroke Mother   . Diabetes Mother   . CAD Mother   . Colon cancer Neg Hx   . Breast cancer Neg Hx     Social History Social History   Tobacco Use  . Smoking status: Current Every Day Smoker    Packs/day: 0.25    Types: Cigarettes  . Smokeless tobacco: Never Used  Substance Use  Topics  . Alcohol use: Yes  . Drug use: No     Allergies   Patient has no known allergies.   Review of Systems Review of Systems  Constitutional: Negative for fever.  HENT: Negative.   Respiratory: Negative for cough and shortness of breath.   Cardiovascular: Negative for chest pain.  Gastrointestinal: Negative for abdominal pain, bowel incontinence, nausea and vomiting.  Genitourinary: Negative for bladder incontinence, dysuria and frequency.  Musculoskeletal: Positive for back pain.  Skin: Negative for rash and wound.  Neurological: Negative for headaches.  Psychiatric/Behavioral: Negative for confusion.     Physical Exam Updated Vital Signs BP 136/84 (BP Location: Left Arm)   Pulse 87   Temp 98.3 F (36.8 C) (Oral)   Resp 18   Ht 5\' 4"  (1.626 m)   Wt 65.3 kg   SpO2 100%   BMI 24.72 kg/m   Physical Exam Vitals signs and nursing note reviewed.  Constitutional:      General: She is not in acute distress.    Appearance: She is well-developed and normal weight.  HENT:     Head: Normocephalic.     Mouth/Throat:     Mouth: Mucous membranes are moist.     Pharynx: Oropharynx is clear.  Eyes:     Extraocular Movements: Extraocular movements intact.     Conjunctiva/sclera: Conjunctivae normal.  Neck:     Musculoskeletal: Neck supple.  Cardiovascular:     Rate and Rhythm: Normal rate.     Pulses: Normal pulses.  Pulmonary:     Effort: Pulmonary effort is normal.  Abdominal:     Palpations: Abdomen is soft.     Tenderness: There is no abdominal tenderness.  Musculoskeletal:     Lumbar back: She exhibits tenderness and spasm. She exhibits normal pulse. Decreased range of motion: due to pain.       Back:     Comments: Pain with straight leg raises.  Skin:    General: Skin is warm and dry.  Neurological:     Mental Status: She is alert and oriented to person, place, and time.     Cranial Nerves: No cranial nerve deficit.     Sensory: Sensation is intact.  Gait: Gait normal.     Deep Tendon Reflexes: Reflexes are normal and symmetric.  Psychiatric:        Mood and Affect: Mood normal.      ED Treatments / Results  Labs (all labs ordered are listed, but only abnormal results are displayed) Labs Reviewed - No data to display  Radiology No results found.  Procedures Procedures (including critical care time)  Medications Ordered in ED Medications  oxyCODONE-acetaminophen (PERCOCET/ROXICET) 5-325 MG per tablet 1 tablet (1 tablet Oral Given 08/13/18 1250)  cyclobenzaprine (FLEXERIL) tablet 10 mg (10 mg Oral Given 08/13/18 1251)     Initial Impression / Assessment and Plan / ED Course  I have reviewed the triage vital signs and the nursing notes. Patient with back pain.  No neurological deficits and normal neuro exam.  Patient can walk but states is painful.  No loss of bowel or bladder control.  No concern for cauda equina.  No fever, night sweats, weight loss, h/o cancer, IVDU.  RICE protocol and pain medicine indicated and discussed with patient.   Final Clinical Impressions(s) / ED Diagnoses   Final diagnoses:  Acute exacerbation of chronic low back pain    ED Discharge Orders         Ordered    cyclobenzaprine (FLEXERIL) 10 MG tablet  2 times daily PRN     08/13/18 1326    naproxen (NAPROSYN) 500 MG tablet  2 times daily     08/13/18 1326           Damian Leavelleese, [place] Spring[place] M, [place]NP 08/13/18 1329    Cathren LaineSteinl, Kevin, MD 08/13/18 1524

## 2018-08-13 NOTE — ED Notes (Signed)
Bed: WTR6 Expected date:  Expected time:  Means of arrival:  Comments: 

## 2018-08-13 NOTE — Discharge Instructions (Signed)
Follow up with your primary care doctor to discuss pain management, physical therapy, orthopedic referral and medication. Do not drive while taking the muscle relaxer as it will make you sleepy.

## 2018-08-18 ENCOUNTER — Other Ambulatory Visit: Payer: Self-pay | Admitting: Orthopaedic Surgery

## 2018-08-18 DIAGNOSIS — M545 Low back pain, unspecified: Secondary | ICD-10-CM

## 2018-08-24 ENCOUNTER — Ambulatory Visit
Admission: RE | Admit: 2018-08-24 | Discharge: 2018-08-24 | Disposition: A | Discharge status: Home / Self Care | Payer: Medicare Other | Source: Ambulatory Visit | Attending: Orthopaedic Surgery | Admitting: Orthopaedic Surgery

## 2018-08-24 DIAGNOSIS — M545 Low back pain, unspecified: Secondary | ICD-10-CM

## 2018-08-24 MED ORDER — GADOBENATE DIMEGLUMINE 529 MG/ML IV SOLN
13.0000 mL | Freq: Once | INTRAVENOUS | Status: AC | PRN
  Administered 2018-08-24: 13 mL via INTRAVENOUS

## 2018-09-12 ENCOUNTER — Ambulatory Visit (INDEPENDENT_AMBULATORY_CARE_PROVIDER_SITE_OTHER): Payer: Medicare Other | Admitting: Gynecology

## 2018-09-12 ENCOUNTER — Encounter: Payer: Self-pay | Admitting: Gynecology

## 2018-09-12 VITALS — BP 118/76 | Ht 64.0 in | Wt 144.0 lb

## 2018-09-12 DIAGNOSIS — Z7251 High risk heterosexual behavior: Secondary | ICD-10-CM

## 2018-09-12 DIAGNOSIS — N644 Mastodynia: Secondary | ICD-10-CM | POA: Diagnosis not present

## 2018-09-12 DIAGNOSIS — Z01419 Encounter for gynecological examination (general) (routine) without abnormal findings: Secondary | ICD-10-CM

## 2018-09-12 NOTE — Progress Notes (Signed)
    Lindell NoeMelinda J Dougan 09/28/1970 [id number]        48 y.o.  G3P0003 for breast and pelvic exam.  Notes over the past year the onset of bilateral breast pain right greater than left for several days each month that she feels correlates when she would be having her menses.  Completely resolves after that.  No palpable abnormalities on self breast exam.  Reports mamm[facility] with [facility] the right breast [facility] which was [facility].  Past medical history,surgical history, problem list, medications, allergies, family history and social history were all reviewed and documented as reviewed in the EPIC chart.  ROS:  Performed with pertinent positives and negatives included in the history, assessment and plan.   Additional significant findings : None   Exam: Kennon PortelaKim Gardner assistant Vitals:   09/12/18 0911  BP: 118/76  Weight: 144 lb (65.3 kg)  Height: 5\' 4"  (1.626 m)   Body mass index is 24.72 kg/m.  General appearance:  Normal affect, orientation and appearance. Skin: Grossly normal HEENT: Without gross lesions.  No cervical or supraclavicular adenopathy. Thyroid normal.  Lungs:  Clear without wheezing, rales or rhonchi Cardiac: RR, without RMG Abdominal:  Soft, nontender, without masses, guarding, rebound, organomegaly or hernia Breasts:  Examined lying and sitting without masses, retractions, discharge or axillary adenopathy. Pelvic:  Ext, BUS, Vagina: Normal.  Pap smear of cuff done  Adnexa: Without masses or tenderness    Anus and perineum: Normal   Rectovaginal: Normal sphincter tone without palpated masses or tenderness.    Assessment/Plan:  48 y.o. 323P0003 female for breast and pelvic exam.  Status post hysterectomy USO in the past  1. Breast pain right greater than left.  Notes primarily right breast pain for several days each month although notes some tenderness on the left.  Pain completely resolves after those few days.  She correlates this to when she would be  having a menses.  Exam today is normal.  Reported mammogram/ultrasound was negative last month.  Clearly sounds hormonal in nature.  Discussed with patient to continue with self breast exams monthly and as long as the pain is transient and resolves and she has no palpable abnormalities will follow.  Recommended OTC nonsteroidals for the discomfort.  If any palpable abnormalities or persistence in her pain office visit for reevaluation. 2. Pap smear reported 2017.  Pap smear of vaginal cuff done today.  No history of significant abnormal Pap smears. 3. Health maintenance.  No routine lab work done as patient has a primary physician who follows her.  Follow-up in 1 year, sooner as needed.   Dara Lordsimothy P Avice Funchess MD, 9:33 AM 09/12/2018

## 2018-09-12 NOTE — Addendum Note (Signed)
Addended by: Dayna Barker on: 09/12/2018 11:03 AM   Modules accepted: Orders

## 2018-09-12 NOTE — Patient Instructions (Signed)
Follow-up for annual exam in 1 year, sooner if any issues 

## 2018-09-13 ENCOUNTER — Encounter: Payer: Self-pay | Admitting: Gynecology

## 2018-09-13 LAB — PAP IG W/ RFLX HPV ASCU

## 2018-10-20 ENCOUNTER — Emergency Department (HOSPITAL_COMMUNITY)
Admission: EM | Admit: 2018-10-20 | Discharge: 2018-10-20 | Disposition: A | Discharge status: Home / Self Care | Payer: Medicare Other | Attending: Emergency Medicine | Admitting: Emergency Medicine

## 2018-10-20 ENCOUNTER — Encounter (HOSPITAL_COMMUNITY): Payer: Self-pay | Admitting: *Deleted

## 2018-10-20 ENCOUNTER — Other Ambulatory Visit: Payer: Self-pay

## 2018-10-20 DIAGNOSIS — N611 Abscess of the breast and nipple: Secondary | ICD-10-CM | POA: Insufficient documentation

## 2018-10-20 DIAGNOSIS — Z79899 Other long term (current) drug therapy: Secondary | ICD-10-CM | POA: Diagnosis not present

## 2018-10-20 DIAGNOSIS — N644 Mastodynia: Secondary | ICD-10-CM | POA: Diagnosis present

## 2018-10-20 DIAGNOSIS — J45909 Unspecified asthma, uncomplicated: Secondary | ICD-10-CM | POA: Insufficient documentation

## 2018-10-20 DIAGNOSIS — F17210 Nicotine dependence, cigarettes, uncomplicated: Secondary | ICD-10-CM | POA: Insufficient documentation

## 2018-10-20 MED ORDER — DOXYCYCLINE HYCLATE 100 MG PO CAPS: 100.0000 mg | ORAL_CAPSULE | Freq: Two times a day (BID) | ORAL | 0 refills | Status: DC

## 2018-10-20 MED ORDER — HYDROCODONE-ACETAMINOPHEN 5-325 MG PO TABS: 1.0000 | ORAL_TABLET | Freq: Four times a day (QID) | ORAL | 0 refills | Status: DC | PRN

## 2018-10-20 MED ORDER — DOXYCYCLINE HYCLATE 100 MG PO TABS
100.0000 mg | ORAL_TABLET | Freq: Once | ORAL | Status: AC
  Administered 2018-10-20: 100 mg via ORAL
  Filled 2018-10-20: qty 1

## 2018-10-20 MED ORDER — CEPHALEXIN 500 MG PO CAPS
500.0000 mg | ORAL_CAPSULE | Freq: Once | ORAL | Status: AC
  Administered 2018-10-20: 500 mg via ORAL
  Filled 2018-10-20: qty 1

## 2018-10-20 MED ORDER — CEPHALEXIN 500 MG PO CAPS: 500.0000 mg | ORAL_CAPSULE | Freq: Four times a day (QID) | ORAL | 0 refills | Status: AC

## 2018-10-20 NOTE — Discharge Instructions (Addendum)
Please take Ibuprofen (Advil, motrin) and Tylenol (acetaminophen) to relieve your pain.  You may take up to 600 MG (3 pills) of normal strength ibuprofen every 8 hours as needed.  In between doses of ibuprofen you make take tylenol, up to 1,000 mg (two extra strength pills).  Do not take more than 3,000 mg tylenol in a 24 hour period.  Please check all medication labels as many medications such as pain and cold medications may contain tylenol.  Do not drink alcohol while taking these medications.  Do not take other NSAID'S while taking ibuprofen (such as aleve or naproxen).  Please take ibuprofen with food to decrease stomach upset.  You may have diarrhea from the antibiotics.  It is very important that you continue to take the antibiotics even if you get diarrhea unless a medical professional tells you that you may stop taking them.  If you stop too early the bacteria you are being treated for will become stronger and you may need different, more powerful antibiotics that have more side effects and worsening diarrhea.  Please stay well hydrated and consider probiotics as they may decrease the severity of your diarrhea.  Please be aware that if you take any hormonal contraception (birth control pills, nexplanon, the ring, etc) that your birth control will not work while you are taking antibiotics and you need to use back up protection as directed on the birth control medication information insert.   You are being prescribed a medication which may make you sleepy. For 24 hours after one dose please do not drive, operate heavy machinery, care for a small child with out another adult present, or perform any activities that may cause harm to you or someone else if you were to fall asleep or be impaired.

## 2018-10-20 NOTE — ED Triage Notes (Signed)
Pt c/o right breast "lump".  Pt stated "I just had a mammogram last month.  I usually have this when my menstrual cycle is on but it has never lasted 5 days.  It hurts to touch & is warm."  Pt presents with redness to right areola.  Pt denies any breast d/c.

## 2018-10-20 NOTE — ED Provider Notes (Signed)
Bailey Mcdaniel Provider Note   CSN: [id number] Arrival date & time: 10/20/18  1920    History   Chief Complaint Chief Complaint  Patient presents with  . Breast Problem    right    HPI Bailey Mcdaniel is a 48 y.o. female who presents today for right breast pain.  She reports that for 5 days she has had right breast pain.  She usually had pain around her menstrual cycle, had a mammogram last month to evaluate for breast pain.  She took ibuprofen 400 mg 2 hours prior to arrival.  She denies any fevers.  She has a history of hidradenitis, however has never had any lesions on her breast before.  She reports that she has been trying compresses without significant relief.  She reports that she usually has nipple drainage, has not had any new or abnormal drainage.        HPI  Past Medical History:  Diagnosis Date  . Anxiety and depression   . Asthma   . Back pain    s/p surgery 8-12  . Migraine     Patient Active Problem List   Diagnosis Date Noted  . Hydradenitis 07/07/2016  . Back pain 08/26/2011  . Anxiety and depression 08/26/2011    Past Surgical History:  Procedure Laterality Date  . ABDOMINAL HYSTERECTOMY    . BACK SURGERY  03/28/2011  . BACK SURGERY    . BLADDER SURGERY     at time of hysterectomy  . HAND SURGERY  2009   R hand, due to a Fx   . HAND SURGERY    . hsyterectomy and unilateral oophorectomy per patient       OB History    Gravida  3   Para  3   Term  0   Preterm  0   AB  0   Living  3     SAB  0   TAB  0   Ectopic  0   Multiple      Live Births               Home Medications    Prior to Admission medications   Medication Sig Start Date End Date Taking? Authorizing Provider  Adalimumab (HUMIRA) 40 MG/0.8ML PSKT Inject 80 mg into the skin once a week.     [provider]  atorvastatin (LIPITOR) 10 MG tablet Take 10 mg by mouth daily.    [provider]  buPROPion  (WELLBUTRIN SR) 150 MG 12 hr tablet Take 150 mg by mouth 2 (two) times daily.    [provider]  cephALEXin (KEFLEX) 500 MG capsule Take 1 capsule (500 mg total) by mouth 4 (four) times daily for 10 days. 10/20/18 10/30/18  Cristina Gong, PA-C  clonazePAM (KLONOPIN) 0.5 MG tablet Take 0.5 mg by mouth 2 (two) times daily as needed for anxiety.    [provider]  doxycycline (VIBRAMYCIN) 100 MG capsule Take 1 capsule (100 mg total) by mouth 2 (two) times daily. 10/20/18   Cristina Gong, PA-C  HYDROcodone-acetaminophen (NORCO/VICODIN) 5-325 MG tablet Take 1 tablet by mouth every 6 (six) hours as needed for severe pain. 10/20/18   Cristina Gong, PA-C  lubiprostone (AMITIZA) 8 MCG capsule Take 8 mcg by mouth 2 (two) times daily with a meal.    [provider]  naproxen (NAPROSYN) 500 MG tablet Take 1 tablet (500 mg total) by mouth 2 (two) times daily.  08/13/18   Janne NapoleonNeese, Hope M, NP  rifampin (RIFADIN) 300 MG capsule Take 300 mg by mouth 2 (two) times daily.  04/18/18   [provider]    Family History Family History  Problem Relation Age of Onset  . Sudden death Father   . Heart attack Father        F MI at age 47 and 49M  . Cancer Mother        type?  Marland Kitchen. Hypertension Mother   . Stroke Mother   . Diabetes Mother   . CAD Mother   . Colon cancer Neg Hx   . Breast cancer Neg Hx     Social History Social History   Tobacco Use  . Smoking status: Current Every Day Smoker    Packs/day: 1.00    Types: Cigarettes  . Smokeless tobacco: Never Used  Substance Use Topics  . Alcohol use: Not Currently  . Drug use: No     Allergies   Patient has no known allergies.   Review of Systems Review of Systems  Constitutional: Negative for chills and fever.  Skin:       Right breast redness and pain  All other systems reviewed and are negative.    Physical Exam Updated Vital Signs BP 109/73 (BP Location: Left Arm)   Pulse 92   Temp 98.6 F  (37 C) (Oral)   Resp 18   Ht 5\' 4"  (1.626 m)   Wt 63.5 kg   SpO2 94%   BMI 24.03 kg/m   Physical Exam Vitals signs and nursing note reviewed. Exam conducted with a chaperone present (Female EMT/tech).  Constitutional:      General: She is not in acute distress.    Appearance: She is not ill-appearing.  HENT:     Head: Normocephalic.  Cardiovascular:     Rate and Rhythm: Normal rate.  Pulmonary:     Effort: Pulmonary effort is normal. No respiratory distress.  Chest:     Comments: Redness around the right breast and right breast is generally TTP, there is no localized fluctuance around the redness.  There is dried/crusted yellow drainage on bilateral nipples.  Left breast not palpated.   Neurological:     Mental Status: She is alert.  Psychiatric:        Attention and Perception: Attention normal.        Mood and Affect: Mood normal.      ED Treatments / Results  Labs (all labs ordered are listed, but only abnormal results are displayed) Labs Reviewed - No data to display  EKG None  Radiology No results found.  Procedures Procedures (including critical care time)  Medications Ordered in ED Medications  cephALEXin (KEFLEX) capsule 500 mg (500 mg Oral Given 10/20/18 2103)  doxycycline (VIBRA-TABS) tablet 100 mg (100 mg Oral Given 10/20/18 2102)     Initial Impression / Assessment and Plan / ED Course  I have reviewed the triage vital signs and the nursing notes.  Pertinent labs & imaging results that were available during my care of the patient were reviewed by me and considered in my medical decision making (see chart for details).       Patient presents today for evaluation of right sided breast pain and redness.  She is status post hysterectomy however still has one ovary, is not breast-feeding.  She has a history of hidradenitis.  She has had 5 days of redness around her right nipple.  On exam no localized fluctuance,  however the breast is generally tender  to palpation.  I suspect that she has a deeper breast abscess.  Orders placed for right sided ultrasound-guided breast aspiration at the breast center.  Given that I suspect abscess we will treat her with Keflex and doxycycline.  PMP database was consulted, she is given prescription for Vicodin in addition to recommendations on ibuprofen and Tylenol use.    She is afebrile, not tachycardic or tachypnea, does not meet SIRS or sepsis criteria.   Return precautions were discussed with patient who states their understanding.  At the time of discharge patient denied any unaddressed complaints or concerns.  Patient is agreeable for discharge home.   Final Clinical Impressions(s) / ED Diagnoses   Final diagnoses:  Breast abscess    ED Discharge Orders         Ordered    US BREAST ASPIRATION RIGHT     10/20/18 2045    HYDROcodone-acetaminophen (NORCO/VICODIN) 5-325 MG tablet  Every 6 hours PRN     10/20/18 2048    cephALEXin (KEFLEX) 500 MG capsule  4 times daily     10/20/18 2048    doxycycline (VIBRAMYCIN) 100 MG capsule  2 times daily     10/20/18 2048           Cristina Gong, PA-C 10/20/18 2317    Lorre Nick, MD 10/24/18 [phone]

## 2018-10-21 NOTE — Unmapped (Signed)
Pt states  She has a spreading area, and different area, in the boob area, Pt ER went last night.  Pt request to be seen urgently.

## 2018-10-24 ENCOUNTER — Other Ambulatory Visit: Payer: Self-pay | Admitting: Family Medicine

## 2018-10-24 ENCOUNTER — Other Ambulatory Visit: Payer: Medicare Other

## 2018-10-24 DIAGNOSIS — N611 Abscess of the breast and nipple: Secondary | ICD-10-CM

## 2018-10-24 NOTE — Unmapped (Signed)
[facility] Refill Telephone Call   Medication: Humira  ??  Unable to reach patient to schedule shipment of Humira filled at [facility]. Left voicemail on phone. As this is the 3rd unsuccessful attempt to reach the patient, no additional phone call will be made at this time by the [facility].      Attempted calls: 10/29, 11/4, and 11/11  ??  Last scheduled delivery of Humira was 05/12/18 for a 1 month supply. Please call the [facility] at [phone] (option 4) should you have any further questions or to schedule next refill.      [name]  [facility]

## 2018-10-24 NOTE — Unmapped (Signed)
She was sent to the breast center by her PCP, do you still want her to come in next week?

## 2018-10-25 ENCOUNTER — Ambulatory Visit
Admission: RE | Admit: 2018-10-25 | Discharge: 2018-10-25 | Disposition: A | Discharge status: Home / Self Care | Payer: Medicare Other | Source: Ambulatory Visit | Attending: Family Medicine | Admitting: Family Medicine

## 2018-10-25 ENCOUNTER — Other Ambulatory Visit: Payer: Self-pay

## 2018-10-25 DIAGNOSIS — N611 Abscess of the breast and nipple: Secondary | ICD-10-CM

## 2018-10-26 ENCOUNTER — Ambulatory Visit: Admit: 2018-10-26 | Discharge: 2018-10-27 | Payer: MEDICARE

## 2018-10-26 DIAGNOSIS — D489 Neoplasm of uncertain behavior, unspecified: Principal | ICD-10-CM

## 2018-10-26 DIAGNOSIS — K219 Gastro-esophageal reflux disease without esophagitis: Principal | ICD-10-CM

## 2018-10-26 DIAGNOSIS — G8929 Other chronic pain: Principal | ICD-10-CM

## 2018-10-26 DIAGNOSIS — Z79899 Other long term (current) drug therapy: Principal | ICD-10-CM

## 2018-10-26 DIAGNOSIS — M549 Dorsalgia, unspecified: Principal | ICD-10-CM

## 2018-10-26 DIAGNOSIS — M1990 Unspecified osteoarthritis, unspecified site: Principal | ICD-10-CM

## 2018-10-26 NOTE — Unmapped (Signed)
[facility] Clinical Assessment & Refill Coordination Note    Krista Williamson, DOB: [birth year]   Phone: [phone] (home)     All above HIPAA information was verified with patient.     Specialty Medication(s):   Inflammatory Disorders: Humira     Current Outpatient Medications   Medication Sig Dispense Refill   ??? acetaminophen (TYLENOL) 325 MG tablet Frequency:   Dosage:0.0     Instructions:  Note:     ??? HUMIRA PEN CITRATE FREE 40 MG/0.4 ML Inject the contents of 2 pens (80 mg total) under the skin once a week. 8 each 6   ??? atorvastatin (LIPITOR) 10 MG tablet Take 10 mg by mouth.     ??? cephalexin (KEFLEX) 500 MG capsule Take 500 mg by mouth Two (2) times a day.     ??? clindamycin (CLEOCIN) 300 MG capsule Take 300 MG BID with food 60 capsule 3   ??? clonazePAM (KLONOPIN) 1 MG tablet Take 1 mg by mouth daily as needed for anxiety.     ??? diazePAM (VALIUM) 5 MG tablet Take 2 tablets (10mg ) one hour prior to dermatology procedure 4 tablet 0   ??? ibuprofen (ADVIL,MOTRIN) 200 MG tablet Take 400 mg by mouth.     ??? lubiprostone (AMITIZA) 8 MCG capsule Take 8 mcg by mouth 2 (two) times a day with meals.     ??? naproxen sodium (ALEVE) 220 MG tablet Take 440-660 mg by mouth.     ??? ofloxacin (OCUFLOX) 0.3 % ophthalmic solution Apply 5 drops in left ear twice daily for 7 days     ??? rifAMPin (RIFADIN) 300 MG capsule Take 1 capsule (300 mg total) by mouth Two (2) times a day. 60 capsule 2   ??? traMADol (ULTRAM) 50 mg tablet Take 1 tablet (50 mg total) by mouth every six (6) hours as needed for pain. 10 tablet 0     No current facility-administered medications for this visit.         Changes to medications: Krista Williamson reports no changes reported at this time.    No Known Allergies    Changes to allergies: No    SPECIALTY MEDICATION ADHERENCE     Humira 40mg /0.59ml .: 0 days of medicine on hand         Specialty medication(s) dose(s) confirmed: Regimen is correct and unchanged.     Are there any concerns with adherence? No    Adherence counseling provided? Not needed    CLINICAL MANAGEMENT AND INTERVENTION      Clinical Benefit Assessment:    Do you feel the medicine is effective or helping your condition? Yes    Clinical Benefit counseling provided? Not needed    Adverse Effects Assessment:    Are you experiencing any side effects? No    Are you experiencing difficulty administering your medicine? No    Quality of Life Assessment:    How many days over the past month did your Hidradenitis Suppurativa  keep you from your normal activities? For example, brushing your teeth or getting up in the morning. 0    Have you discussed this with your provider? Not needed    Therapy Appropriateness:    Is therapy appropriate? Yes, therapy is appropriate and should be continued    DISEASE/MEDICATION-SPECIFIC INFORMATION      For patients on injectable medications: Patient currently has 1 doses left.  Next injection is scheduled for 10/27/2018.    PATIENT SPECIFIC NEEDS     ?  Does the patient have any physical, cognitive, or cultural barriers? No    ? Is the patient high risk? No     ? Does the patient require a Care Management Plan? No     ? Does the patient require physician intervention or other additional services (i.e. nutrition, smoking cessation, social work)? No      SHIPPING     Specialty Medication(s) to be Shipped:   Inflammatory Disorders: Humira 40mg /0.4 ml    Other medication(s) to be shipped: none     Changes to insurance: No    Delivery Scheduled: Yes, Expected medication delivery date: 10/28/2018.     Medication will be delivered via UPS to the confirmed home address in [facility].    The patient will receive a drug information handout for each medication shipped and additional FDA Medication Guides as required.  Verified that patient has previously received a Conservation officer, historic buildings.    Krista Williamson   [facility] Specialty Pharmacist

## 2018-10-26 NOTE — Unmapped (Signed)
-   Use vaseline daily and wear a non stick pad in the bra every day until heals

## 2018-10-26 NOTE — Unmapped (Signed)
ASSESSMENT/PLAN:    Hidradenitis Suppurativa, currently flaring on the right nipple  - S/p unroofing procedure in the bilateral inguinal folds in 04/2018  - Continue adalimumab 80mg  weekly   - Continue clindamycin 300mg  po bid and rifampin 300mg  po bid. Tolerating well.   - Discussed treatment options including using warm compresses at home vs numbing and doing an I&D. Patient opts for I&D, as below:    I&D with punch tool on the R nipple  - After verbal informed consent was obtained, the site was cleansed with alcohol and anesthetized with 1% lidocaine with epinephrine. I&D was performed with an 4 mm punch tool, down to subcutaneous fat, and hemostasis was achieved with pressure. Skin specimen was sent for routine histology. Petrolatum and a bandage were applied to the wound. The patient tolerated the procedure well and post-care instructions were given.    High Risk Medication Monitoring:  - Quant gold ordered today    No follow-ups on file. post op follow up     SUBJECTIVE:    CC: Hidradenitis Suppurativa    Krista Williamson is a 48 y.o. female last seen by Dr. Janyth Contes in 04/2018 for HS and returns today for follow-up on the same. At the last visit, she had an unroofing procedure on her bilateral inguinal folds, which has healed well. She continues taking humira 80mg  weekly and clindamycin and rifampin twice daily. She states that she started to flare on her right breast one week ago. It has not started to drain. She states that she has never had a flare there previously.     Self-reported severity (0-5): 5  VAS pain today: 10  VAS average pain for the last month: Did not answer  Requiring pain medication? Yes.  If so, what type/frequency? Hydrocodone 7.5-325mg   How often in pain?  continuously  Level of odor (0-5): 0  Level of itching (0-5): 0  Dressing changes needed for drainage:No drainage/less than once weekly  How much drainage: no drainage  Flare in the last month (Y/N)? Yes.  Developing new lesions? less than monthly  Number of inflammatory lesions montly: 1-3  DLQI: 26  Current treatment: Humira 80mg  weekly, clindamycin and rifampin BID. Did not answer chart below.    How helpful is the current treatment in managing the following aspects of your disease?  Not at all helpful Somewhat helpful Very helpful   Pain      Decreasing length of flares      Decreasing new lesions      Drainage      Decreasing frequency of flares      Decreasing severity of flares      Odor          Disease course:  Year when symptoms first noticed: 35  Year of diagnosis: 1999  Location of first symptoms: groin and buttocks  Typical involved areas include: groin and buttocks  Typical number of inflammatory lesions each month at baseline (from first visit): 1-3  Disease triggers: none noted    Prior treatments:  Topical: none  Systemic: adalimumab 80mg  weekly (increased spring 2019), clinda/rifampin, Augmentin, Keflex, doxycycline, Bactrim  Past surgical procedures: I&D groin  Past laser procedures: none    ROS: A balance of 10 systems was reviewed and negative.     OBJECTIVE:   Gen: Well-appearing patient, appropriate, interactive, in no acute distress  Skin: Examination of the scalp, face, neck, chest, back, abdomen, bilateral upper and lower extremities, hands, palms, soles, nails, buttocks, and external  genitalia performed today and pertinent for:     location Abscess Inflamed nodule Non-inflamed nodule Draining sinus Non-draining Sinus Hurley % scar   R axilla          L axilla          R inframammary          L inframammary          Intermammary          Pubic          R inguinal          R thigh          L inguinal          L thigh          Scrotum/Vulva          Perianal          R buttock          L buttock          Other (list)          R nipple 1           AN count (total sum of abscess and inflammatory nodule): 1  Pilonidal sinus (Y/N, or previously treated)? No.  Approximate BSA involved by inflammatory lesions: <1% Intertriginous comedones: few  Diffuse comedones (trunk, face, etc): none  Acne scars: none  Cribriform scarring: No.  Intertrigionus epidermal inclusion cysts: 1-3  Diffuse (trunk, face, extremities) epidermal inclusion cysts: none  Regular phenotype  -sites not commented on demonstrate normal findings.    The patient was seen and examined by Leonette Nutting, MD who agrees with the assessment and plan as above.

## 2018-10-26 NOTE — Unmapped (Signed)
Pt is scheduled today @ 145, she was told by the breast center that it was urgent that she see you

## 2018-10-27 MED FILL — HUMIRA PEN CITRATE FREE 40 MG/0.4 ML: 28 days supply | Qty: 8 | Fill #1 | Status: AC

## 2018-10-27 MED FILL — HUMIRA PEN CITRATE FREE 40 MG/0.4 ML: SUBCUTANEOUS | 28 days supply | Qty: 8 | Fill #1

## 2018-10-27 NOTE — Unmapped (Signed)
I saw and evaluated the patient, participating in the key elements of the service.  I discussed the findings, assessment and plan with the resident and agree with resident’s findings and plan as documented in the resident's note.  I was immediately available for the entirety of the procedure(s) and present for the key and critical portions. Sarann Tregre J Remington Highbaugh, MD

## 2018-10-29 NOTE — Unmapped (Signed)
10/28/2018 - Informed patient of benign results via mychart. LNC    Site: Right nipple  Diagnosis: dense dermal suppurative and chronic inflammation with fibrosis, changes that can be seen in an abscess or HS.  Tracking? No

## 2018-11-04 MED ORDER — HUMIRA PEN CITRATE FREE 40 MG/0.4 ML
SUBCUTANEOUS | 6 refills | 28.00000 days | Status: CP
Start: 2018-11-04 — End: ?
  Filled 2018-11-24: qty 8, 28d supply, fill #0

## 2018-11-14 NOTE — Unmapped (Signed)
[name] reports no issues with her Humira. I did clarify she is NOT taking doxycycline at this time (shows filled at outside pharmacy). I explained there is drug interaction with rifampin and doxy - decreased AUC by ~50% due to induced metabolism. She continues to take clindamycin and rifampin.    [facility] Clinical Assessment & Refill Coordination Note    Krista Williamson, DOB: 05/25/1971  Phone: [phone] (home)     All above HIPAA information was verified with patient.     Specialty Medication(s):   Inflammatory Disorders: Humira     Current Outpatient Medications   Medication Sig Dispense Refill   ??? acetaminophen (TYLENOL) 325 MG tablet Frequency:   Dosage:0.0     Instructions:  Note:     ??? atorvastatin (LIPITOR) 10 MG tablet Take 10 mg by mouth.     ??? buPROPion (WELLBUTRIN SR) 150 MG 12 hr tablet Take 150 mg by mouth.     ??? cephalexin (KEFLEX) 500 MG capsule Take 500 mg by mouth Two (2) times a day.     ??? clindamycin (CLEOCIN) 300 MG capsule Take 300 MG BID with food 60 capsule 3   ??? clonazePAM (KLONOPIN) 1 MG tablet Take 1 mg by mouth daily as needed for anxiety.     ??? cyclobenzaprine (FLEXERIL) 10 MG tablet      ??? diazePAM (VALIUM) 5 MG tablet Take 2 tablets (10mg ) one hour prior to dermatology procedure 4 tablet 0   ??? doxycycline (VIBRAMYCIN) 100 MG capsule      ??? HUMIRA PEN CITRATE FREE 40 MG/0.4 ML Inject the contents of 2 pens (80 mg total) under the skin once a week. 8 each 6   ??? HYDROcodone-acetaminophen (NORCO) 5-325 mg per tablet Take 1 tablet by mouth.     ??? ibuprofen (ADVIL,MOTRIN) 200 MG tablet Take 400 mg by mouth.     ??? lubiprostone (AMITIZA) 8 MCG capsule Take 8 mcg by mouth 2 (two) times a day with meals.     ??? naproxen sodium (ALEVE) 220 MG tablet Take 440-660 mg by mouth.     ??? ofloxacin (OCUFLOX) 0.3 % ophthalmic solution Apply 5 drops in left ear twice daily for 7 days     ??? rifAMPin (RIFADIN) 300 MG capsule Take 1 capsule (300 mg total) by mouth Two (2) times a day. 60 capsule 2   ??? traMADol (ULTRAM) 50 mg tablet Take 1 tablet (50 mg total) by mouth every six (6) hours as needed for pain. 10 tablet 0     No current facility-administered medications for this visit.         Changes to medications: [name] reports no changes reported at this time.    No Known Allergies    Changes to allergies: No    SPECIALTY MEDICATION ADHERENCE     Humira - ~ 4 pens remaining   Medication Adherence    Patient reported X missed doses in the last month:  0  Specialty Medication:  Humira          Specialty medication(s) dose(s) confirmed: Regimen is correct and unchanged.     Are there any concerns with adherence? No    Adherence counseling provided? Not needed    CLINICAL MANAGEMENT AND INTERVENTION      Clinical Benefit Assessment:    Do you feel the medicine is effective or helping your condition? Yes    Clinical Benefit counseling provided? Not needed    Adverse Effects Assessment:  Are you experiencing any side effects? No    Are you experiencing difficulty administering your medicine? No    Quality of Life Assessment:    How many days over the past month did your HS  keep you from your normal activities? For example, brushing your teeth or getting up in the morning. 0    Have you discussed this with your provider? Not needed    Therapy Appropriateness:    Is therapy appropriate? Yes, therapy is appropriate and should be continued    DISEASE/MEDICATION-SPECIFIC INFORMATION      For patients on injectable medications: Patient currently has 2 doses left.  Next injection is scheduled for this weekend.    PATIENT SPECIFIC NEEDS     ? Does the patient have any physical, cognitive, or cultural barriers? No    ? Is the patient high risk? No     ? Does the patient require a Care Management Plan? No     ? Does the patient require physician intervention or other additional services (i.e. nutrition, smoking cessation, social work)? No      SHIPPING     Specialty Medication(s) to be Shipped:   Inflammatory Disorders: Humira    Other medication(s) to be shipped: na     Changes to insurance: No    Delivery Scheduled: Yes, Expected medication delivery date: Friday, April 24.     Medication will be delivered via UPS to the confirmed home address in [facility].    The patient will receive a drug information handout for each medication shipped and additional FDA Medication Guides as required.  Verified that patient has previously received a Conservation officer, historic buildings.    [name]   [facility] Specialty Pharmacist

## 2018-11-24 MED FILL — HUMIRA PEN CITRATE FREE 40 MG/0.4 ML: 28 days supply | Qty: 8 | Fill #0 | Status: AC

## 2019-01-06 NOTE — Unmapped (Signed)
[facility] Refill Coordination Note    Specialty Medication(s) to be Shipped:   Inflammatory Disorders: Humira    Other medication(s) to be shipped: na     Krista Williamson, DOB: 1971/02/13  Phone: [phone] (home)       All above HIPAA information was verified with patient.     Completed refill call assessment today to schedule patient's medication shipment from the [facility] [phone]).       Specialty medication(s) and dose(s) confirmed: Regimen is correct and unchanged.   Changes to medications: Averly reports no changes at this time.  Changes to insurance: No  Questions for the pharmacist: No    Confirmed patient received Welcome Packet with first shipment. The patient will receive a drug information handout for each medication shipped and additional FDA Medication Guides as required.       DISEASE/MEDICATION-SPECIFIC INFORMATION        N/A    SPECIALTY MEDICATION ADHERENCE     Medication Adherence    Patient reported X missed doses in the last month:  0  Specialty Medication:  Humira CF 40 mg/0.4 ml   Patient is on additional specialty medications:  No  Patient is on more than two specialty medications:  No  Any gaps in refill history greater than 2 weeks in the last 3 months:  no  Demonstrates understanding of importance of adherence:  yes  Informant:  patient  Reliability of informant:  reliable  Confirmed plan for next specialty medication refill:  delivery by pharmacy  Refills needed for supportive medications:  not needed              Humira CF 40 mg/0.4 ml . 2 pens for tomorrow's dose remaining      SHIPPING     Shipping address confirmed in Epic.     Delivery Scheduled: Yes, Expected medication delivery date: 060920.     Medication will be delivered via UPS to the prescription address in [employer] [facility].    Camala Talwar D Davone Shinault   [facility] Shared [facility] Specialty Technician

## 2019-01-09 MED FILL — HUMIRA PEN CITRATE FREE 40 MG/0.4 ML: SUBCUTANEOUS | 28 days supply | Qty: 8 | Fill #1

## 2019-01-09 MED FILL — HUMIRA PEN CITRATE FREE 40 MG/0.4 ML: 28 days supply | Qty: 8 | Fill #1 | Status: AC

## 2019-02-13 NOTE — Unmapped (Signed)
[facility] Refill Coordination Note    Specialty Medication(s) to be Shipped:   Inflammatory Disorders: Humira    Other medication(s) to be shipped: na     Krista Williamson, DOB: 06-07-1971  Phone: [phone] (home)       All above HIPAA information was verified with patient.     Completed refill call assessment today to schedule patient's medication shipment from the [facility] [phone]).       Specialty medication(s) and dose(s) confirmed: Regimen is correct and unchanged.   Changes to medications: Krista Williamson reports no changes at this time.  Changes to insurance: No  Questions for the pharmacist: No    Confirmed patient received Welcome Packet with first shipment. The patient will receive a drug information handout for each medication shipped and additional FDA Medication Guides as required.       DISEASE/MEDICATION-SPECIFIC INFORMATION        N/A    SPECIALTY MEDICATION ADHERENCE     Medication Adherence    Patient reported X missed doses in the last month: 0  Specialty Medication: Humira CF 40 mg/0.4 ml   Patient is on additional specialty medications: No  Patient is on more than two specialty medications: No  Any gaps in refill history greater than 2 weeks in the last 3 months: no  Demonstrates understanding of importance of adherence: yes  Informant: patient  Reliability of informant: reliable  Confirmed plan for next specialty medication refill: delivery by pharmacy  Refills needed for supportive medications: not needed           Humira CF 40 mg/0.4 ml . 0 on hand. Next dose is due 7/16            SHIPPING     Shipping address confirmed in Epic.     Delivery Scheduled: Yes, Expected medication delivery date: 071520.     Medication will be delivered via UPS to the prescription address in Epic [facility].    Krista Williamson D Layne Lebon   [facility] Specialty Technician

## 2019-02-14 MED FILL — HUMIRA PEN CITRATE FREE 40 MG/0.4 ML: SUBCUTANEOUS | 28 days supply | Qty: 8 | Fill #2

## 2019-02-14 MED FILL — HUMIRA PEN CITRATE FREE 40 MG/0.4 ML: 28 days supply | Qty: 8 | Fill #2 | Status: AC

## 2019-03-13 NOTE — Unmapped (Signed)
[facility] Refill Coordination Note    Specialty Medication(s) to be Shipped:   Inflammatory Disorders: Humira    Other medication(s) to be shipped: na     Krista Williamson, DOB: 12/29/1970  Phone: [phone] (home)       All above HIPAA information was verified with patient.     Completed refill call assessment today to schedule patient's medication shipment from the [facility] [phone]).       Specialty medication(s) and dose(s) confirmed: Regimen is correct and unchanged.   Changes to medications: Krista Williamson reports no changes at this time.  Changes to insurance: No  Questions for the pharmacist: No    Confirmed patient received Welcome Packet with first shipment. The patient will receive a drug information handout for each medication shipped and additional FDA Medication Guides as required.       DISEASE/MEDICATION-SPECIFIC INFORMATION        For patients on injectable medications: Patient currently has 1 doses left.  Next injection is scheduled for [id number].    SPECIALTY MEDICATION ADHERENCE     Medication Adherence    Patient reported X missed doses in the last month: 0  Specialty Medication: Humira CF 40 mg/0.4 ml  Patient is on additional specialty medications: No  Patient is on more than two specialty medications: No  Any gaps in refill history greater than 2 weeks in the last 3 months: no  Demonstrates understanding of importance of adherence: yes  Informant: patient  Reliability of informant: reliable  Confirmed plan for next specialty medication refill: delivery by pharmacy  Refills needed for supportive medications: not needed                Humira CF 40 mg/0.4 ml. 7 days on hand. Patient thinks she has 2 pens left but isn't sure      SHIPPING     Shipping address confirmed in Epic.     Delivery Scheduled: Yes, Expected medication delivery date: 081320.     Medication will be delivered via UPS to the prescription address in Epic [facility].    Krista Williamson D Kong Packett   [facility] Shared [facility] Specialty Technician

## 2019-03-15 MED FILL — HUMIRA PEN CITRATE FREE 40 MG/0.4 ML: 28 days supply | Qty: 8 | Fill #3 | Status: AC

## 2019-03-15 MED FILL — HUMIRA PEN CITRATE FREE 40 MG/0.4 ML: SUBCUTANEOUS | 28 days supply | Qty: 8 | Fill #3

## 2019-04-18 NOTE — Unmapped (Signed)
Phone triage completed for appt scheduled 04/19/2019.

## 2019-04-19 ENCOUNTER — Encounter: Admit: 2019-04-19 | Discharge: 2019-04-20 | Payer: MEDICARE

## 2019-04-19 DIAGNOSIS — L910 Hypertrophic scar: Secondary | ICD-10-CM

## 2019-04-19 DIAGNOSIS — Z79899 Other long term (current) drug therapy: Secondary | ICD-10-CM

## 2019-04-19 DIAGNOSIS — L732 Hidradenitis suppurativa: Secondary | ICD-10-CM

## 2019-04-19 NOTE — Unmapped (Signed)
ASSESSMENT/PLAN:    Hidradenitis Suppurativa, currently flaring on the right nipple  - S/p unroofing procedure in the bilateral inguinal folds in 04/2018 with excellent improvement in those areas.  This is second episode affecting the nipple.  - Continue adalimumab 80mg  weekly.  Will check quantiferon gold today.  - Continue clindamycin 300mg  po bid and rifampin 300mg  po bid. Tolerating well.   - Discussed treatment options including using warm compresses at home vs numbing and doing an I&D. Patient opts for I&D, as below:    I&D with punch tool on the R nipple  - After verbal informed consent was obtained, the site was cleansed with alcohol and anesthetized with 1% lidocaine with epinephrine. I&D was performed with an 4 mm punch tool, down to subcutaneous fat, and hemostasis was achieved with pressure. Skin specimen was sent for routine histology. Petrolatum and a bandage were applied to the wound. The patient tolerated the procedure well and post-care instructions were given.      No follow-ups on file. post op follow up     SUBJECTIVE:    CC: Hidradenitis Suppurativa    Krista Williamson is a 48 y.o. female last seen by Dr. Janyth Contes in 10/2018 for HS and returns today for follow-up of the same.  She continues taking humira 80mg  weekly and clindamycin and rifampin twice daily. This has kept things mostly under good control.  She states that she started to flare on her right breast a few das ago. It has not started to drain.   This is the second time it has flared in this location and previously did well with I&D.  Tolerating medications well.      Disease course:  Year when symptoms first noticed: 75  Year of diagnosis: 1999  Location of first symptoms: groin and buttocks  Typical involved areas include: groin and buttocks  Typical number of inflammatory lesions each month at baseline (from first visit): 1-3  Disease triggers: none noted    Prior treatments:  Topical: none  Systemic: adalimumab 80mg  weekly (increased spring 2019), clinda/rifampin, Augmentin, Keflex, doxycycline, Bactrim  Past surgical procedures: I&D groin  Past laser procedures: none    ROS: A balance of 10 systems was reviewed and negative.     OBJECTIVE:   Gen: Well-appearing patient, appropriate, interactive, in no acute distress  Skin: Examination of the scalp, face, neck, chest, back, abdomen, bilateral upper and lower extremities, hands, palms, soles, nails, buttocks, and external genitalia performed today and pertinent for:     location Abscess Inflamed nodule Non-inflamed nodule Draining sinus Non-draining Sinus Hurley % scar   R axilla          L axilla          R inframammary          L inframammary          Intermammary          Pubic          R inguinal          R thigh          L inguinal          L thigh          Scrotum/Vulva          Perianal          R buttock          L buttock          Other (list)  R nipple 1           AN count (total sum of abscess and inflammatory nodule): 1  Pilonidal sinus (Y/N, or previously treated)? No.  Approximate BSA involved by inflammatory lesions: <1%  Intertriginous comedones: few  Diffuse comedones (trunk, face, etc): none  Acne scars: none  Cribriform scarring: No.  Intertrigionus epidermal inclusion cysts: 1-3  Diffuse (trunk, face, extremities) epidermal inclusion cysts: none  Regular phenotype  -sites not commented on demonstrate normal findings.    The patient was seen and examined by Leonette Nutting, MD who agrees with the assessment and plan as above.

## 2019-04-19 NOTE — Unmapped (Signed)
Punch biopsy  Punch biopsy involves numbing a small area of your skin, then obtaining a sample to help [place] make a proper diagnosis of your skin condition. The biopsy site is typically closed with one to 3 small stitches to help the site heal. Biopsy results will usually return in 7-14 days.    To care for the area: Leave the bandage in place until the morning after your procedure is performed. On a daily basis, carefully remove the bandage, then shower or wash as usual. Allow water to run over the site. Please do not scrub. Carefully dry the area, then apply ointment (some people develop an allergy to Neosporin, so we recommend Vaseline or Aquaphor). Cover the site with a fresh bandage. Should any bleeding occur, apply firm pressure for 15 minutes. The treated site will heal best if  a scab never forms (the wound heals by new skin cells traveling from the outside toward the middle-their journey is easier if no scab stands in their way).    Long-term care: the site will be more sensitive than your surrounding skin. Keep it covered, and remember to apply sunscreen every day to all your exposed skin. A scar may remain which is lighter or pinker than your normal skin. Your body will continue to improve your scar for up to one year.    Infection following this procedure is rare. However, if you are worried about the appearance of your site, contact your doctor. Complete healing of the site may take up to one month. We have a physician on call at all times. If you have any concerns about the site, please call our clinic at [phone]    Your stitches should be removed in 1 to 2 weeks.

## 2019-04-21 LAB — QUANTIFERON TB GOLD PLUS
QUANTIFERON ANTIGEN 1 MINUS NIL: 0 [IU]/mL
QUANTIFERON ANTIGEN 2 MINUS NIL: 0 [IU]/mL
QUANTIFERON MITOGEN: >9.89 [IU]/mL
QUANTIFERON TB NIL VALUE: 0.11 [IU]/mL

## 2019-04-21 LAB — TB NIL VALUE: Lab: 0.11

## 2019-04-21 LAB — TB MITOGEN VALUE: Lab: >10

## 2019-04-21 LAB — TB AG2 VALUE: Lab: 0.11

## 2019-04-21 LAB — TB AG1 VALUE: Lab: 0.11

## 2019-04-21 LAB — QUANTIFERON ANTIGEN 1 MINUS NIL: Lab: 0

## 2019-04-21 NOTE — Unmapped (Signed)
No tracking needed. Contacted patient with result via MyChart on 04/20/19

## 2019-04-25 ENCOUNTER — Encounter: Payer: Self-pay | Admitting: Gynecology

## 2019-04-26 NOTE — Unmapped (Signed)
Krista Williamson reports doing well on her Humira. She continues clindamycin and rifampin as part of her HS treatment.  She recently underwent a CT scan for some recurrent sinus infections - she's not sure yet what results/recommended treatments are.     We reviewed injection areas - top of thighs, belly, or back of upper arm (if someone is helping). She has been having bruising on left left - and will plan to have husband help her with the back of her arms.     [facility] Shared [facility] Clinical Assessment & Refill Coordination Note    ARABEL Williamson, DOB: [birth year]  Phone: [phone] (home)     All above HIPAA information was verified with patient.     Specialty Medication(s):   Inflammatory Disorders: Humira     Current Outpatient Medications   Medication Sig Dispense Refill   ??? acetaminophen (TYLENOL) 325 MG tablet Frequency:   Dosage:0.0     Instructions:  Note:     ??? atorvastatin (LIPITOR) 10 MG tablet Take 10 mg by mouth.     ??? buPROPion (WELLBUTRIN SR) 150 MG 12 hr tablet Take 150 mg by mouth.     ??? cephalexin (KEFLEX) 500 MG capsule Take 500 mg by mouth Two (2) times a day.     ??? clindamycin (CLEOCIN) 300 MG capsule Take 300 MG BID with food 60 capsule 3   ??? clonazePAM (KLONOPIN) 1 MG tablet Take 1 mg by mouth daily as needed for anxiety.     ??? HUMIRA PEN CITRATE FREE 40 MG/0.4 ML Inject the contents of 2 pens (80 mg total) under the skin once a week. 8 each 6   ??? HYDROcodone-acetaminophen (NORCO) 5-325 mg per tablet Take 1 tablet by mouth.     ??? ibuprofen (ADVIL,MOTRIN) 200 MG tablet Take 400 mg by mouth.     ??? lubiprostone (AMITIZA) 8 MCG capsule Take 8 mcg by mouth 2 (two) times a day with meals.     ??? naproxen sodium (ALEVE) 220 MG tablet Take 440-660 mg by mouth.     ??? ofloxacin (OCUFLOX) 0.3 % ophthalmic solution Apply 5 drops in left ear twice daily for 7 days     ??? rifAMPin (RIFADIN) 300 MG capsule Take 1 capsule (300 mg total) by mouth Two (2) times a day. 60 capsule 2   ??? traMADol (ULTRAM) 50 mg tablet Take 1 tablet (50 mg total) by mouth every six (6) hours as needed for pain. 10 tablet 0     No current facility-administered medications for this visit.         Changes to medications: Rachele reports no changes at this time.    No Known Allergies    Changes to allergies: No    SPECIALTY MEDICATION ADHERENCE     Humira - 1 dose left    Medication Adherence    Patient reported X missed doses in the last month: 1  Specialty Medication: Humira CF 40 mg/0.4 ml  Patient is on additional specialty medications: No          Specialty medication(s) dose(s) confirmed: Regimen is correct and unchanged.     Are there any concerns with adherence? No    Adherence counseling provided? Not needed    CLINICAL MANAGEMENT AND INTERVENTION      Clinical Benefit Assessment:    Do you feel the medicine is effective or helping your condition? Yes    Clinical Benefit counseling provided? Not needed    Adverse  Effects Assessment:    Are you experiencing any side effects? No    Are you experiencing difficulty administering your medicine? No    Quality of Life Assessment:    How many days over the past month did your HS  keep you from your normal activities? For example, brushing your teeth or getting up in the morning. 0    Have you discussed this with your provider? Not needed    Therapy Appropriateness:    Is therapy appropriate? Yes, therapy is appropriate and should be continued    DISEASE/MEDICATION-SPECIFIC INFORMATION      For patients on injectable medications: Patient currently has 1 doses left.  Next injection is scheduled for TODAY.    PATIENT SPECIFIC NEEDS     ? Does the patient have any physical, cognitive, or cultural barriers? No    ? Is the patient high risk? No     ? Does the patient require a Care Management Plan? No     ? Does the patient require physician intervention or other additional services (i.e. nutrition, smoking cessation, social work)? No      SHIPPING     Specialty Medication(s) to be Shipped:   Inflammatory Disorders: Humira    Other medication(s) to be shipped: na     Changes to insurance: No    Delivery Scheduled: Yes, Expected medication delivery date: Tues, Sept 29.     Medication will be delivered via UPS to the confirmed home address in [facility].    The patient will receive a drug information handout for each medication shipped and additional FDA Medication Guides as required.  Verified that patient has previously received a Conservation officer, historic buildings.    All of the patient's questions and concerns have been addressed.    Krista Williamson   [facility] Shared [facility] Specialty Pharmacist

## 2019-05-01 MED FILL — HUMIRA PEN CITRATE FREE 40 MG/0.4 ML: 28 days supply | Qty: 8 | Fill #4 | Status: AC

## 2019-05-01 MED FILL — HUMIRA PEN CITRATE FREE 40 MG/0.4 ML: SUBCUTANEOUS | 28 days supply | Qty: 8 | Fill #4

## 2019-05-24 NOTE — Unmapped (Signed)
[facility] Special[facility] Pharm[facility] Refill Coordination Note    Specialty Medication(s) to be Shipped:   Inflammatory Disorders: Humira    Other medication(s) to be shipped: na     Krista Williamson, DOB: 1971-03-08  Phone: [phone] (home)       All above HIPAA information was verified with patient.     Completed refill call assessment today to schedule patient's medication shipment from the [facility] [phone]).       Specialty medication(s) and dose(s) confirmed: Regimen is correct and unchanged.   Changes to medications: [name] reports no changes at this time.  Changes to insurance: No  Questions for the pharmacist: No    Confirmed patient received Welcome Packet with first shipment. The patient will receive a drug information handout for each medication shipped and additional FDA Medication Guides as required.       DISEASE/MEDICATION-SPECIFIC INFORMATION        For patients on injectable medications: Patient currently has 1 doses left.  Next injection is scheduled for 102120.    SPECIALTY MEDICATION ADHERENCE     Medication Adherence    Patient reported X missed doses in the last month: 0  Specialty Medication: Humira CF 40 mg/0.4 ml  Patient is on additional specialty medications: No  Any gaps in refill history greater than 2 weeks in the last 3 months: no  Demonstrates understanding of importance of adherence: yes  Informant: patient  Reliability of informant: reliable  Confirmed plan for next specialty medication refill: delivery by pharmacy  Refills needed for supportive medications: not needed                Humira CF 40 mg/0.4 ml. 7 days on hand      SHIPPING     Shipping address confirmed in Epic.     Delivery Scheduled: Yes, Expected medication delivery date: 102720.     Medication will be delivered via UPS to the prescription address in Epic [facility].    Benjy Kana D Trexton Escamilla   [facility] Shared [facility] Specialty Technician

## 2019-05-29 MED FILL — HUMIRA PEN CITRATE FREE 40 MG/0.4 ML: 28 days supply | Qty: 8 | Fill #5 | Status: AC

## 2019-05-29 MED FILL — HUMIRA PEN CITRATE FREE 40 MG/0.4 ML: SUBCUTANEOUS | 28 days supply | Qty: 8 | Fill #5

## 2019-06-15 NOTE — Unmapped (Signed)
[facility] Special[facility] Pharm[facility] Refill Coordination Note    Specialty Medication(s) to be Shipped:   Inflammatory Disorders: Humira    Other medication(s) to be shipped: n/a     Krista Williamson, DOB: March 03, 1971  Phone: [phone] (home)       All above HIPAA information was verified with patient.     Completed refill call assessment today to schedule patient's medication shipment from the [facility] [phone]).       Specialty medication(s) and dose(s) confirmed: Regimen is correct and unchanged.   Changes to medications: Krista Williamson reports no changes at this time.  Changes to insurance: No  Questions for the pharmacist: No    Confirmed patient received Welcome Packet with first shipment. The patient will receive a drug information handout for each medication shipped and additional FDA Medication Guides as required.       DISEASE/MEDICATION-SPECIFIC INFORMATION        For patients on injectable medications: Patient currently has 1 doses left.  Next injection is scheduled for 06/17/2019.    SPECIALTY MEDICATION ADHERENCE     Medication Adherence    Patient reported X missed doses in the last month: 0  Specialty Medication: Humira CF 40 mg/0.4 ml  Patient is on additional specialty medications: No  Any gaps in refill history greater than 2 weeks in the last 3 months: no  Demonstrates understanding of importance of adherence: yes  Informant: patient  Reliability of informant: reliable  Confirmed plan for next specialty medication refill: delivery by pharmacy  Refills needed for supportive medications: not needed                Humira CF 40 mg/0.4 ml. 7 days on hand      SHIPPING     Shipping address confirmed in Epic.     Delivery Scheduled: Yes, Expected medication delivery date: 06/21/2019.     Medication will be delivered via UPS to the prescription address in Epic W[facility].    Krista Williamson D Dreyton Roessner   [facility] Shared [facility] Specialty Technician

## 2019-06-20 ENCOUNTER — Other Ambulatory Visit: Payer: Self-pay

## 2019-06-20 ENCOUNTER — Encounter (INDEPENDENT_AMBULATORY_CARE_PROVIDER_SITE_OTHER): Payer: Self-pay | Admitting: Otolaryngology

## 2019-06-20 ENCOUNTER — Encounter (INDEPENDENT_AMBULATORY_CARE_PROVIDER_SITE_OTHER): Payer: Self-pay

## 2019-06-20 ENCOUNTER — Ambulatory Visit (INDEPENDENT_AMBULATORY_CARE_PROVIDER_SITE_OTHER): Payer: Medicare Other | Admitting: Otolaryngology

## 2019-06-20 VITALS — Temp 97.3°F

## 2019-06-20 DIAGNOSIS — J3081 Allergic rhinitis due to animal (cat) (dog) hair and dander: Secondary | ICD-10-CM | POA: Diagnosis not present

## 2019-06-20 DIAGNOSIS — J342 Deviated nasal septum: Secondary | ICD-10-CM

## 2019-06-20 DIAGNOSIS — H6523 Chronic serous otitis media, bilateral: Secondary | ICD-10-CM

## 2019-06-20 MED FILL — HUMIRA PEN CITRATE FREE 40 MG/0.4 ML: 28 days supply | Qty: 8 | Fill #6 | Status: AC

## 2019-06-20 MED FILL — HUMIRA PEN CITRATE FREE 40 MG/0.4 ML: SUBCUTANEOUS | 28 days supply | Qty: 8 | Fill #6
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
## 2019-06-21 ENCOUNTER — Other Ambulatory Visit: Payer: Self-pay

## 2019-06-21 ENCOUNTER — Ambulatory Visit (INDEPENDENT_AMBULATORY_CARE_PROVIDER_SITE_OTHER): Payer: Medicare Other | Admitting: Allergy

## 2019-06-21 ENCOUNTER — Encounter: Payer: Self-pay | Admitting: Allergy

## 2019-06-21 VITALS — BP 118/80 | HR 88 | Temp 98.1°F | Resp 18 | Ht 64.0 in | Wt 140.8 lb

## 2019-06-21 DIAGNOSIS — J310 Chronic rhinitis: Secondary | ICD-10-CM | POA: Diagnosis not present

## 2019-06-21 DIAGNOSIS — R059 Cough, unspecified: Secondary | ICD-10-CM

## 2019-06-21 DIAGNOSIS — J449 Chronic obstructive pulmonary disease, unspecified: Secondary | ICD-10-CM | POA: Insufficient documentation

## 2019-06-21 DIAGNOSIS — R05 Cough: Secondary | ICD-10-CM | POA: Diagnosis not present

## 2019-06-21 DIAGNOSIS — Z720 Tobacco use: Secondary | ICD-10-CM | POA: Diagnosis not present

## 2019-06-21 MED ORDER — LEVOCETIRIZINE DIHYDROCHLORIDE 5 MG PO TABS: 5.0000 mg | ORAL_TABLET | Freq: Every evening | ORAL | 5 refills | Status: DC

## 2019-06-21 MED ORDER — ALBUTEROL SULFATE HFA 108 (90 BASE) MCG/ACT IN AERS: 2.0000 | INHALATION_SPRAY | RESPIRATORY_TRACT | 2 refills | Status: DC | PRN

## 2019-06-21 MED ORDER — AZELASTINE HCL 0.1 % NA SOLN: NASAL | 5 refills | Status: DC

## 2019-06-21 MED ORDER — ADVAIR HFA 230-21 MCG/ACT IN AERO: 2.0000 | INHALATION_SPRAY | Freq: Two times a day (BID) | RESPIRATORY_TRACT | 5 refills | Status: DC

## 2019-06-21 NOTE — Patient Instructions (Addendum)
Asthma: . Get CXR . Stop smoking. . Daily controller medication(s): start Advair 230 2 puffs twice a day with spacer and rinse mouth afterwards. . Prior to physical activity: May use albuterol rescue inhaler 2 puffs 5 to 15 minutes prior to strenuous physical activities. Marland Kitchen Rescue medications: May use albuterol rescue inhaler 2 puffs or nebulizer every 4 to 6 hours as needed for shortness of breath, chest tightness, coughing, and wheezing. Monitor frequency of use.  . Asthma control goals:  o Full participation in all desired activities (may need albuterol before activity) o Albuterol use two times or less a week on average (not counting use with activity) o Cough interfering with sleep two times or less a month o Oral steroids no more than once a year o No hospitalizations  Sinus congestion: . Continue Flonase 1 spray per nostril twice a day. . Start azelastine nasal spray 1-2 sprays per nostril 1-2 times a day as needed for drainage. . Start xyzal 5mg  daily at night. This replaces zyrtec. Marland Kitchen Get bloodwork to check for environmental allergies. o We are ordering labs, so please allow 1-2 weeks for the results to come back. o With the newly implemented Cures Act, the labs might be visible to you at the same time that they become visible to me. However, I will not address the results until all of the results are back, so please be patient.   Start saline irrigations 1-2 times a day and do it before you use your nasal sprays.  Follow up in 4 weeks.   Buffered Isotonic Saline Irrigations:  Goal: . When you irrigate with the isotonic saline (salt water) it washes mucous and other debris from your nose that could be contributing to your nasal symptoms.   Recipe: Marland Kitchen Obtain 1 quart jar that is clean . Fill with clean (bottled, boiled or distilled) water . Add 1-2 heaping teaspoons of salt without iodine o If the solution with 2 teaspoons of salt is too strong, adjust the amount down until  better tolerated . Add 1 teaspoon of [employer] baking soda (pure bicarbonate) . Mix ingredients together and store at room temperature and discard after 1 week * Alternatively you can buy pre made salt packets for the [employer] bottle or there          are other over the counter brands available  Instructions: . Warm  cup of the solution in the microwave if desired but be careful not to overheat as this will burn the inside of your nose . Stand over a sink (or do it while you shower) and squirt the solution into one side of your nose aiming towards the back of your head o Sometimes saying "k k k k k" while irrigating can be helpful to prevent fluid from going down your throat  . The solution will travel to the back of your nose and then come out the other side . Perform this again on the other side . Try to do this twice a day . If you are using a nasal spray in addition to the irrigation, irrigate first and then use the topical nasal spray otherwise you will wash the nasal spray out of your nose
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
## 2019-06-22 ENCOUNTER — Ambulatory Visit
Admission: RE | Admit: 2019-06-22 | Discharge: 2019-06-22 | Disposition: A | Discharge status: Home / Self Care | Payer: Medicare Other | Source: Ambulatory Visit | Attending: Allergy | Admitting: Allergy

## 2019-06-22 DIAGNOSIS — R05 Cough: Secondary | ICD-10-CM | POA: Insufficient documentation

## 2019-06-22 NOTE — Assessment & Plan Note (Signed)
Perennial rhinoconjunctivitis symptoms for the last 1 year.  Having some anosmia on and off.  Used Zyrtec, prednisone, Flonase with no benefit.  ENT evaluation showed deviated septum. . Continue Flonase 1 spray per nostril twice a day. . Start azelastine nasal spray 1-2 sprays per nostril 1-2 times a day as needed for drainage. . Start xyzal 5mg  daily at night. This replaces zyrtec. . No skin testing today due to poor asthma status.  Get bloodwork to check for environmental allergies. . Start saline irrigations 1-2 times a day and do it before you use your nasal sprays.

## 2019-06-22 NOTE — Assessment & Plan Note (Signed)
.   See assessment and plan as above. 

## 2019-06-22 NOTE — Assessment & Plan Note (Signed)
Patient had asthma as a child and more recently has been using her son's albuterol multiple times a week due to chest tightness, shortness of breath, coughing and wheezing.  Patient is a heavy smoker and used to smoke up to 3 packs/day.  She also has history of reflux. . Today's spirometry showed moderate obstruction with 45% improvement in FEV1 post bronchodilator treatment.  Patient clinically felt better and wheezing resolved. . Order chest x-ray due to prolonged coughing and history of heavy smoking. . Discussed smoking cessation. . Daily controller medication(s): start Advair 230 2 puffs twice a day with spacer and rinse mouth afterwards. o Spacer given and demonstrated proper use with inhaler. Patient understood technique and all questions/concerned were addressed.  . Prior to physical activity: May use albuterol rescue inhaler 2 puffs 5 to 15 minutes prior to strenuous physical activities. Marland Kitchen Rescue medications: May use albuterol rescue inhaler 2 puffs or nebulizer every 4 to 6 hours as needed for shortness of breath, chest tightness, coughing, and wheezing. Monitor frequency of use . Repeat spirometry at next visit.

## 2019-06-22 NOTE — Assessment & Plan Note (Signed)
   Discussed smoking cessation. 

## 2019-06-24 LAB — ALLERGENS W/TOTAL IGE AREA 2
Alternaria Alternata IgE: <0.1 kU/L
Aspergillus Fumigatus IgE: <0.1 kU/L
Bermuda Grass IgE: 0.18 kU/L — AB
Cat Dander IgE: 2.7 kU/L — AB
Cedar, Mountain IgE: <0.1 kU/L
Cladosporium Herbarum IgE: <0.1 kU/L
Cockroach, German IgE: 0.12 kU/L — AB
Common Silver Birch IgE: 0.15 kU/L — AB
Cottonwood IgE: <0.1 kU/L
D Farinae IgE: 0.5 kU/L — AB
D Pteronyssinus IgE: 0.66 kU/L — AB
Dog Dander IgE: 5.4 kU/L — AB
Elm, American IgE: <0.1 kU/L
IgE (Immunoglobulin E), Serum: 102 IU/mL (ref 6–495)
Johnson Grass IgE: 0.19 kU/L — AB
Maple/Box Elder IgE: <0.1 kU/L
Mouse Urine IgE: <0.1 kU/L
Oak, White IgE: <0.1 kU/L
Pecan, Hickory IgE: 1.13 kU/L — AB
Penicillium Chrysogen IgE: <0.1 kU/L
Pigweed, Rough IgE: <0.1 kU/L
Ragweed, Short IgE: 0.32 kU/L — AB
Sheep Sorrel IgE Qn: 0.27 kU/L — AB
Timothy Grass IgE: 0.76 kU/L — AB
White Mulberry IgE: <0.1 kU/L

## 2019-06-24 LAB — CBC WITH DIFFERENTIAL/PLATELET
Basophils Absolute: 0 10*3/uL (ref 0.0–0.2)
Basos: 0 %
EOS (ABSOLUTE): 0.1 10*3/uL (ref 0.0–0.4)
Eos: 1 %
Hematocrit: 44.1 % (ref 34.0–46.6)
Hemoglobin: 15.3 g/dL (ref 11.1–15.9)
Immature Grans (Abs): 0 10*3/uL (ref 0.0–0.1)
Immature Granulocytes: 0 %
Lymphocytes Absolute: 3.8 10*3/uL — ABNORMAL HIGH (ref 0.7–3.1)
Lymphs: 39 %
MCH: 32.3 pg (ref 26.6–33.0)
MCHC: 34.7 g/dL (ref 31.5–35.7)
MCV: 93 fL (ref 79–97)
Monocytes Absolute: 0.7 10*3/uL (ref 0.1–0.9)
Monocytes: 7 %
Neutrophils Absolute: 5.2 10*3/uL (ref 1.4–7.0)
Neutrophils: 53 %
Platelets: 261 10*3/uL (ref 150–450)
RBC: 4.73 x10E6/uL (ref 3.77–5.28)
RDW: 12 % (ref 11.7–15.4)
WBC: 9.8 10*3/uL (ref 3.4–10.8)

## 2019-06-26 ENCOUNTER — Encounter: Payer: Self-pay | Admitting: Allergy

## 2019-07-13 DIAGNOSIS — L732 Hidradenitis suppurativa: Principal | ICD-10-CM

## 2019-07-14 MED ORDER — HUMIRA PEN CITRATE FREE 40 MG/0.4 ML
SUBCUTANEOUS | 6 refills | 28.00000 days | Status: CP
Start: 2019-07-14 — End: ?

## 2019-07-14 NOTE — Unmapped (Signed)
LV: 04/19/19  RV: PRN    Refill pended for Humira 40 mg/0.4 ml, Inject the contents of 2 pens under the skin once a week.

## 2019-07-19 ENCOUNTER — Ambulatory Visit: Payer: Medicare Other | Admitting: Allergy

## 2019-07-19 ENCOUNTER — Other Ambulatory Visit: Payer: Self-pay

## 2019-07-19 ENCOUNTER — Ambulatory Visit (INDEPENDENT_AMBULATORY_CARE_PROVIDER_SITE_OTHER): Payer: Medicare Other | Admitting: Allergy

## 2019-07-19 ENCOUNTER — Encounter: Payer: Self-pay | Admitting: Allergy

## 2019-07-19 DIAGNOSIS — J449 Chronic obstructive pulmonary disease, unspecified: Secondary | ICD-10-CM | POA: Diagnosis not present

## 2019-07-19 DIAGNOSIS — H1010 Acute atopic conjunctivitis, unspecified eye: Secondary | ICD-10-CM | POA: Diagnosis not present

## 2019-07-19 DIAGNOSIS — Z720 Tobacco use: Secondary | ICD-10-CM

## 2019-07-19 DIAGNOSIS — J302 Other seasonal allergic rhinitis: Secondary | ICD-10-CM

## 2019-07-19 DIAGNOSIS — J3089 Other allergic rhinitis: Secondary | ICD-10-CM

## 2019-07-19 NOTE — Assessment & Plan Note (Signed)
   Discussed smoking cessation and encouraged quitting.

## 2019-07-19 NOTE — Assessment & Plan Note (Signed)
Past history - Perennial rhinoconjunctivitis symptoms for the last 1 year.  Having some anosmia on and off.  Used Zyrtec, prednisone, Flonase with no benefit.  ENT evaluation showed deviated septum. Interim history - 2020 bloodwork was positive to dust mites, cat, dog, grass pollen, tree pollen, ragweed pollen, weed pollen and borderline to cockroach. 2 dogs at home.   Continue environmental control measures - especially with regarding the pets. Recommend HEPA filter.    Continue Flonase 1 spray per nostril twice a day.  May use azelastine nasal spray 1-2 sprays per nostril 1-2 times a day as needed for drainage.  Continue Xyzal 5mg  daily at night.   Start saline irrigations 1-2 times a day and do it before you use your nasal sprays.

## 2019-07-19 NOTE — Progress Notes (Signed)
RE: Bailey Mcdaniel MRN: [id number] DOB: 09-Oct-1970 Date of Telemedicine Visit: 07/19/2019  Referring provider: Tamsen Roers, MD Primary care provider: Tamsen Roers, MD  Chief Complaint: Asthma   Telemedicine Follow Up Visit via Telephone: I connected with Bailey Mcdaniel for a follow up on 07/19/19 by telephone and verified that I am speaking with the correct person using two identifiers.   I discussed the limitations, risks, security and privacy concerns of performing an evaluation and management service by telephone and the availability of in person appointments. I also discussed with the patient that there may be a patient responsible charge related to this service. The patient expressed understanding and agreed to proceed.  Patient is at home/work. Provider is at the office.  Visit start time: 11:23AM Visit end time: 11:38AM Insurance consent/check in by: front desk Medical consent and medical assistant/nurse: Sheliah Plane.  History of Present Illness: She is a 48 y.o. female, who is being followed for asthma/COPD, allergic rhinoconjunctivitis. Her previous allergy office visit was on 06/21/2019 with Dr. Maudie Mercury. Today is a regular follow up visit.  Asthma-COPD overlap syndrome  Still having some shortness of breath and coughing but much better than the last visit.   Patient is using Advair 230 2 puffs BID and used albuterol 8-9 times since the last visit. Trying to decrease smoking.   Denies any ER/urgent care visits or prednisone use since the last visit.  Allergic rhinitis Using Flonase 1 spray BID and Xyzal 5mg  daily with good benefit. 2 dogs at home but they do not go into the bedroom.   Assessment and Plan: Bailey Mcdaniel is a 49 y.o. female with: Asthma-COPD overlap syndrome ([place]) Past history - Asthma as a child and is a heavy smoker and used to smoke up to 3 packs/day.  She also has history of reflux. 2020 spirometry showed moderate obstruction with 45% improvement in  FEV1 post bronchodilator treatment.  2020 CXR normal. Interim history - Doing much better with Advair and used albuterol 8-9 times since last visit in 1 month which is significantly better than before.   Daily controller medication(s):continue Advair 230 2 puffs twice a day with spacer and rinse mouth afterwards.  Prior to physical activity:May use albuterol rescue inhaler 2 puffs 5 to 15 minutes prior to strenuous physical activities.  Rescue medications:May use albuterol rescue inhaler 2 puffs or nebulizer every 4 to 6 hours as needed for shortness of breath, chest tightness, coughing, and wheezing. Monitor frequency of use  Get spirometry at next visit.  Tobacco user  Discussed smoking cessation and encouraged quitting.  Seasonal and perennial allergic rhinoconjunctivitis Past history - Perennial rhinoconjunctivitis symptoms for the last 1 year.  Having some anosmia on and off.  Used Zyrtec, prednisone, Flonase with no benefit.  ENT evaluation showed deviated septum. Interim history - 2020 bloodwork was positive to dust mites, cat, dog, grass pollen, tree pollen, ragweed pollen, weed pollen and borderline to cockroach. 2 dogs at home.   Continue environmental control measures - especially with regarding the pets. Recommend HEPA filter.    Continue Flonase 1 spray per nostril twice a day.  May use azelastine nasal spray 1-2 sprays per nostril 1-2 times a day as needed for drainage.  Continue Xyzal 5mg  daily at night.   Start saline irrigations 1-2 times a day and do it before you use your nasal sprays.  Return in about 4 weeks (around 08/16/2019).  Diagnostics: None.  Medication List:  Current Outpatient Medications  Medication Sig Dispense Refill  .  Adalimumab (HUMIRA) 40 MG/0.8ML PSKT Inject 80 mg into the skin once a week.     Marland Kitchen albuterol (VENTOLIN HFA) 108 (90 Base) MCG/ACT inhaler Inhale 2 puffs into the lungs every 4 (four) hours as needed for wheezing or shortness of  breath. 18 g 2  . atorvastatin (LIPITOR) 10 MG tablet Take 10 mg by mouth daily.    Marland Kitchen azelastine (ASTELIN) 0.1 % nasal spray Take 1-2 sprays per nostril twice a day as needed for drainage. 30 mL 5  . clonazePAM (KLONOPIN) 0.5 MG tablet Take 0.5 mg by mouth 2 (two) times daily as needed for anxiety.    . fluticasone (FLONASE) 50 MCG/ACT nasal spray     . fluticasone-salmeterol (ADVAIR HFA) 230-21 MCG/ACT inhaler Inhale 2 puffs into the lungs 2 (two) times daily. 1 Inhaler 5  . levocetirizine (XYZAL) 5 MG tablet Take 1 tablet (5 mg total) by mouth every evening. 30 tablet 5  . lubiprostone (AMITIZA) 8 MCG capsule Take 8 mcg by mouth 2 (two) times daily with a meal.    . naproxen (NAPROSYN) 500 MG tablet Take 1 tablet (500 mg total) by mouth 2 (two) times daily. 20 tablet 0  . rifampin (RIFADIN) 300 MG capsule Take 300 mg by mouth 2 (two) times daily.      No current facility-administered medications for this visit.   Allergies: No Known Allergies I reviewed her past medical history, social history, family history, and environmental history and no significant changes have been reported from her previous visit.  Review of Systems  Constitutional: Negative for appetite change, chills, fever and unexpected weight change.  HENT: Positive for congestion. Negative for rhinorrhea.   Eyes: Negative for itching.  Respiratory: Positive for cough and shortness of breath. Negative for chest tightness and wheezing.   Cardiovascular: Negative for chest pain.  Gastrointestinal: Negative for abdominal pain.  Genitourinary: Negative for difficulty urinating.  Skin: Negative for rash.  Allergic/Immunologic: Positive for environmental allergies. Negative for food allergies.  Neurological: Negative for headaches.   Objective: Physical Exam Not obtained as encounter was done via telephone.   Previous notes and tests were reviewed.  I discussed the assessment and treatment plan with the patient. The  patient was provided an opportunity to ask questions and all were answered. The patient agreed with the plan and demonstrated an understanding of the instructions. After visit summary/patient instructions available via mychart.   The patient was advised to call back or seek an in-person evaluation if the symptoms worsen or if the condition fails to improve as anticipated.  I provided 15 minutes of non-face-to-face time during this encounter.  It was my pleasure to participate in Bailey Mcdaniel's care today. Please feel free to contact me with any questions or concerns.   Sincerely,  Wyline Mood, DO Allergy & Immunology  [facility] office: [phone] [facility] office: [phone] [place] office: [phone]

## 2019-07-19 NOTE — Patient Instructions (Addendum)
Asthma-COPD overlap syndrome   Continue working on decreasing your smoking.   Daily controller medication(s):continue Advair 230 2 puffs twice a day with spacer and rinse mouth afterwards.  Prior to physical activity:May use albuterol rescue inhaler 2 puffs 5 to 15 minutes prior to strenuous physical activities.  Rescue medications:May use albuterol rescue inhaler 2 puffs or nebulizer every 4 to 6 hours as needed for shortness of breath, chest tightness, coughing, and wheezing. Monitor frequency of use Asthma control goals:  Full participation in all desired activities (may need albuterol before activity) Albuterol use two times or less a week on average (not counting use with activity) Cough interfering with sleep two times or less a month Oral steroids no more than once a year No hospitalizations  Environmental allergies 2020 testing was positive to dust mites, cat, dog, grass pollen, tree pollen, ragweed pollen, weed pollen and borderline to cockroach.  Continue environmental control measures - especially with regarding the pets. You may want to get a HEPA filter as well.   Continue Flonase 1 spray per nostril twice a day.  May use azelastine nasal spray 1-2 sprays per nostril 1-2 times a day as needed for drainage.  Continue xyzal 5mg  daily at night.   Start saline irrigations 1-2 times a day and do it before you use your nasal sprays.  Follow up in 4 weeks.  Control of House Dust Mite Allergen . Dust mite allergens are a common trigger of allergy and asthma symptoms. While they can be found throughout the house, these microscopic creatures thrive in warm, humid environments such as bedding, upholstered furniture and carpeting. . Because so much time is spent in the bedroom, it is essential to reduce mite levels there.  . Encase pillows, mattresses, and box springs in special allergen-proof fabric covers or airtight, zippered plastic covers.  . Bedding should be washed  weekly in hot water (130 F) and dried in a hot dryer. Allergen-proof covers are available for comforters and pillows that can't be regularly washed.  the allergy-proof covers every few months. Minimize clutter in the bedroom. Keep pets out of the bedroom.  Bailey Mcdaniel Keep humidity less than 50% by using a dehumidifier or air conditioning. You can buy a humidity measuring device called a hygrometer to monitor this.  . If possible, replace carpets with hardwood, linoleum, or washable area rugs. If that's not possible, vacuum frequently with a vacuum that has a HEPA filter. . Remove all upholstered furniture and non-washable window drapes from the bedroom. . Remove all non-washable stuffed toys from the bedroom.  Wash stuffed toys weekly. Pet Allergen Avoidance: . Contrary to popular opinion, there are no "hypoallergenic" breeds of dogs or cats. That is because people are not allergic to an animal's hair, but to an allergen found in the animal's saliva, dander (dead skin flakes) or urine. Pet allergy symptoms typically occur within minutes. For some people, symptoms can build up and become most severe 8 to 12 hours after contact with the animal. People with severe allergies can experience reactions in public places if dander has been transported on the pet owners' clothing. Marland Kitchen Keeping an animal outdoors is only a partial solution, since homes with pets in the yard still have higher concentrations of animal allergens. . Before getting a pet, ask your allergist to determine if you are allergic to animals. If your pet is already considered part of your family, try to minimize contact and keep the pet out of the bedroom and other  rooms where you spend a great deal of time. . As with dust mites, vacuum carpets often or replace carpet with a hardwood floor, tile or linoleum. . High-efficiency particulate air (HEPA) cleaners can reduce allergen levels over time. . While dander and saliva are the source of cat and  dog allergens, urine is the source of allergens from rabbits, hamsters, mice and [place] pigs; so ask a non-allergic family member to clean the animal's cage. . If you have a pet allergy, talk to your allergist about the potential for allergy immunotherapy (allergy shots). This strategy can often provide long-term relief. Reducing Pollen Exposure . Pollen seasons: trees (spring), grass (summer) and ragweed/weeds (fall). Marland Kitchen Keep windows closed in your home and car to lower pollen exposure.  Bailey Mcdaniel air conditioning in the bedroom and throughout the house if possible.  . Avoid going out in dry windy days - especially early morning. . Pollen counts are highest between 5 - 10 AM and on dry, hot and windy days.  . Save outside activities for late afternoon or after a heavy rain, when pollen levels are lower.  . Avoid mowing of grass if you have grass pollen allergy. Marland Kitchen Be aware that pollen can also be transported indoors on people and pets.  . Dry your clothes in an automatic dryer rather than hanging them outside where they might collect pollen.  . Rinse hair and eyes before bedtime. Cockroach Allergen Avoidance Cockroaches are often found in the homes of densely populated urban areas, schools or commercial buildings, but these creatures can lurk almost anywhere. This does not mean that you have a dirty house or living area. . Block all areas where roaches can enter the home. This includes crevices, wall cracks and windows.  . Cockroaches need water to survive, so fix and seal all leaky faucets and pipes. Have an exterminator go through the house when your family and pets are gone to eliminate any remaining roaches. Marland Kitchen Keep food in lidded containers and put pet food dishes away after your pets are done eating. Vacuum and sweep the floor after meals, and take out garbage and recyclables. Use lidded garbage containers in the kitchen. Wash dishes immediately after use and clean under stoves, refrigerators or  toasters where crumbs can accumulate. Wipe off the stove and other kitchen surfaces and cupboards regularly.

## 2019-07-19 NOTE — Assessment & Plan Note (Signed)
Past history - Asthma as a child and is a heavy smoker and used to smoke up to 3 packs/day.  She also has history of reflux. 2020 spirometry showed moderate obstruction with 45% improvement in FEV1 post bronchodilator treatment.  2020 CXR normal. Interim history - Doing much better with Advair and used albuterol 8-9 times since last visit in 1 month which is significantly better than before.   Daily controller medication(s):continue Advair 230 2 puffs twice a day with spacer and rinse mouth afterwards.  Prior to physical activity:May use albuterol rescue inhaler 2 puffs 5 to 15 minutes prior to strenuous physical activities.  Rescue medications:May use albuterol rescue inhaler 2 puffs or nebulizer every 4 to 6 hours as needed for shortness of breath, chest tightness, coughing, and wheezing. Monitor frequency of use  Get spirometry at next visit.

## 2019-07-24 NOTE — Unmapped (Signed)
The [facility] has made a third and final attempt to reach this patient to refill the following medication:Humira.      We have left voicemails on the following phone numbers: [phone].    Dates contacted: 12/10 12/25 12/21   Last scheduled delivery: 11/18    The patient may be at risk of non-compliance with this medication. The patient should call the [facility] at [phone] (option 4) to refill medication.    Jaclyn Carew D Administrator [facility] Specialty Technician

## 2019-08-16 ENCOUNTER — Encounter: Payer: Self-pay | Admitting: Allergy

## 2019-08-16 ENCOUNTER — Other Ambulatory Visit: Payer: Self-pay

## 2019-08-16 ENCOUNTER — Ambulatory Visit (INDEPENDENT_AMBULATORY_CARE_PROVIDER_SITE_OTHER): Payer: Medicare Other | Admitting: Allergy

## 2019-08-16 VITALS — BP 110/70 | HR 80 | Temp 97.8°F | Resp 18 | Ht 65.0 in | Wt 146.0 lb

## 2019-08-16 DIAGNOSIS — H1010 Acute atopic conjunctivitis, unspecified eye: Secondary | ICD-10-CM | POA: Diagnosis not present

## 2019-08-16 DIAGNOSIS — Z720 Tobacco use: Secondary | ICD-10-CM

## 2019-08-16 DIAGNOSIS — J302 Other seasonal allergic rhinitis: Secondary | ICD-10-CM | POA: Diagnosis not present

## 2019-08-16 DIAGNOSIS — J449 Chronic obstructive pulmonary disease, unspecified: Secondary | ICD-10-CM | POA: Diagnosis not present

## 2019-08-16 DIAGNOSIS — J3089 Other allergic rhinitis: Secondary | ICD-10-CM

## 2019-08-16 NOTE — Patient Instructions (Addendum)
Asthma-COPD overlap syndrome  Daily controller medication(s):continue Advair 230 2 puffs twice a day with spacer and rinse mouth afterwards.  Prior to physical activity:May use albuterol rescue inhaler 2 puffs 5 to 15 minutes prior to strenuous physical activities.  Rescue medications:May use albuterol rescue inhaler 2 puffs or nebulizer every 4 to 6 hours as needed for shortness of breath, chest tightness, coughing, and wheezing. Monitor frequency of use Asthma control goals:  Full participation in all desired activities (may need albuterol before activity) Albuterol use two times or less a week on average (not counting use with activity) Cough interfering with sleep two times or less a month Oral steroids no more than once a year No hospitalizations  Tobacco user  Decrease smoking.   Seasonal and perennial allergic rhinoconjunctivitis 2020 bloodwork was positive to dust mites, cat, dog, grass pollen, tree pollen, ragweed pollen, weed pollen and borderline to cockroach.   Continue environmental control measures - especially with regarding the pets.   Continue Xyzal 5mg  daily at night.   May use Flonase 1 spray per nostril twice a day for nasal congestion.   May use azelastine nasal spray 1-2 sprays per nostril 1-2 times a day as needed for drainage.  Nasal saline spray (i.e., Simply Saline) or nasal saline lavage (i.e., NeilMed) is recommended as needed and prior to medicated nasal sprays.  Follow up in 3 months or sooner if needed.

## 2019-08-16 NOTE — Assessment & Plan Note (Signed)
Smoking less but still smoking.   Discussed smoking cessation and encouraged quitting.

## 2019-08-16 NOTE — Progress Notes (Signed)
Follow Up Note  RE: [name] MRN: [id number] DOB: 1971/04/27 Date of Office Visit: 08/16/2019  Referring provider: [name], MD Primary care provider: [name], MD  Chief Complaint: Asthma (stopped one week to see how she will feel and restarted agian ) and Allergic Rhinitis  (Dogs and chickens)  History of Present Illness: I had the pleasure of seeing Bailey Mcdaniel for a follow up visit at the [facility] on 08/16/2019. She is a 49 y.o. female, who is being followed for asthma/COPD, tobacco user and allergic rhinoconjunctivitis. Her previous allergy office visit was on 07/19/2019 with Dr. Selena Batten via telemedicine. Today is a regular follow up visit.  Asthma-COPD overlap syndrome (HCC) Patient stopped Advair for about 1 week to see if she really needed the inhaler and had shortness of breath and wheezing.   Currently on Advair 230 2 puffs BID and doing well on it. Used albuterol about twice since the last visit when she was playing with her grandchildren. Otherwise denies ER/urgent care visits or prednisone use since the last visit.  Smoking 3-4 cigs/day.  ACT score 22 today.   Seasonal and perennial allergic rhinoconjunctivitis Improved symptoms with the medications. Currently on Xyzal daily, using nasal sprays only as needed about once a week. She also tried to stop the Xyzal but noticed worsening symptoms.   The dogs still roam around the home/bedroom.   Takes care of her 2 grandchildren who are 2 and 5 during the d ay.   Assessment and Plan: Bailey Mcdaniel is a 49 y.o. female with: Asthma-COPD overlap syndrome (HCC) Past history - Asthma as a child and is a heavy smoker and used to smoke up to 3 packs/day.  She also has history of reflux. 2020 spirometry showed moderate obstruction with 45% improvement in FEV1 post bronchodilator treatment.  2020 CXR normal. Interim history - Doing much better with Advair. Tried to stop Advair for 1 week and  noticed wheezing and shortness breath.  Today's spirometry was normal - much improved from previous one.  Act score 22.  Daily controller medication(s):continue Advair 230 2 puffs twice a day with spacer and rinse mouth afterwards.  Prior to physical activity:May use albuterol rescue inhaler 2 puffs 5 to 15 minutes prior to strenuous physical activities.  Rescue medications:May use albuterol rescue inhaler 2 puffs or nebulizer every 4 to 6 hours as needed for shortness of breath, chest tightness, coughing, and wheezing. Monitor frequency of use  Tobacco user Smoking less but still smoking.   Discussed smoking cessation and encouraged quitting.  Seasonal and perennial allergic rhinoconjunctivitis Past history - Perennial rhinoconjunctivitis symptoms for the last 1 year.  Having some anosmia on and off.  Used Zyrtec, prednisone, Flonase with no benefit.  ENT evaluation showed deviated septum. 2020 bloodwork was positive to dust mites, cat, dog, grass pollen, tree pollen, ragweed pollen, weed pollen and borderline to cockroach. 2 dogs at home.  Interim history - much better with below regimen. Only using nasal spray once a week. Tied to stop Xyzal but had worsening symptoms.   Continue environmental control measures - especially with regarding the pets.   Continue Xyzal 5mg  daily at night.   May use Flonase 1 spray per nostril twice a day for nasal congestion.   May use azelastine nasal spray 1-2 sprays per nostril 1-2 times a day as needed for drainage.  Nasal saline spray (i.e., Simply Saline) or nasal saline lavage (i.e., NeilMed) is recommended as needed and prior  to medicated nasal sprays.  Return in about 3 months (around 11/14/2019).  Diagnostics: Spirometry:  Tracings reviewed. Her effort: Good reproducible efforts. FVC: 3.26L FEV1: 2.49L, 84% predicted FEV1/FVC ratio: 76% Interpretation: Spirometry consistent with normal pattern.  Please see scanned spirometry results  for details.  Medication List:  Current Outpatient Medications  Medication Sig Dispense Refill  . Adalimumab (HUMIRA) 40 MG/0.8ML PSKT Inject 80 mg into the skin once a week.     Marland Kitchen albuterol (VENTOLIN HFA) 108 (90 Base) MCG/ACT inhaler Inhale 2 puffs into the lungs every 4 (four) hours as needed for wheezing or shortness of breath. 18 g 2  . atorvastatin (LIPITOR) 10 MG tablet Take 10 mg by mouth daily.    Marland Kitchen azelastine (ASTELIN) 0.1 % nasal spray Take 1-2 sprays per nostril twice a day as needed for drainage. 30 mL 5  . clonazePAM (KLONOPIN) 0.5 MG tablet Take 0.5 mg by mouth 2 (two) times daily as needed for anxiety.    . fluticasone (FLONASE) 50 MCG/ACT nasal spray     . fluticasone-salmeterol (ADVAIR HFA) 230-21 MCG/ACT inhaler Inhale 2 puffs into the lungs 2 (two) times daily. 1 Inhaler 5  . levocetirizine (XYZAL) 5 MG tablet Take 1 tablet (5 mg total) by mouth every evening. 30 tablet 5  . lubiprostone (AMITIZA) 8 MCG capsule Take 8 mcg by mouth 2 (two) times daily with a meal.    . naproxen (NAPROSYN) 500 MG tablet Take 1 tablet (500 mg total) by mouth 2 (two) times daily. 20 tablet 0  . rifampin (RIFADIN) 300 MG capsule Take 300 mg by mouth 2 (two) times daily.      No current facility-administered medications for this visit.   Allergies: No Known Allergies I reviewed her past medical history, social history, family history, and environmental history and no significant changes have been reported from her previous visit.  Review of Systems  Constitutional: Negative for appetite change, chills, fever and unexpected weight change.  HENT: Negative for congestion and rhinorrhea.   Eyes: Negative for itching.  Respiratory: Negative for cough, chest tightness, shortness of breath and wheezing.   Cardiovascular: Negative for chest pain.  Gastrointestinal: Negative for abdominal pain.  Genitourinary: Negative for difficulty urinating.  Skin: Negative for rash.  Allergic/Immunologic:  Positive for environmental allergies. Negative for food allergies.  Neurological: Negative for headaches.   Objective: BP 110/70   Pulse 80   Temp 97.8 F (36.6 C) (Temporal)   Resp 18   Ht 5\' 5"  (1.651 m)   Wt 146 lb (66.2 kg)   SpO2 98%   BMI 24.30 kg/m  Body mass index is 24.3 kg/m. Physical Exam  Constitutional: She is oriented to person, place, and time. She appears well-developed and well-nourished.  HENT:  Head: Normocephalic and atraumatic.  Right Ear: External ear normal.  Left Ear: External ear normal.  Nose: Nose normal.  Mouth/Throat: Oropharynx is clear and moist.  Eyes: Conjunctivae and EOM are normal.  Cardiovascular: Normal rate, regular rhythm and normal heart sounds. Exam reveals no gallop and no friction rub.  No murmur heard. Pulmonary/Chest: Effort normal. She has no wheezes. She has no rales.  Abdominal: Soft.  Musculoskeletal:     Cervical back: Neck supple.  Neurological: She is alert and oriented to person, place, and time.  Skin: Skin is warm. No rash noted.  Psychiatric: She has a normal mood and affect. Her behavior is normal.  Nursing note and vitals reviewed.  Previous notes and tests were reviewed.  The plan was reviewed with the patient/family, and all questions/concerned were addressed.  It was my pleasure to see Josephine today and participate in her care. Please feel free to contact me with any questions or concerns.  Sincerely,  Wyline Mood, DO Allergy & Immunology  [facility] office: [phone] [facility] office: [phone] [place] office: [phone]

## 2019-08-16 NOTE — Assessment & Plan Note (Signed)
Past history - Asthma as a child and is a heavy smoker and used to smoke up to 3 packs/day.  She also has history of reflux. 2020 spirometry showed moderate obstruction with 45% improvement in FEV1 post bronchodilator treatment.  2020 CXR normal. Interim history - Doing much better with Advair. Tried to stop Advair for 1 week and noticed wheezing and shortness breath.  Today's spirometry was normal - much improved from previous one.  Act score 22.  Daily controller medication(s):continue Advair 230 2 puffs twice a day with spacer and rinse mouth afterwards.  Prior to physical activity:May use albuterol rescue inhaler 2 puffs 5 to 15 minutes prior to strenuous physical activities.  Rescue medications:May use albuterol rescue inhaler 2 puffs or nebulizer every 4 to 6 hours as needed for shortness of breath, chest tightness, coughing, and wheezing. Monitor frequency of use

## 2019-08-16 NOTE — Assessment & Plan Note (Signed)
Past history - Perennial rhinoconjunctivitis symptoms for the last 1 year.  Having some anosmia on and off.  Used Zyrtec, prednisone, Flonase with no benefit.  ENT evaluation showed deviated septum. 2020 bloodwork was positive to dust mites, cat, dog, grass pollen, tree pollen, ragweed pollen, weed pollen and borderline to cockroach. 2 dogs at home.  Interim history - much better with below regimen. Only using nasal spray once a week. Tied to stop Xyzal but had worsening symptoms.   Continue environmental control measures - especially with regarding the pets.   Continue Xyzal 5mg  daily at night.   May use Flonase 1 spray per nostril twice a day for nasal congestion.   May use azelastine nasal spray 1-2 sprays per nostril 1-2 times a day as needed for drainage.  Nasal saline spray (i.e., Simply Saline) or nasal saline lavage (i.e., NeilMed) is recommended as needed and prior to medicated nasal sprays.

## 2019-10-18 NOTE — Unmapped (Signed)
This patient has been disenrolled from the [facility] specialty pharmacy services due to inability of the pharmacy to reach the patient. See Rebecca's note from 07/24/19 for outreach details. I also sent a MyChart message asking her to call the pharmacy if she'd like to resume Humira treatment.      [name]  [facility] Specialty Pharmacist

## 2019-11-08 NOTE — Unmapped (Signed)
[facility] Clinical Assessment & Refill Coordination Note    Krista Williamson, DOB: 04/12/1971  Phone: [phone] (home)     All above HIPAA information was verified with patient.     Was a Nurse, learning disability used for this call? No    Specialty Medication(s):   Inflammatory Disorders: Humira     Current Outpatient Medications   Medication Sig Dispense Refill   ??? acetaminophen (TYLENOL) 325 MG tablet Frequency:   Dosage:0.0     Instructions:  Note:     ??? atorvastatin (LIPITOR) 10 MG tablet Take 10 mg by mouth.     ??? buPROPion (WELLBUTRIN SR) 150 MG 12 hr tablet Take 150 mg by mouth.     ??? cephalexin (KEFLEX) 500 MG capsule Take 500 mg by mouth Two (2) times a day.     ??? clindamycin (CLEOCIN) 300 MG capsule Take 300 MG BID with food 60 capsule 3   ??? clonazePAM (KLONOPIN) 1 MG tablet Take 1 mg by mouth daily as needed for anxiety.     ??? HUMIRA PEN CITRATE FREE 40 MG/0.4 ML Inject the contents of 2 pens (80 mg total) under the skin once a week. 8 each 6   ??? HYDROcodone-acetaminophen (NORCO) 5-325 mg per tablet Take 1 tablet by mouth.     ??? ibuprofen (ADVIL,MOTRIN) 200 MG tablet Take 400 mg by mouth.     ??? lubiprostone (AMITIZA) 8 MCG capsule Take 8 mcg by mouth 2 (two) times a day with meals.     ??? naproxen sodium (ALEVE) 220 MG tablet Take 440-660 mg by mouth.     ??? ofloxacin (OCUFLOX) 0.3 % ophthalmic solution Apply 5 drops in left ear twice daily for 7 days     ??? rifAMPin (RIFADIN) 300 MG capsule Take 1 capsule (300 mg total) by mouth Two (2) times a day. 60 capsule 2   ??? traMADol (ULTRAM) 50 mg tablet Take 1 tablet (50 mg total) by mouth every six (6) hours as needed for pain. 10 tablet 0     No current facility-administered medications for this visit.         Changes to medications: [name] reports no changes at this time.    No Known Allergies    Changes to allergies: No    SPECIALTY MEDICATION ADHERENCE     Humira 40mg /0.66ml: 0 days of medicine on hand     Medication Adherence Patient reported X missed doses in the last month: all  Specialty Medication: Humira 40mg /0.36ml          Specialty medication(s) dose(s) confirmed: Regimen is correct and unchanged.     Are there any concerns with adherence? Yes: [name] has been dealing with taking care of her mother as she is facing some serious health issues.  During this time her focus has been so much on her mother that she hasn't been keeping up with her own medications and health.    Adherence counseling provided? Yes: We talked about the importance of keeping herself healthy so she can be there to support her family the best that she can.    CLINICAL MANAGEMENT AND INTERVENTION      Clinical Benefit Assessment:    Do you feel the medicine is effective or helping your condition? Yes    Clinical Benefit counseling provided? Not needed    Adverse Effects Assessment:    Are you experiencing any side effects? No    Are you experiencing difficulty administering your  medicine? No    Quality of Life Assessment:    How many days over the past month did your hidradenitis suppurativa  keep you from your normal activities? For example, brushing your teeth or getting up in the morning. Patient declined to answer    Have you discussed this with your provider? Not needed    Therapy Appropriateness:    Is therapy appropriate? Yes, therapy is appropriate and should be continued    DISEASE/MEDICATION-SPECIFIC INFORMATION      For patients on injectable medications: Patient currently has 0 doses left.  Next injection is scheduled for ASAP (past due).    PATIENT SPECIFIC NEEDS     ? Does the patient have any physical, cognitive, or cultural barriers? No    ? Is the patient high risk? No     ? Does the patient require a Care Management Plan? No     ? Does the patient require physician intervention or other additional services (i.e. nutrition, smoking cessation, social work)? No      SHIPPING     Specialty Medication(s) to be Shipped:   Inflammatory Disorders: Humira 40mg /0.101ml    Other medication(s) to be shipped: none       Changes to insurance: No    Delivery Scheduled: Yes, Expected medication delivery date: 11/10/2019.     Medication will be delivered via UPS to the confirmed prescription address in [place].    The patient will receive a drug information handout for each medication shipped and additional FDA Medication Guides as required.  Verified that patient has previously received a Conservation officer, historic buildings.    All of the patient's questions and concerns have been addressed.    [name]   [facility] Specialty Pharmacist

## 2019-11-09 MED FILL — HUMIRA PEN CITRATE FREE 40 MG/0.4 ML: SUBCUTANEOUS | 28 days supply | Qty: 8 | Fill #0

## 2019-11-09 MED FILL — HUMIRA PEN CITRATE FREE 40 MG/0.4 ML: 28 days supply | Qty: 8 | Fill #0 | Status: AC

## 2019-11-15 ENCOUNTER — Other Ambulatory Visit: Payer: Self-pay

## 2019-11-15 ENCOUNTER — Ambulatory Visit (INDEPENDENT_AMBULATORY_CARE_PROVIDER_SITE_OTHER): Payer: Medicare Other | Admitting: Allergy

## 2019-11-15 ENCOUNTER — Encounter: Payer: Self-pay | Admitting: Allergy

## 2019-11-15 VITALS — BP 118/68 | HR 72 | Temp 98.0°F | Resp 16

## 2019-11-15 DIAGNOSIS — J449 Chronic obstructive pulmonary disease, unspecified: Secondary | ICD-10-CM | POA: Diagnosis not present

## 2019-11-15 DIAGNOSIS — Z720 Tobacco use: Secondary | ICD-10-CM | POA: Diagnosis not present

## 2019-11-15 DIAGNOSIS — J302 Other seasonal allergic rhinitis: Secondary | ICD-10-CM

## 2019-11-15 DIAGNOSIS — J3089 Other allergic rhinitis: Secondary | ICD-10-CM

## 2019-11-15 DIAGNOSIS — H1010 Acute atopic conjunctivitis, unspecified eye: Secondary | ICD-10-CM | POA: Diagnosis not present

## 2019-11-15 MED ORDER — OLOPATADINE HCL 0.2 % OP SOLN: 1.0000 [drp] | Freq: Every day | OPHTHALMIC | 5 refills | Status: DC | PRN

## 2019-11-15 NOTE — Assessment & Plan Note (Signed)
Increased smoking due to personal stress.

## 2019-11-15 NOTE — Progress Notes (Signed)
Follow Up Note  RE: Bailey Mcdaniel MRN: [id number] DOB: 02-Sep-1970 Date of Office Visit: 11/15/2019  Referring provider: [name], MD Primary care provider: [name], MD  Chief Complaint: Asthma (doing a little bit better. still having some ear issues(stopped up feeling))  History of Present Illness: I had the pleasure of seeing Bailey Mcdaniel for a follow up visit at the [facility] on 11/15/2019. She is a 49 y.o. female, who is being followed for asthma and COPD, allergic rhinoconjunctivitis. Her previous allergy office visit was on 08/16/2019 with Dr. Selena Batten. Today is a regular follow up visit.  Asthma-COPD overlap syndrome (HCC) Currently on Advair 230 2 puff as needed and using it about 18 days out of a month.  Her breathing is usually triggered by exertion. Using albuterol every other day for shortness of breath.  Denies any ER/urgent care visits or prednisone use since the last visit.  Tobacco user Patient was smoking less but due to increased stress due to mother's recent cancer diagnosis she has been smoking more.   Seasonal and perennial allergic rhinoconjunctivitis Having some ear fullness/pain at time. Some rhinorrhea.  Currently on Xyzal 5mg  daily, azelastine 1 spray per nostril once a day.  Using Flonase only as needed now.   Assessment and Plan: Bailey Mcdaniel is a 49 y.o. female with: Asthma-COPD overlap syndrome (HCC) Past history - Asthma as a child and is a heavy smoker and used to smoke up to 3 packs/day.  She also has history of reflux. 2020 spirometry showed moderate obstruction with 45% improvement in FEV1 post bronchodilator treatment.  2020 CXR normal. Interim history - not using Advair daily but 18 days out of a month. Still using albuterol every 1-2 days.   Today's spirometry was not of ideal effort. No ober abnormalities noted given effort.  Act score 18.  Daily controller medication(s): START taking everyday - Advair 230 2  puffs twice a day with spacer and rinse mouth afterwards.  Prior to physical activity:May use albuterol rescue inhaler 2 puffs 5 to 15 minutes prior to strenuous physical activities.  Rescue medications:May use albuterol rescue inhaler 2 puffs or nebulizer every 4 to 6 hours as needed for shortness of breath, chest tightness, coughing, and wheezing. Monitor frequency of use.  Repeat spirometry at next visit.   Seasonal and perennial allergic rhinoconjunctivitis Past history - Perennial rhinoconjunctivitis symptoms for the last 1 year.  Having some anosmia on and off.  Used Zyrtec, prednisone, Flonase with no benefit.  ENT evaluation showed deviated septum.  2020 bloodwork was positive to dust mites, cat, dog, grass pollen, tree pollen, ragweed pollen, weed pollen and borderline to cockroach. 2 dogs at home. Interim history - still having some symptoms.   Continue environmental control measures - especially with regarding the pets.   ContinueXyzal5mg  daily at night.   May use Flonase 1 spray per nostril twice a day for nasal congestion.   May use azelastine nasal spray 1-2 sprays per nostril 1-2 times a day as needed for drainage.  Nasal saline spray (i.e., Simply Saline) or nasal saline lavage (i.e., NeilMed) is recommended as needed and prior to medicated nasal sprays.  May use olopatadine eye drops 0.2% once a day as needed for itchy/watery eyes.  Tobacco user Increased smoking due to personal stress.   Return in about 4 months (around 03/16/2020).  Meds ordered this encounter  Medications  . Olopatadine HCl 0.2 % SOLN    Sig: Apply 1 drop to  eye daily as needed (itchy/watery eyes).    Dispense:  2.5 mL    Refill:  5   Diagnostics: Spirometry:  Tracings reviewed. Her effort: It was hard to get consistent efforts and there is a question as to whether this reflects a maximal maneuver. FVC: 3.29L FEV1: 2.34L, 79% predicted FEV1/FVC ratio: 71% Interpretation: No overt  abnormalities noted given today's efforts.  Please see scanned spirometry results for details.  Medication List:  Current Outpatient Medications  Medication Sig Dispense Refill  . Adalimumab (HUMIRA) 40 MG/0.8ML PSKT Inject 80 mg into the skin once a week.     Marland Kitchen albuterol (VENTOLIN HFA) 108 (90 Base) MCG/ACT inhaler Inhale 2 puffs into the lungs every 4 (four) hours as needed for wheezing or shortness of breath. 18 g 2  . atorvastatin (LIPITOR) 10 MG tablet Take 10 mg by mouth daily.    Marland Kitchen azelastine (ASTELIN) 0.1 % nasal spray Take 1-2 sprays per nostril twice a day as needed for drainage. 30 mL 5  . clonazePAM (KLONOPIN) 0.5 MG tablet Take 0.5 mg by mouth 2 (two) times daily as needed for anxiety.    . fluticasone (FLONASE) 50 MCG/ACT nasal spray     . fluticasone-salmeterol (ADVAIR HFA) 230-21 MCG/ACT inhaler Inhale 2 puffs into the lungs 2 (two) times daily. 1 Inhaler 5  . levocetirizine (XYZAL) 5 MG tablet Take 1 tablet (5 mg total) by mouth every evening. 30 tablet 5  . lubiprostone (AMITIZA) 8 MCG capsule Take 8 mcg by mouth 2 (two) times daily with a meal.    . naproxen (NAPROSYN) 500 MG tablet Take 1 tablet (500 mg total) by mouth 2 (two) times daily. 20 tablet 0  . rifampin (RIFADIN) 300 MG capsule Take 300 mg by mouth 2 (two) times daily.     . Olopatadine HCl 0.2 % SOLN Apply 1 drop to eye daily as needed (itchy/watery eyes). 2.5 mL 5   No current facility-administered medications for this visit.   Allergies: No Known Allergies I reviewed her past medical history, social history, family history, and environmental history and no significant changes have been reported from her previous visit.  Review of Systems  Constitutional: Negative for appetite change, chills, fever and unexpected weight change.  HENT: Positive for congestion and rhinorrhea.   Eyes: Positive for itching.  Respiratory: Positive for cough. Negative for chest tightness, shortness of breath and wheezing.     Cardiovascular: Negative for chest pain.  Gastrointestinal: Negative for abdominal pain.  Genitourinary: Negative for difficulty urinating.  Skin: Negative for rash.  Allergic/Immunologic: Positive for environmental allergies. Negative for food allergies.  Neurological: Negative for headaches.   Objective: BP 118/68 (BP Location: Left Arm, Patient Position: Sitting, Cuff Size: Normal)   Pulse 72   Temp 98 F (36.7 C) (Temporal)   Resp 16   SpO2 98%  There is no height or weight on file to calculate BMI. Physical Exam  Constitutional: She is oriented to person, place, and time. She appears well-developed and well-nourished.  HENT:  Head: Normocephalic and atraumatic.  Right Ear: External ear normal.  Left Ear: External ear normal.  Nose: Nose normal.  Mouth/Throat: Oropharynx is clear and moist.  Eyes: Conjunctivae and EOM are normal.  Cardiovascular: Normal rate, regular rhythm and normal heart sounds. Exam reveals no gallop and no friction rub.  No murmur heard. Pulmonary/Chest: Effort normal. She has no wheezes. She has no rales.  Abdominal: Soft.  Musculoskeletal:     Cervical back: Neck supple.  Neurological: She is alert and oriented to person, place, and time.  Skin: Skin is warm. No rash noted.  Psychiatric: She has a normal mood and affect. Her behavior is normal.  Nursing note and vitals reviewed.  Previous notes and tests were reviewed. The plan was reviewed with the patient/family, and all questions/concerned were addressed.  It was my pleasure to see Bailey Mcdaniel today and participate in her care. Please feel free to contact me with any questions or concerns.  Sincerely,  Wyline Mood, DO Allergy & Immunology  [facility] office: [phone] [facility] office: [phone] [place] office: [phone]

## 2019-11-15 NOTE — Patient Instructions (Addendum)
Asthma:  Daily controller medication(s): START taking everyday - Advair 230 2 puffs twice a day with spacer and rinse mouth afterwards.  Prior to physical activity:May use albuterol rescue inhaler 2 puffs 5 to 15 minutes prior to strenuous physical activities.  Rescue medications:May use albuterol rescue inhaler 2 puffs or nebulizer every 4 to 6 hours as needed for shortness of breath, chest tightness, coughing, and wheezing. Monitor frequency of use Asthma control goals:  Full participation in all desired activities (may need albuterol before activity) Albuterol use two times or less a week on average (not counting use with activity) Cough interfering with sleep two times or less a month Oral steroids no more than once a year No hospitalizations  Seasonal and perennial allergic rhinoconjunctivitis  2020 bloodwork was positive to dust mites, cat, dog, grass pollen, tree pollen, ragweed pollen, weed pollen and borderline to cockroach.   Continue environmental control measures - especially with regarding the pets.   ContinueXyzal5mg  daily at night.   May use Flonase 1 spray per nostril twice a day for nasal congestion.   May use azelastine nasal spray 1-2 sprays per nostril 1-2 times a day as needed for drainage.  Nasal saline spray (i.e., Simply Saline) or nasal saline lavage (i.e., NeilMed) is recommended as needed and prior to medicated nasal sprays.  May use olopatadine eye drops 0.2% once a day as needed for itchy/watery eyes.  follow up in 4 months or sooner if needed.  Control of House Dust Mite Allergen . Dust mite allergens are a common trigger of allergy and asthma symptoms. While they can be found throughout the house, these microscopic creatures thrive in warm, humid environments such as bedding, upholstered furniture and carpeting. . Because so much time is spent in the bedroom, it is essential to reduce mite levels there.  . Encase pillows, mattresses, and box  springs in special allergen-proof fabric covers or airtight, zippered plastic covers.  . Bedding should be washed weekly in hot water (130 F) and dried in a hot dryer. Allergen-proof covers are available for comforters and pillows that can't be regularly washed.  Reyes Ivan the allergy-proof covers every few months. Minimize clutter in the bedroom. Keep pets out of the bedroom.  Marland Kitchen Keep humidity less than 50% by using a dehumidifier or air conditioning. You can buy a humidity measuring device called a hygrometer to monitor this.  . If possible, replace carpets with hardwood, linoleum, or washable area rugs. If that's not possible, vacuum frequently with a vacuum that has a HEPA filter. . Remove all upholstered furniture and non-washable window drapes from the bedroom. . Remove all non-washable stuffed toys from the bedroom.  Wash stuffed toys weekly. Pet Allergen Avoidance: . Contrary to popular opinion, there are no "hypoallergenic" breeds of dogs or cats. That is because people are not allergic to an animal's hair, but to an allergen found in the animal's saliva, dander (dead skin flakes) or urine. Pet allergy symptoms typically occur within minutes. For some people, symptoms can build up and become most severe 8 to 12 hours after contact with the animal. People with severe allergies can experience reactions in public places if dander has been transported on the pet owners' clothing. Marland Kitchen Keeping an animal outdoors is only a partial solution, since homes with pets in the yard still have higher concentrations of animal allergens. . Before getting a pet, ask your allergist to determine if you are allergic to animals. If your pet is already considered part of  your family, try to minimize contact and keep the pet out of the bedroom and other rooms where you spend a great deal of time. . As with dust mites, vacuum carpets often or replace carpet with a hardwood floor, tile or linoleum. . High-efficiency  particulate air (HEPA) cleaners can reduce allergen levels over time. . While dander and saliva are the source of cat and dog allergens, urine is the source of allergens from rabbits, hamsters, mice and [place] pigs; so ask a non-allergic family member to clean the animal's cage. . If you have a pet allergy, talk to your allergist about the potential for allergy immunotherapy (allergy shots). This strategy can often provide long-term relief. Reducing Pollen Exposure . Pollen seasons: trees (spring), grass (summer) and ragweed/weeds (fall). Marland Kitchen Keep windows closed in your home and car to lower pollen exposure.  Susa Simmonds air conditioning in the bedroom and throughout the house if possible.  . Avoid going out in dry windy days - especially early morning. . Pollen counts are highest between 5 - 10 AM and on dry, hot and windy days.  . Save outside activities for late afternoon or after a heavy rain, when pollen levels are lower.  . Avoid mowing of grass if you have grass pollen allergy. Marland Kitchen Be aware that pollen can also be transported indoors on people and pets.  . Dry your clothes in an automatic dryer rather than hanging them outside where they might collect pollen.  . Rinse hair and eyes before bedtime.

## 2019-11-15 NOTE — Assessment & Plan Note (Signed)
Past history - Perennial rhinoconjunctivitis symptoms for the last 1 year.  Having some anosmia on and off.  Used Zyrtec, prednisone, Flonase with no benefit.  ENT evaluation showed deviated septum.  2020 bloodwork was positive to dust mites, cat, dog, grass pollen, tree pollen, ragweed pollen, weed pollen and borderline to cockroach. 2 dogs at home. Interim history - still having some symptoms.   Continue environmental control measures - especially with regarding the pets.   ContinueXyzal5mg  daily at night.   May use Flonase 1 spray per nostril twice a day for nasal congestion.   May use azelastine nasal spray 1-2 sprays per nostril 1-2 times a day as needed for drainage.  Nasal saline spray (i.e., Simply Saline) or nasal saline lavage (i.e., NeilMed) is recommended as needed and prior to medicated nasal sprays.  May use olopatadine eye drops 0.2% once a day as needed for itchy/watery eyes.

## 2019-11-15 NOTE — Assessment & Plan Note (Signed)
Past history - Asthma as a child and is a heavy smoker and used to smoke up to 3 packs/day.  She also has history of reflux. 2020 spirometry showed moderate obstruction with 45% improvement in FEV1 post bronchodilator treatment.  2020 CXR normal. Interim history - not using Advair daily but 18 days out of a month. Still using albuterol every 1-2 days.   Today's spirometry was not of ideal effort. No ober abnormalities noted given effort.  Act score 18.  Daily controller medication(s): START taking everyday - Advair 230 2 puffs twice a day with spacer and rinse mouth afterwards.  Prior to physical activity:May use albuterol rescue inhaler 2 puffs 5 to 15 minutes prior to strenuous physical activities.  Rescue medications:May use albuterol rescue inhaler 2 puffs or nebulizer every 4 to 6 hours as needed for shortness of breath, chest tightness, coughing, and wheezing. Monitor frequency of use.  Repeat spirometry at next visit.

## 2019-12-05 NOTE — Unmapped (Signed)
[facility] Refill Coordination Note    Specialty Medication(s) to be Shipped:   Inflammatory Disorders: Humira    Other medication(s) to be shipped:       Krista Williamson, DOB: 1971-07-13  Phone: [phone] (home)       All above HIPAA information was verified with patient.     Was a Nurse, learning disability used for this call? No    Completed refill call assessment today to schedule patient's medication shipment from the [facility] [phone]).       Specialty medication(s) and dose(s) confirmed: Regimen is correct and unchanged.   Changes to medications: [name] reports no changes at this time.  Changes to insurance: No  Questions for the pharmacist: No    Confirmed patient received Welcome Packet with first shipment. The patient will receive a drug information handout for each medication shipped and additional FDA Medication Guides as required.       DISEASE/MEDICATION-SPECIFIC INFORMATION        For patients on injectable medications: Patient currently has 1 doses left.  Next injection is scheduled for 05/05.    SPECIALTY MEDICATION ADHERENCE     Medication Adherence    Patient reported X missed doses in the last month: 0  Specialty Medication: Humira CF 40 mg/0.4 ml  Patient is on additional specialty medications: No  Informant: patient  Reliability of informant: reliable                Humira 40/0.4 mg: 7 days of medicine on hand         SHIPPING     Shipping address confirmed in Epic.     Delivery Scheduled: Yes, Expected medication delivery date: 05/06.     Medication will be delivered via UPS to the prescription address in Epic WAM.    Antonietta Barcelona   [facility] Specialty Technician

## 2019-12-06 MED FILL — HUMIRA PEN CITRATE FREE 40 MG/0.4 ML: 28 days supply | Qty: 8 | Fill #1 | Status: AC

## 2019-12-06 MED FILL — HUMIRA PEN CITRATE FREE 40 MG/0.4 ML: SUBCUTANEOUS | 28 days supply | Qty: 8 | Fill #1

## 2020-01-08 NOTE — Unmapped (Signed)
The [facility] has made a third and final attempt to reach this patient to refill the following medication:Humira.      We have left voicemails on the following phone numbers: [phone] and sent a MyChart message.    Dates contacted: 5/26 6/2 6/7  Last scheduled delivery: 5/6    The patient may be at risk of non-compliance with this medication. The patient should call the [facility] at [phone] (option 4) to refill medication.    Lila Lufkin D Administrator [facility] Specialty Technician

## 2020-03-18 ENCOUNTER — Ambulatory Visit: Payer: Medicare Other | Admitting: Allergy

## 2020-03-18 NOTE — Progress Notes (Deleted)
Follow Up Note  RE: Bailey Mcdaniel MRN: [id number] DOB: 03/19/1971 Date of Office Visit: 03/18/2020  Referring provider: [name], MD Primary care provider: [name], MD  Chief Complaint: No chief complaint on file.  History of Present Illness: I had the pleasure of seeing Bailey Mcdaniel for a follow up visit at the [facility] on 03/18/2020. Bailey Mcdaniel is a 49 y.o. female, who is being followed for asthma/COPD, allergic rhinoconjunctivitis. Her previous allergy office visit was on 11/15/2019 with Dr. Selena Batten. Today is a regular follow up visit.  Asthma-COPD overlap syndrome (HCC) Past history - Asthma as a child and is a heavy smoker and used to smoke up to 3 packs/day.  Bailey Mcdaniel also has history of reflux. 2020 spirometry showed moderate obstruction with 45% improvement in FEV1 post bronchodilator treatment.  2020 CXR normal. Interim history - not using Advair daily but 18 days out of a month. Still using albuterol every 1-2 days.   Today's spirometry was not of ideal effort. No ober abnormalities noted given effort.  Act score 18.  Daily controller medication(s): START taking everyday - Advair 230 2 puffs twice a day with spacer and rinse mouth afterwards.  Prior to physical activity:May use albuterol rescue inhaler 2 puffs 5 to 15 minutes prior to strenuous physical activities.  Rescue medications:May use albuterol rescue inhaler 2 puffs or nebulizer every 4 to 6 hours as needed for shortness of breath, chest tightness, coughing, and wheezing. Monitor frequency of use.  Repeat spirometry at next visit.   Seasonal and perennial allergic rhinoconjunctivitis Past history - Perennial rhinoconjunctivitis symptoms for the last 1 year.  Having some anosmia on and off.  Used Zyrtec, prednisone, Flonase with no benefit.  ENT evaluation showed deviated septum.  2020 bloodwork was positive to dust mites, cat, dog, grass pollen, tree pollen, ragweed pollen, weed pollen  and borderline to cockroach. 2 dogs at home. Interim history - still having some symptoms.   Continue environmental control measures - especially with regarding the pets.   ContinueXyzal5mg  daily at night.   May use Flonase 1 spray per nostril twice a day for nasal congestion.   May use azelastine nasal spray 1-2 sprays per nostril 1-2 times a day as needed for drainage.  Nasal saline spray (i.e., Simply Saline) or nasal saline lavage (i.e., NeilMed) is recommended as needed and prior to medicated nasal sprays.  May use olopatadine eye drops 0.2% once a day as needed for itchy/watery eyes.  Tobacco user Increased smoking due to personal stress.   Assessment and Plan: Bailey Mcdaniel is a 49 y.o. female with: No problem-specific Assessment & Plan notes found for this encounter.  No follow-ups on file.  No orders of the defined types were placed in this encounter.  Lab Orders  No laboratory test(s) ordered today    Diagnostics: Spirometry:  Tracings reviewed. Her effort: {Blank single:19197::"Good reproducible efforts.","It was hard to get consistent efforts and there is a question as to whether this reflects a maximal maneuver.","Poor effort, data can not be interpreted."} FVC: ***L FEV1: ***L, ***% predicted FEV1/FVC ratio: ***% Interpretation: {Blank single:19197::"Spirometry consistent with mild obstructive disease","Spirometry consistent with moderate obstructive disease","Spirometry consistent with severe obstructive disease","Spirometry consistent with possible restrictive disease","Spirometry consistent with mixed obstructive and restrictive disease","Spirometry uninterpretable due to technique","Spirometry consistent with normal pattern","No overt abnormalities noted given today's efforts"}.  Please see scanned spirometry results for details.  Skin Testing: {Blank single:19197::"Select foods","Environmental allergy panel","Environmental allergy panel and select foods","Food  allergy panel","None","Deferred  due to recent antihistamines use"}. Positive test to: ***. Negative test to: ***.  Results discussed with patient/family.   Medication List:  Current Outpatient Medications  Medication Sig Dispense Refill  . Adalimumab (HUMIRA) 40 MG/0.8ML PSKT Inject 80 mg into the skin once a week.     Marland Kitchen albuterol (VENTOLIN HFA) 108 (90 Base) MCG/ACT inhaler Inhale 2 puffs into the lungs every 4 (four) hours as needed for wheezing or shortness of breath. 18 g 2  . atorvastatin (LIPITOR) 10 MG tablet Take 10 mg by mouth daily.    Marland Kitchen azelastine (ASTELIN) 0.1 % nasal spray Take 1-2 sprays per nostril twice a day as needed for drainage. 30 mL 5  . clonazePAM (KLONOPIN) 0.5 MG tablet Take 0.5 mg by mouth 2 (two) times daily as needed for anxiety.    . fluticasone (FLONASE) 50 MCG/ACT nasal spray     . fluticasone-salmeterol (ADVAIR HFA) 230-21 MCG/ACT inhaler Inhale 2 puffs into the lungs 2 (two) times daily. 1 Inhaler 5  . levocetirizine (XYZAL) 5 MG tablet Take 1 tablet (5 mg total) by mouth every evening. 30 tablet 5  . lubiprostone (AMITIZA) 8 MCG capsule Take 8 mcg by mouth 2 (two) times daily with a meal.    . naproxen (NAPROSYN) 500 MG tablet Take 1 tablet (500 mg total) by mouth 2 (two) times daily. 20 tablet 0  . Olopatadine HCl 0.2 % SOLN Apply 1 drop to eye daily as needed (itchy/watery eyes). 2.5 mL 5  . rifampin (RIFADIN) 300 MG capsule Take 300 mg by mouth 2 (two) times daily.      No current facility-administered medications for this visit.   Allergies: No Known Allergies I reviewed her past medical history, social history, family history, and environmental history and no significant changes have been reported from her previous visit.  Review of Systems  Constitutional: Negative for appetite change, chills, fever and unexpected weight change.  HENT: Positive for congestion and rhinorrhea.   Eyes: Positive for itching.  Respiratory: Positive for cough. Negative  for chest tightness, shortness of breath and wheezing.   Cardiovascular: Negative for chest pain.  Gastrointestinal: Negative for abdominal pain.  Genitourinary: Negative for difficulty urinating.  Skin: Negative for rash.  Allergic/Immunologic: Positive for environmental allergies. Negative for food allergies.  Neurological: Negative for headaches.   Objective: There were no vitals taken for this visit. There is no height or weight on file to calculate BMI. Physical Exam Vitals and nursing note reviewed.  Constitutional:      Appearance: Bailey Mcdaniel is well-developed.  HENT:     Head: Normocephalic and atraumatic.     Right Ear: External ear normal.     Left Ear: External ear normal.     Nose: Nose normal.     Mouth/Throat:     Mouth: Mucous membranes are moist.     Pharynx: Oropharynx is clear.  Eyes:     Conjunctiva/sclera: Conjunctivae normal.  Cardiovascular:     Rate and Rhythm: Normal rate and regular rhythm.     Heart sounds: Normal heart sounds. No murmur heard.  No friction rub. No gallop.   Pulmonary:     Effort: Pulmonary effort is normal.     Breath sounds: Normal breath sounds. No wheezing, rhonchi or rales.  Musculoskeletal:     Cervical back: Neck supple.  Skin:    General: Skin is warm.     Findings: No rash.  Neurological:     Mental Status: Bailey Mcdaniel is alert and oriented  to person, place, and time.  Psychiatric:        Behavior: Behavior normal.    Previous notes and tests were reviewed. The plan was reviewed with the patient/family, and all questions/concerned were addressed.  It was my pleasure to see Bailey Mcdaniel today and participate in her care. Please feel free to contact me with any questions or concerns.  Sincerely,  Wyline Mood, DO Allergy & Immunology  [facility] office: [phone] [facility] office: [phone] [place] office: [phone]

## 2020-04-11 ENCOUNTER — Other Ambulatory Visit: Payer: Self-pay

## 2020-04-11 ENCOUNTER — Ambulatory Visit: Payer: Medicare Other | Admitting: Cardiology

## 2020-04-11 ENCOUNTER — Other Ambulatory Visit: Payer: Self-pay | Admitting: Orthopedic Surgery

## 2020-04-11 ENCOUNTER — Encounter: Payer: Self-pay | Admitting: Cardiology

## 2020-04-11 VITALS — BP 121/74 | HR 93 | Resp 17 | Ht 65.0 in | Wt 143.0 lb

## 2020-04-11 DIAGNOSIS — Z8249 Family history of ischemic heart disease and other diseases of the circulatory system: Secondary | ICD-10-CM | POA: Insufficient documentation

## 2020-04-11 DIAGNOSIS — F17200 Nicotine dependence, unspecified, uncomplicated: Secondary | ICD-10-CM

## 2020-04-11 DIAGNOSIS — I209 Angina pectoris, unspecified: Secondary | ICD-10-CM | POA: Insufficient documentation

## 2020-04-11 MED ORDER — ASPIRIN EC 81 MG PO TBEC: 81.0000 mg | DELAYED_RELEASE_TABLET | Freq: Every day | ORAL | 3 refills | Status: DC

## 2020-04-11 MED ORDER — NITROGLYCERIN 0.4 MG SL SUBL: 0.4000 mg | SUBLINGUAL_TABLET | SUBLINGUAL | 3 refills | Status: DC | PRN

## 2020-04-11 MED ORDER — ATORVASTATIN CALCIUM 20 MG PO TABS: 10.0000 mg | ORAL_TABLET | Freq: Every day | ORAL | 2 refills | Status: DC

## 2020-04-11 MED ORDER — METOPROLOL TARTRATE 25 MG PO TABS: 25.0000 mg | ORAL_TABLET | Freq: Two times a day (BID) | ORAL | 3 refills | Status: DC

## 2020-04-11 NOTE — Patient Instructions (Signed)
Your cardiac CT will be scheduled at the locations below:   [facility]  [address]  [place], [place] [address]  [phone]    If scheduled at [facility], please arrive at the [facility] main entrance of [facility] 30-45 minutes prior to test start time.  Proceed to the [facility] Radiology Department (first floor) to check-in and test prep.   Please follow these instructions carefully (unless otherwise directed):    On the Night Before the Test:   Be sure to Drink plenty of water.   Do not consume any caffeinated/decaffeinated beverages or chocolate 12 hours prior to your test.   Do not take any antihistamines 12 hours prior to your test.   If the patient has contrast allergy:  Patient will need a prescription for Prednisone and very clear instructions (as follows):  1. Prednisone 50 mg - take 13 hours prior to test  2. Take another Prednisone 50 mg 7 hours prior to test  3. Take another Prednisone 50 mg 1 hour prior to test  4. Take Benadryl 50 mg 1 hour prior to test   Patient must complete all four doses of above prophylactic medications.   Patient will need a ride after test due to Benadryl.   On the Day of the Test:   Drink plenty of water. Do not drink any water within one hour of the test.   Do not eat any food 4 hours prior to the test.   You may take your regular medications prior to the test.    - Metoprolol tartarate Take 2 pills of 25 mg 2 hour before CT scan   FEMALES- please wear underwire-free bra if available          After the Test:   Drink plenty of water.   After receiving IV contrast, you may experience a mild flushed feeling. This is normal.   On occasion, you may experience a mild rash up to 24 hours after the test. This is not dangerous. If this occurs, you can take Benadryl 25 mg and increase your fluid intake.   If you experience trouble breathing, this can be serious. If it is severe call 911  IMMEDIATELY. If it is mild, please call our office.   If you take any of these medications: Glipizide/Metformin, Avandament, Glucavance, please do not take 48 hours after completing test unless otherwise instructed.     Please contact the cardiac imaging nurse navigator should you have any questions/concerns  Rockwell Alexandria, RN Navigator Cardiac Imaging  [facility] V[facility]  [phone] Office  [phone] Cell

## 2020-04-11 NOTE — Progress Notes (Signed)
Follow up visit  Subjective:   Bailey Mcdaniel, female    DOB: 03/14/1971, 49 y.o.   MRN: [id number]   HPI  Chief Complaint  Patient presents with  . Chest Pain  . Follow-up    49 y.o. Caucasian female with Crohn's disease, tobacco dependence, family h/o early CAD, atypical angina   Has been under a lot of stress recently, caring for her mother with pancreatic cancer.  Unfortunately, her mother passed away recently.  During this period, she had increased her smoking.  She did notice episodes of retrosternal chest pain, sometimes at rest, but at other times with activity such as mowing her lawn.  She is now making a conscious effort to cut back on her smoking.  In addition, patient had a mechanical fall yesterday while walking her dogs.  Her dogs chased her chicken, and patient was airborne for few feet before falling on her left wrist.  She is going to undergo a surgery for her left wrist next week.  She also has some rib fractures which are currently causing pain.   Current Outpatient Medications on File Prior to Visit  Medication Sig Dispense Refill  . Adalimumab (HUMIRA) 40 MG/0.8ML PSKT Inject 80 mg into the skin once a week.     . clonazePAM (KLONOPIN) 0.5 MG tablet Take 0.5 mg by mouth 2 (two) times daily as needed for anxiety.    Marland Kitchen albuterol (VENTOLIN HFA) 108 (90 Base) MCG/ACT inhaler Inhale 2 puffs into the lungs every 4 (four) hours as needed for wheezing or shortness of breath. (Patient not taking: Reported on 04/11/2020) 18 g 2  . atorvastatin (LIPITOR) 10 MG tablet Take 10 mg by mouth daily. (Patient not taking: Reported on 04/11/2020)    . azelastine (ASTELIN) 0.1 % nasal spray Take 1-2 sprays per nostril twice a day as needed for drainage. (Patient not taking: Reported on 04/11/2020) 30 mL 5  . escitalopram (LEXAPRO) 10 MG tablet Take 10 mg by mouth daily.    . fluticasone (FLONASE) 50 MCG/ACT nasal spray  (Patient not taking: Reported on 04/11/2020)    .  fluticasone-salmeterol (ADVAIR HFA) 230-21 MCG/ACT inhaler Inhale 2 puffs into the lungs 2 (two) times daily. (Patient not taking: Reported on 04/11/2020) 1 Inhaler 5  . levocetirizine (XYZAL) 5 MG tablet Take 1 tablet (5 mg total) by mouth every evening. (Patient not taking: Reported on 04/11/2020) 30 tablet 5  . lubiprostone (AMITIZA) 8 MCG capsule Take 8 mcg by mouth 2 (two) times daily with a meal. (Patient not taking: Reported on 04/11/2020)    . naproxen (NAPROSYN) 500 MG tablet Take 1 tablet (500 mg total) by mouth 2 (two) times daily. (Patient not taking: Reported on 04/11/2020) 20 tablet 0  . Olopatadine HCl 0.2 % SOLN Apply 1 drop to eye daily as needed (itchy/watery eyes). (Patient not taking: Reported on 04/11/2020) 2.5 mL 5  . rifampin (RIFADIN) 300 MG capsule Take 300 mg by mouth 2 (two) times daily.  (Patient not taking: Reported on 04/11/2020)     No current facility-administered medications on file prior to visit.    Cardiovascular & other pertient studies:  EKG 04/11/2020: Sinus rhythm 86 bpm  Nonspecific ST-T changes   Treadmill stress test 2016: 10/1 METS, Normal study.   Recent labs: 02/21/2020: Glucose 87, BUN/Cr 13/0.66. EGFR normal. Na/K 140/4.2. Rest of the CMP normal H/H 14/44. MCV 97. Platelets 246 Chol 184, TG 105, HDL 30, LDL 133 TSH 1.87 normal    Review  of Systems  Cardiovascular: Positive for chest pain. Negative for dyspnea on exertion, leg swelling, palpitations and syncope.  Musculoskeletal:       Rib, left wrist pain         Vitals:   04/11/20 1443  BP: 121/74  Pulse: 93  Resp: 17  SpO2: 99%    Body mass index is 23.8 kg/m. Filed Weights   04/11/20 1443  Weight: 143 lb (64.9 kg)     Objective:   Physical Exam Vitals and nursing note reviewed.  Constitutional:      General: She is not in acute distress. Neck:     Vascular: No JVD.  Cardiovascular:     Rate and Rhythm: Normal rate and regular rhythm.     Heart sounds: Normal heart  sounds. No murmur heard.   Pulmonary:     Effort: Pulmonary effort is normal.     Breath sounds: Normal breath sounds. No wheezing or rales.  Musculoskeletal:     Comments: Dressing on left forearm/wrist           Assessment & Recommendations:   49 y.o. Caucasian female with Crohn's disease, tobacco dependence, family h/o early CAD, atypical angina  Atypical angina: Risk factors include tobacco dependence, family history of CAD, hyperlipidemia.  Symptoms are atypical in nature, sometimes occurring at rest.  Previous normal exercise treadmill stress test in 2016.  Recommend CT coronary angiogram for definitive coronary evaluation.  In the meantime, recommend aspirin 81 mg, Lipitor 20 mg, metoprolol 25 mg twice daily, nitroglycerin as needed.  Currently, she is suffering more from her recent fall in the hospital wrist and a fracture.  Reasonable to proceed with treatment for this with surgical correction.  Perioperative cardiac risk for nonvascular surgery is low.  Follow-up after above testing.  Tobacco cessation counseling:  - Currently smoking 1-1.5 packs/day   - Patient was informed of the dangers of tobacco abuse including stroke, cancer, and MI, as well as benefits of tobacco cessation. - Patient is willing to quit at this time. - Approximately 5 mins were spent counseling patient cessation techniques. We discussed various methods to help quit smoking, including deciding on a date to quit, joining a support group, pharmacological agents. Patient would like to quit on her own.. - I will reassess her progress at the next follow-up visit   Nigel Mormon, MD Pager: [phone] Office: [phone]

## 2020-04-12 ENCOUNTER — Other Ambulatory Visit: Payer: Self-pay

## 2020-04-12 ENCOUNTER — Encounter (HOSPITAL_BASED_OUTPATIENT_CLINIC_OR_DEPARTMENT_OTHER): Payer: Self-pay | Admitting: Orthopedic Surgery

## 2020-04-12 NOTE — H&P (Signed)
Bailey Mcdaniel is an 50 y.o. female.   CC / Reason for Visit: Left hand injury HPI: This patient is a 49 year old female who presents for evaluation of a left hand injury.  She reports that yesterday she was holding her large dog on a leash, when he unexpectedly started to run, causing the leash to hurt her hand and also dragging her off the porch, causing her to fall onto an outstretched left hand and left side.  She is uncertain whether her injury occurred when the leash was pulled or during her fall, but nonetheless presents with pain and swelling of the left hand.  She reports that the pain is in the region of the proximal phalanx of the small finger.  Additionally she reports no pain at all at the level of the [facility] joints across the dorsum of the hand, nor can she recall any significant prior injury to the left hand   Past Medical History:  Diagnosis Date  . Anxiety and depression   . Asthma   . Back pain    s/p surgery 8-12  . Migraine     Past Surgical History:  Procedure Laterality Date  . ABDOMINAL HYSTERECTOMY    . BACK SURGERY  03/28/2011  . BACK SURGERY    . BLADDER SURGERY     at time of hysterectomy  . HAND SURGERY  2009   R hand, due to a Fx   . HAND SURGERY    . hsyterectomy and unilateral oophorectomy per patient      Family History  Problem Relation Age of Onset  . Sudden death Father   . Heart attack Father        F MI at age 62 and 23  . Cancer Mother        type?  Marland Kitchen Hypertension Mother   . Stroke Mother   . Diabetes Mother   . CAD Mother   . Colon cancer Neg Hx   . Breast cancer Neg Hx    Social History:  reports that she has been smoking cigarettes. She started smoking about 34 years ago. She has a 99.00 pack-year smoking history. She has never used smokeless tobacco. She reports previous alcohol use. She reports that she does not use drugs.  Allergies: No Known Allergies  No medications prior to admission.    No results found for this or any  previous visit (from the past 48 hour(s)). No results found.  Review of Systems  All other systems reviewed and are negative.   There were no vitals taken for this visit. Physical Exam  Constitutional:  WD, WN, NAD HEENT:  NCAT, EOMI Neuro/Psych:  Alert & oriented to person, place, and time; appropriate mood & affect Lymphatic: No generalized UE edema or lymphadenopathy Extremities / MSK:  Both UE are normal with respect to appearance, ranges of motion, joint stability, muscle strength/tone, sensation, & perfusion except as otherwise noted:  The left hand is swollen and bruised and tender to palpation along the proximal phalanx of the small finger.  There is a crossover deformity, the small finger over the ring finger.  She reports the deformity was much worse yesterday until she manipulated it and achieved what is present today.  There is no pain or swelling or bruising along the bases of the metacarpals, and no palpable step-off or defect as might be palpated with a CMC dislocation  Labs / Xrays:  3 views of the left hand ordered and obtained today reveals a  displaced fracture of the proximal phalanx of the small finger.  In addition there is an abnormality at the [facility] joint, particularly the second, which on the AP provide some overlap that could be worrisome for dislocation, but this is not evident on the lateral and may just be positional.  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  I reviewed the expected outcome with continued nonoperative treatment as well as options for operative intervention.  After careful consideration deliberation, she indicated she wished to proceed operatively.  We will plan likely for plate and screw fixation of the small finger P1 fracture after obtaining open reduction and correcting the degree of malrotation present.  The details of the operative procedure were discussed with the  patient.  Questions were invited and answered.  In addition to the goal of the procedure, the risks of the procedure to include but not limited to bleeding; infection; damage to the nerves or blood vessels that could result in bleeding, numbness, weakness, chronic pain, and the need for additional procedures; stiffness; the need for revision surgery; and anesthetic risks were reviewed.  No specific outcome was guaranteed or implied.  Informed consent was obtained.  Jodi Marble, MD 04/12/2020, 12:55 PM

## 2020-04-12 NOTE — Unmapped (Signed)
[facility] Clinical Assessment & Refill Coordination Note    Krista Williamson, DOB: 06-17-1971  Phone: [phone] (home)     All above HIPAA information was verified with patient.     Was a Nurse, learning disability used for this call? No    Specialty Medication(s):   Inflammatory Disorders: Humira     Current Outpatient Medications   Medication Sig Dispense Refill   ??? acetaminophen (TYLENOL) 325 MG tablet Frequency:   Dosage:0.0     Instructions:  Note:     ??? atorvastatin (LIPITOR) 10 MG tablet Take 10 mg by mouth.     ??? buPROPion (WELLBUTRIN SR) 150 MG 12 hr tablet Take 150 mg by mouth.     ??? cephalexin (KEFLEX) 500 MG capsule Take 500 mg by mouth Two (2) times a day.     ??? clindamycin (CLEOCIN) 300 MG capsule Take 300 MG BID with food 60 capsule 3   ??? clonazePAM (KLONOPIN) 1 MG tablet Take 1 mg by mouth daily as needed for anxiety.     ??? HUMIRA PEN CITRATE FREE 40 MG/0.4 ML Inject the contents of 2 pens (80 mg total) under the skin once a week. 8 each 6   ??? HYDROcodone-acetaminophen (NORCO) 5-325 mg per tablet Take 1 tablet by mouth.     ??? ibuprofen (ADVIL,MOTRIN) 200 MG tablet Take 400 mg by mouth.     ??? lubiprostone (AMITIZA) 8 MCG capsule Take 8 mcg by mouth 2 (two) times a day with meals.     ??? naproxen sodium (ALEVE) 220 MG tablet Take 440-660 mg by mouth.     ??? ofloxacin (OCUFLOX) 0.3 % ophthalmic solution Apply 5 drops in left ear twice daily for 7 days     ??? rifAMPin (RIFADIN) 300 MG capsule Take 1 capsule (300 mg total) by mouth Two (2) times a day. 60 capsule 2   ??? traMADol (ULTRAM) 50 mg tablet Take 1 tablet (50 mg total) by mouth every six (6) hours as needed for pain. 10 tablet 0     No current facility-administered medications for this visit.        Changes to medications: Krista Williamson reports no changes at this time.    No Known Allergies    Changes to allergies: No    SPECIALTY MEDICATION ADHERENCE     Humira 40mg /0.20ml: 0 days of medicine on hand     Medication Adherence Patient reported X missed doses in the last month: all  Specialty Medication: Humira 40mg /0.42ml          Specialty medication(s) dose(s) confirmed: Regimen is correct and unchanged.     Are there any concerns with adherence? 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    Adherence counseling provided? Yes: We discussed, while not ideal that life does tend to happen and the important part is that she is getting back on therapy.    CLINICAL MANAGEMENT AND INTERVENTION      Clinical Benefit Assessment:    Do you feel the medicine is effective or helping your condition? Patient declined to answer    Clinical Benefit counseling provided? Not needed    Adverse Effects Assessment:    Are you experiencing any side effects? No    Are you experiencing difficulty administering your medicine? No    Quality of Life Assessment:    How  many days over the past month did your HS  keep you from your normal activities? For example, brushing your teeth or getting up in the morning. Patient declined to answer    Have you discussed this with your provider? Not needed    Therapy Appropriateness:    Is therapy appropriate? Yes, therapy is appropriate and should be continued    DISEASE/MEDICATION-SPECIFIC INFORMATION      For patients on injectable medications: Patient currently has 0 doses left.  Next injection is scheduled for 04/18/2020.    PATIENT SPECIFIC NEEDS     - Does the patient have any physical, cognitive, or cultural barriers? No    - Is the patient high risk? No    - Does the patient require a Care Management Plan? No     - Does the patient require physician intervention or other additional services (i.e. nutrition, smoking cessation, social work)? No      SHIPPING     Specialty Medication(s) to be Shipped:   Inflammatory Disorders: Humira 40mg /0.28ml    Other medication(s) to be shipped: No additional medications requested for fill at this time     Changes to insurance: No    Delivery Scheduled: Yes, Expected medication delivery date: 04/16/2020.     Medication will be delivered via UPS to the confirmed prescription address in [facility].    The patient will receive a drug information handout for each medication shipped and additional FDA Medication Guides as required.  Verified that patient has previously received a Conservation officer, historic buildings.    All of the patient's questions and concerns have been addressed.    [name]   [facility] Specialty Pharmacist

## 2020-04-13 ENCOUNTER — Other Ambulatory Visit (HOSPITAL_COMMUNITY): Payer: Medicare Other

## 2020-04-15 ENCOUNTER — Other Ambulatory Visit (HOSPITAL_COMMUNITY)
Admission: RE | Admit: 2020-04-15 | Discharge: 2020-04-15 | Disposition: A | Discharge status: Home / Self Care | Payer: Medicare Other | Source: Ambulatory Visit | Attending: Orthopedic Surgery | Admitting: Orthopedic Surgery

## 2020-04-15 DIAGNOSIS — U071 COVID-19: Secondary | ICD-10-CM | POA: Diagnosis not present

## 2020-04-15 DIAGNOSIS — Z01812 Encounter for preprocedural laboratory examination: Secondary | ICD-10-CM | POA: Insufficient documentation

## 2020-04-15 MED FILL — HUMIRA PEN CITRATE FREE 40 MG/0.4 ML: 28 days supply | Qty: 8 | Fill #2 | Status: AC

## 2020-04-15 MED FILL — HUMIRA PEN CITRATE FREE 40 MG/0.4 ML: SUBCUTANEOUS | 28 days supply | Qty: 8 | Fill #2

## 2020-04-16 LAB — SARS CORONAVIRUS 2 (TAT 6-24 HRS): SARS Coronavirus 2: POSITIVE — AB

## 2020-04-16 NOTE — Progress Notes (Signed)
Francine Graven, surgery scheduler for Dr Janee Morn, to inform of Covid test result. Left voicemail message that surgery would need to be moved to main OR and that Dr Janee Morn needs to inform patient of test result.

## 2020-04-17 ENCOUNTER — Telehealth: Payer: Self-pay | Admitting: Internal Medicine

## 2020-04-17 ENCOUNTER — Other Ambulatory Visit: Payer: Self-pay

## 2020-04-17 HISTORY — DX: Other specified postprocedural states: R11.2

## 2020-04-17 NOTE — Telephone Encounter (Signed)
Called to Discuss with patient about Covid symptoms and the use of the monoclonal antibody infusion for those with mild to moderate Covid symptoms and at a high risk of hospitalization.     Pt qualifies for this infusion due to co-morbid conditions and/or a member of an at-risk group in accordance with the FDA Emergency Use Authorization. She has asthma and is on Humira for Crohn's disease. Symptoms began 9/13.    She wishes to check with insurance to see if cost and will respond to MyChart message if interested. Aware of 10 day window.   Marcy Salvo, NP [facility]

## 2020-04-17 NOTE — Progress Notes (Signed)
Pre-op call completed. Patient tested positive for covid 04/15/20 and is currently symptomatic. MD rescheduled surgery from 04/17/20 to 04/19/20 at main OR due to fracture needing repair ASAP. Patient given instructions to arrive at [facility] at 1100 am and call short stay on arrival, and we will meet her and escort her to the department. Visitors to remain in the car. Patient advised to take albuterol and nitro PRN.

## 2020-04-19 ENCOUNTER — Ambulatory Visit (HOSPITAL_COMMUNITY): Payer: Medicare Other | Admitting: Critical Care Medicine

## 2020-04-19 ENCOUNTER — Encounter (HOSPITAL_COMMUNITY): Payer: Self-pay | Admitting: Orthopedic Surgery

## 2020-04-19 ENCOUNTER — Ambulatory Visit (HOSPITAL_COMMUNITY)
Admission: RE | Admit: 2020-04-19 | Discharge: 2020-04-19 | Disposition: A | Discharge status: Home / Self Care | Payer: Medicare Other | Attending: Orthopedic Surgery | Admitting: Orthopedic Surgery

## 2020-04-19 ENCOUNTER — Encounter (HOSPITAL_COMMUNITY)
Admission: RE | Disposition: A | Discharge status: Home / Self Care | Payer: Self-pay | Source: Home / Self Care | Attending: Orthopedic Surgery

## 2020-04-19 ENCOUNTER — Ambulatory Visit (HOSPITAL_COMMUNITY): Payer: Medicare Other

## 2020-04-19 ENCOUNTER — Other Ambulatory Visit: Payer: Self-pay

## 2020-04-19 DIAGNOSIS — W1789XA Other fall from one level to another, initial encounter: Secondary | ICD-10-CM | POA: Insufficient documentation

## 2020-04-19 DIAGNOSIS — Y93K1 Activity, walking an animal: Secondary | ICD-10-CM | POA: Insufficient documentation

## 2020-04-19 DIAGNOSIS — Z419 Encounter for procedure for purposes other than remedying health state, unspecified: Secondary | ICD-10-CM

## 2020-04-19 DIAGNOSIS — Z90710 Acquired absence of both cervix and uterus: Secondary | ICD-10-CM | POA: Insufficient documentation

## 2020-04-19 DIAGNOSIS — S62616A Displaced fracture of proximal phalanx of right little finger, initial encounter for closed fracture: Secondary | ICD-10-CM | POA: Insufficient documentation

## 2020-04-19 DIAGNOSIS — F17200 Nicotine dependence, unspecified, uncomplicated: Secondary | ICD-10-CM | POA: Diagnosis not present

## 2020-04-19 HISTORY — DX: Nausea with vomiting, unspecified: Z98.890

## 2020-04-19 HISTORY — DX: Other specified postprocedural states: Z98.890

## 2020-04-19 HISTORY — PX: OPEN REDUCTION INTERNAL FIXATION (ORIF) PROXIMAL PHALANX: SHX6235

## 2020-04-19 SURGERY — OPEN REDUCTION INTERNAL FIXATION (ORIF) PROXIMAL PHALANX
Anesthesia: Monitor Anesthesia Care | Laterality: Left

## 2020-04-19 MED ORDER — MIDAZOLAM HCL 2 MG/2ML IJ SOLN
INTRAMUSCULAR | Status: AC
  Filled 2020-04-19: qty 2

## 2020-04-19 MED ORDER — PROPOFOL 500 MG/50ML IV EMUL
INTRAVENOUS | Status: DC | PRN
  Administered 2020-04-19: 75 ug/kg/min via INTRAVENOUS

## 2020-04-19 MED ORDER — ACETAMINOPHEN 325 MG PO TABS: 650.0000 mg | ORAL_TABLET | Freq: Four times a day (QID) | ORAL | 0 refills | Status: AC

## 2020-04-19 MED ORDER — DEXMEDETOMIDINE (PRECEDEX) IN NS 20 MCG/5ML (4 MCG/ML) IV SYRINGE
PREFILLED_SYRINGE | INTRAVENOUS | Status: DC | PRN
  Administered 2020-04-19: 8 ug via INTRAVENOUS

## 2020-04-19 MED ORDER — PROPOFOL 10 MG/ML IV BOLUS
INTRAVENOUS | Status: AC
  Filled 2020-04-19: qty 20

## 2020-04-19 MED ORDER — CHLORHEXIDINE GLUCONATE 0.12 % MT SOLN: 15.0000 mL | Freq: Once | OROMUCOSAL | Status: AC

## 2020-04-19 MED ORDER — FENTANYL CITRATE (PF) 250 MCG/5ML IJ SOLN
INTRAMUSCULAR | Status: AC
  Filled 2020-04-19: qty 5

## 2020-04-19 MED ORDER — ACETAMINOPHEN 500 MG PO TABS
ORAL_TABLET | ORAL | Status: AC
  Administered 2020-04-19: 1000 mg
  Filled 2020-04-19: qty 2

## 2020-04-19 MED ORDER — LACTATED RINGERS IV SOLN: INTRAVENOUS | Status: DC

## 2020-04-19 MED ORDER — 0.9 % SODIUM CHLORIDE (POUR BTL) OPTIME
TOPICAL | Status: DC | PRN
  Administered 2020-04-19: 1000 mL

## 2020-04-19 MED ORDER — OXYCODONE HCL 5 MG PO TABS: 5.0000 mg | ORAL_TABLET | Freq: Four times a day (QID) | ORAL | 0 refills | Status: DC | PRN

## 2020-04-19 MED ORDER — FENTANYL CITRATE (PF) 100 MCG/2ML IJ SOLN
INTRAMUSCULAR | Status: AC
Start: 2020-04-19 — End: 2020-04-19
  Administered 2020-04-19: 100 ug via INTRAVENOUS
  Filled 2020-04-19: qty 2

## 2020-04-19 MED ORDER — CHLORHEXIDINE GLUCONATE 0.12 % MT SOLN
OROMUCOSAL | Status: AC
  Administered 2020-04-19: 15 mL via OROMUCOSAL
  Filled 2020-04-19: qty 15

## 2020-04-19 MED ORDER — STERILE WATER FOR IRRIGATION IR SOLN
Status: DC | PRN
  Administered 2020-04-19: 1000 mL

## 2020-04-19 MED ORDER — FENTANYL CITRATE (PF) 100 MCG/2ML IJ SOLN: 100.0000 ug | Freq: Once | INTRAMUSCULAR | Status: AC

## 2020-04-19 MED ORDER — CEFAZOLIN SODIUM-DEXTROSE 2-4 GM/100ML-% IV SOLN
2.0000 g | INTRAVENOUS | Status: AC
  Administered 2020-04-19: 2 g via INTRAVENOUS
  Filled 2020-04-19: qty 100

## 2020-04-19 MED ORDER — BUPIVACAINE-EPINEPHRINE (PF) 0.5% -1:200000 IJ SOLN
INTRAMUSCULAR | Status: DC | PRN
  Administered 2020-04-19: 25 mL via PERINEURAL

## 2020-04-19 MED ORDER — MIDAZOLAM HCL 5 MG/5ML IJ SOLN
INTRAMUSCULAR | Status: DC | PRN
  Administered 2020-04-19 (×2): 1 mg via INTRAVENOUS

## 2020-04-19 MED ORDER — ORAL CARE MOUTH RINSE: 15.0000 mL | Freq: Once | OROMUCOSAL | Status: AC

## 2020-04-19 SURGICAL SUPPLY — 65 items
APL PRP STRL LF DISP 70% ISPRP (MISCELLANEOUS) ×1
BIT DRILL 1.1 (BIT) ×2
BIT DRILL 1.1MM (BIT) ×1
BIT DRILL 60X20X1.1XQC TMX (BIT) IMPLANT
BIT DRL 60X20X1.1XQC TMX (BIT) ×1
BLADE MINI RND TIP GREEN BEAV (BLADE) IMPLANT
BNDG CMPR 9X4 STRL LF SNTH (GAUZE/BANDAGES/DRESSINGS) ×1
BNDG COHESIVE 4X5 TAN STRL (GAUZE/BANDAGES/DRESSINGS) ×3 IMPLANT
BNDG ESMARK 4X9 LF (GAUZE/BANDAGES/DRESSINGS) ×3 IMPLANT
BNDG GAUZE ELAST 4 BULKY (GAUZE/BANDAGES/DRESSINGS) ×3 IMPLANT
CANISTER SUCT 3000ML PPV (MISCELLANEOUS) IMPLANT
CHLORAPREP W/TINT 26 (MISCELLANEOUS) ×3 IMPLANT
CORD BIPOLAR FORCEPS 12FT (ELECTRODE) ×3 IMPLANT
COVER MAYO STAND STRL (DRAPES) IMPLANT
COVER WAND RF STERILE (DRAPES) ×3 IMPLANT
CUFF TOURN SGL QUICK 18X4 (TOURNIQUET CUFF) IMPLANT
DRAPE C-ARM 42X72 X-RAY (DRAPES) ×3 IMPLANT
DRAPE SURG 17X23 STRL (DRAPES) ×3 IMPLANT
DRSG EMULSION OIL 3X3 NADH (GAUZE/BANDAGES/DRESSINGS) ×3 IMPLANT
GAUZE SPONGE 4X4 12PLY STRL (GAUZE/BANDAGES/DRESSINGS) ×2 IMPLANT
GAUZE SPONGE 4X4 12PLY STRL LF (GAUZE/BANDAGES/DRESSINGS) ×3 IMPLANT
GLOVE BIO SURGEON STRL SZ7.5 (GLOVE) ×3 IMPLANT
GLOVE BIOGEL PI IND STRL 7.0 (GLOVE) ×1 IMPLANT
GLOVE BIOGEL PI IND STRL 8 (GLOVE) ×1 IMPLANT
GLOVE BIOGEL PI INDICATOR 7.0 (GLOVE) ×2
GLOVE BIOGEL PI INDICATOR 8 (GLOVE) ×2
GLOVE ECLIPSE 6.5 STRL STRAW (GLOVE) ×3 IMPLANT
GOWN STRL REUS W/ TWL LRG LVL3 (GOWN DISPOSABLE) ×2 IMPLANT
GOWN STRL REUS W/ TWL XL LVL3 (GOWN DISPOSABLE) ×1 IMPLANT
GOWN STRL REUS W/TWL LRG LVL3 (GOWN DISPOSABLE) ×6
GOWN STRL REUS W/TWL XL LVL3 (GOWN DISPOSABLE) ×3
K-WIRE DBL TRONS .035X6 (WIRE) ×3
KIT BASIN OR (CUSTOM PROCEDURE TRAY) ×3 IMPLANT
KWIRE DBL TRONS .035X6 (WIRE) IMPLANT
LOCK SCREW 1.5X15MM (Screw) ×3 IMPLANT
LOCK SCREW 1.5X9MM (Screw) ×3 IMPLANT
NDL HYPO 25X1 1.5 SAFETY (NEEDLE) IMPLANT
NEEDLE HYPO 25X1 1.5 SAFETY (NEEDLE) IMPLANT
NS IRRIG 1000ML POUR BTL (IV SOLUTION) ×3 IMPLANT
PACK ORTHO EXTREMITY (CUSTOM PROCEDURE TRAY) ×3 IMPLANT
PAD CAST 3X4 CTTN HI CHSV (CAST SUPPLIES) IMPLANT
PAD CAST 4YDX4 CTTN HI CHSV (CAST SUPPLIES) IMPLANT
PADDING CAST COTTON 3X4 STRL (CAST SUPPLIES) ×3
PADDING CAST COTTON 4X4 STRL (CAST SUPPLIES)
PLATE T SMALL 1.5MM (Plate) ×2 IMPLANT
SCREW LOCK 1.5X15MM (Screw) IMPLANT
SCREW LOCK 1.5X9MM (Screw) IMPLANT
SCREW LOCKING 1.5X11MM (Screw) ×2 IMPLANT
SCREW LOCKING 1.5X13MM (Screw) ×2 IMPLANT
SCREW LOCKING 1.5X8 (Screw) ×4 IMPLANT
SLING ARM FOAM STRAP MED (SOFTGOODS) ×2 IMPLANT
SPLINT PLASTER EXTRA FAST 3X15 (CAST SUPPLIES) ×2
SPLINT PLASTER GYPS XFAST 3X15 (CAST SUPPLIES) IMPLANT
STOCKINETTE 6  STRL (DRAPES) ×3
STOCKINETTE 6 STRL (DRAPES) ×1 IMPLANT
SUCTION FRAZIER HANDLE 10FR (MISCELLANEOUS) ×3
SUCTION TUBE FRAZIER 10FR DISP (MISCELLANEOUS) IMPLANT
SUT VICRYL RAPIDE 4/0 PS 2 (SUTURE) IMPLANT
SYR 10ML LL (SYRINGE) IMPLANT
SYR BULB IRRIG 60ML STRL (SYRINGE) ×2 IMPLANT
TOWEL GREEN STERILE FF (TOWEL DISPOSABLE) ×3 IMPLANT
TOWEL OR NON WOVEN STRL DISP B (DISPOSABLE) IMPLANT
TUBE CONNECTING 20'X1/4 (TUBING) ×1
TUBE CONNECTING 20X1/4 (TUBING) ×1 IMPLANT
UNDERPAD 30X36 HEAVY ABSORB (UNDERPADS AND DIAPERS) ×3 IMPLANT

## 2020-04-19 NOTE — Interval H&P Note (Signed)
History and Physical Interval Note:  04/19/2020 1:36 PM  Bailey Mcdaniel  has presented today for surgery, with the diagnosis of LEFT SMALL FINGER FRACTURE.  The various methods of treatment have been discussed with the patient and family. After consideration of risks, benefits and other options for treatment, the patient has consented to  Procedure(s) with comments: OPEN TREATMENT OF LEFT SMALL FINGER FRACTURE (Left) - LENGTH OF SURGERY: 75 MIN  PRE OP BLOCK as a surgical intervention.  The patient's history has been reviewed, patient examined, no change in status, stable for surgery.  I have reviewed the patient's chart and labs.  Questions were answered to the patient's satisfaction.     Jodi Marble

## 2020-04-19 NOTE — Anesthesia Procedure Notes (Signed)
Procedure Name: MAC Performed by: Elin Fenley B, CRNA Pre-anesthesia Checklist: Patient identified, Emergency Drugs available, Suction available, Patient being monitored and Timeout performed Patient Re-evaluated:Patient Re-evaluated prior to induction Oxygen Delivery Method: Simple face mask Induction Type: IV induction Placement Confirmation: positive ETCO2 Dental Injury: Teeth and Oropharynx as per pre-operative assessment        

## 2020-04-19 NOTE — Discharge Instructions (Signed)
Regional Anesthesia  Regional anesthesia is a method used to temporarily block feeling in one area of the body. You may have regional anesthesia before a medical procedure or surgery. A health care provider who specializes in giving anesthesia (anesthesiologist) injects a type of medicine near a nerve or a group of nerves. This medicine makes that area of the body numb. Regional anesthesia allows you to be awake during the procedure or surgery but keeps you from feeling pain in the affected area. There are three types of regional anesthesia:  Spinal anesthesia. This is a one-time injection of medicine into the fluid that surrounds your spinal cord. This numbs the area below and slightly above the injection site.  Epidural anesthesia. This is another medicine that may be placed into your back, but just outside of the protective tissue that covers your spinal cord. Instead of a one-time injection, the medicine is often given gradually over time through a small tube (catheter)that remains in your back for as long as pain control is needed.  Peripheral nerve block. This is an injection that is given in an area of the body other than the spine to block all feeling below the injection site. Peripheral nerve blocks may be given as a single injection before your procedure or may be given through a catheter for as long as you need pain control. Regional anesthesia can be used alone or in combination with other types of anesthesia. Compared to using medicine that makes you fall asleep (general anesthetic), regional anesthesia has many benefits, such as:  Improved pain control after your surgery.  Less nausea, vomiting, or drowsiness after surgery.  A faster recovery. Tell a health care provider about:  Any allergies you have.  All medicines you are taking, including vitamins, herbs, eye drops, creams, and over-the-counter medicines.  Any use of drugs, alcohol, or tobacco.  Any problems you or family  members have had with anesthetic medicines.  Any blood disorders you have.  Any surgeries you have had.  Any medical conditions you have or have had, especially heart failure, chronic obstructive pulmonary disease (COPD), or sleep apnea.  Whether you are pregnant or may be pregnant. What are the risks? Generally, this is a safe procedure. However, problems may occur, including:  Pain.  Nausea.  Vomiting.  Itching.  Low blood pressure.  Headache.  Nerve damage.  Infection.  Bleeding around the injection site.  Trouble urinating.  Allergic reactions to medicine. What happens before the procedure? Staying hydrated Follow instructions from your health care provider about hydration, which may include:  Up to 2 hours before the procedure - you may continue to drink clear liquids, such as water, clear fruit juice, black coffee, and plain tea. Eating and drinking restrictions Follow instructions from your health care provider about eating and drinking, which may include:  8 hours before the procedure - stop eating heavy meals or foods, such as meat, fried foods, or fatty foods.  6 hours before the procedure - stop eating light meals or foods, such as toast or cereal.  6 hours before the procedure - stop drinking milk or drinks that contain milk.  2 hours before the procedure - stop drinking clear liquids. Medicines Ask your health care provider about:  Changing or stopping your regular medicines. This is especially important if you are taking diabetes medicines or blood thinners.  Taking medicines such as aspirin and ibuprofen. These medicines can thin your blood. Do not take these medicines unless your health care provider  tells you to take them.  Taking over-the-counter medicines, vitamins, herbs, and supplements. General instructions  Plan to have someone take you home from the hospital or clinic.  If you will be going home right after the procedure, plan to  have someone with you for 24 hours.  You may need to have blood or imaging tests.  Ask your health care provider what steps will be taken to help prevent infection. These may include washing skin with a germ-killing soap.  If you use a sleep apnea device, ask your health care provider whether you should bring it with you on the day of your surgery. What happens during the procedure?  Depending on the medical procedure you are having done, an IV may be inserted into one of your veins.  The anesthesiologist will do a physical exam to find the best location to give the regional anesthesia. To locate the nerve, he or she may also use: ? A device that activates the nerve and causes your muscles to twitch (nerve stimulator). ? An imaging tool that uses sound waves to create images of the area (ultrasound).  You may be given a medicine to help you relax (sedative).  A medicine called a local anesthetic may be injected to numb the area where the regional anesthetic will be injected.  You will get regional anesthesia by injection or through a catheter.  The anesthesiologist will check to make sure the medicine is working before the rest of your medical procedure begins.  Depending on the type of regional anesthesia you received, you may have a small bandage (dressing) placed over the injection site. The procedure may vary among health care providers and hospitals. What can I expect after the procedure? After your procedure, it is common to have:  Sleepiness.  Nausea.  Itching.  Numbness.  Shivering or feeling cold. Your blood pressure, heart rate, breathing rate, and blood oxygen level will be monitored until you leave the hospital or clinic. Follow these instructions at home:  Do not drive for 24 hours if you were given a sedative during your procedure.  Take over-the-counter and prescription medicines only as told by your health care provider.  Do not drive, exercise, or do any  other activities that require coordination for 24 hours or as told by your health care provider. Ask your health care provider when you can return to your usual activities.  Drink enough fluid to keep your urine pale yellow.  If you had a dressing placed over the injection site, only remove it when told to do so by your health care provider.  Keep all follow-up visits as told by your health care provider. This is important. Contact a health care provider if you:  Continue to have nausea and vomiting for more than 1 day.  Develop a rash.  Have trouble urinating. Get help right away if you:  Have bleeding from the injection site or bleeding under the skin at the injection site.  Have redness, swelling, or pain around your injection site.  Have a fever.  Develop a headache.  Develop new numbness or weakness. Summary  Regional anesthesia is a method used to temporarily block feeling in one area of the body. It may be done to block pain during a medical procedure or surgery.  Follow instructions from your health care provider about taking medicines and about eating and drinking before the procedure.  Ask your health care provider when you can return to your usual activities after the procedure.  This information is not intended to replace advice given to you by your health care provider. Make sure you discuss any questions you have with your health care provider. Document Revised: 09/05/2018 Document Reviewed: 09/05/2018 Elsevier Patient Education  2020 [employer]. Discharge Instructions   You have a dressing with a plaster splint incorporated in it. Move your fingers as much as possible, making a full fist and fully opening the fist. Elevate your hand to reduce pain & swelling of the digits.  Ice over the operative site may be helpful to reduce pain & swelling.  DO NOT USE HEAT. Pain medicine has been prescribed for you.  Take Naproxen as prescribed 2x per day. Take Tylenol  650 mg every 6 hours for pain. Take the prescribed pain medicine additionally for severe post operative pain as a rescue medicine. Leave the dressing in place until you return to our office.  You may shower, but keep the bandage clean & dry.  You may drive a car when you are off of prescription pain medications and can safely control your vehicle with both hands. Our office will call you to arrange follow-up   Please call [phone] during normal business hours or [phone] after hours for any problems. Including the following:  - excessive redness of the incisions - drainage for more than 4 days - fever of more than 101.5 F  *Please note that pain medications will not be refilled after hours or on weekends.  WORK STATUS: With the right hand, no lifting, gripping or grasping greater than pencil and paper tasks until first post operative appointment.

## 2020-04-19 NOTE — Op Note (Signed)
04/19/2020  1:36 PM  PATIENT:  Bailey Mcdaniel  49 y.o. female  PRE-OPERATIVE DIAGNOSIS:  L SF P1 fx  POST-OPERATIVE DIAGNOSIS:  Same  PROCEDURE:  ORIF L SF P1 fx  SURGEON: Rayvon Char. Grandville Silos, MD  PHYSICIAN ASSISTANT: Morley Kos, OPA-C  ANESTHESIA:  regional and MAC  SPECIMENS:  None  DRAINS:   None  EBL:  less than 50 mL  PREOPERATIVE INDICATIONS:  Bailey Mcdaniel is a  49 y.o. female with a displaced, malrotated L SF P1 fx  The risks benefits and alternatives were discussed with the patient preoperatively including but not limited to the risks of infection, bleeding, nerve injury, cardiopulmonary complications, the need for revision surgery, among others, and the patient verbalized understanding and consented to proceed.  OPERATIVE IMPLANTS: Biomet ALPS 1.70m plate/screws  OPERATIVE PROCEDURE:  After receiving prophylactic antibiotics and a regional block, the patient was escorted to the operative theatre and placed in a supine position.  A surgical "time-out" was performed during which the planned procedure, proposed operative site, and the correct patient identity were compared to the operative consent and agreement confirmed by the circulating nurse according to current facility policy.  Following application of a tourniquet to the operative extremity, the exposed skin was prescrubbed with a Hibiclens scrub brush before being formally prepped with Chloraprep and draped in the usual sterile fashion.  The limb was exsanguinated with an Esmarch bandage and the tourniquet inflated to approximately 1029mg higher than systolic BP.  A dorsal linear approach was made, incising the skin sharply and elevating full-thickness flaps.  The extensor apparatus was split in the midline and reflected.  The periosteum was split just ulnar of midline and the flaps reflected.  The fracture was identified and manipulated into gross alignment.  A small T plate from the Biomet set was  selected and tentatively applied, confirming its position fluoroscopically.  One of the holes was drilled and filled, but not fully tightened distally.  This allowed for the plate to be held but not in its final tight position.  Approximately the plate was applied, using in situ benders for the 2 wings of the T to help better approximate the tabs to the bone contour.  Once the plate was secured tightly to the proximal fragment, the fracture was more meticulously aligned and clamped and then the distal fragment screw tightened.  Additional distal screws were placed.  Screw lengths were checked fluoroscopically.  Fracture alignment was anatomic, with a normal touchdown point.  Wounds were irrigated, the periosteum reapproximated over the plate with 4-0 Vicryl Rapide running suture.  4-0 Vicryl running suture was then used to repair the extensor apparatus and the tourniquet was released.  Additional hemostasis was unnecessary and the skin was closed with 4-0 Vicryl Rapide running horizontal mattress suture.  A short arm splint dressing was applied and she was taken to recovery room in stable condition.  DISPOSITION: She will be discharged today with typical instructions, returning in a few days to therapy to have a splint fabricated and begin rehab, then 10 to 15 days postop with [place] new x-rays of left small finger out of splint.

## 2020-04-19 NOTE — Transfer of Care (Signed)
Immediate Anesthesia Transfer of Care Note  Patient: YIANNA TERSIGNI  Procedure(s) Performed: OPEN TREATMENT OF LEFT SMALL FINGER FRACTURE (Left )  Patient Location: OR 21  Anesthesia Type:MAC combined with regional for post-op pain  Level of Consciousness: awake, alert  and oriented  Airway & Oxygen Therapy: Patient Spontanous Breathing and Patient connected to face mask oxygen  Post-op Assessment: Report given to RN and Post -op Vital signs reviewed and stable  Post vital signs: Reviewed and stable  Last Vitals:  Vitals Value Taken Time  BP    Temp    Pulse    Resp    SpO2      Last Pain:  Vitals:   04/19/20 1330  TempSrc:   PainSc: 0-No pain         Complications: No complications documented.

## 2020-04-19 NOTE — Anesthesia Procedure Notes (Signed)
Anesthesia Regional Block: Axillary brachial plexus block   Pre-Anesthetic Checklist: ,, timeout performed, Correct Patient, Correct Site, Correct Laterality, Correct Procedure, Correct Position, site marked, Risks and benefits discussed, pre-op evaluation,  At surgeon's request and post-op pain management  Laterality: Left  Prep: Maximum Sterile Barrier Precautions used, chloraprep       Needles:  Injection technique: Single-shot  Needle Type: Other     Needle Length: 9cm  Needle Gauge: 21     Additional Needles:   Procedures:, nerve stimulator,,,,,,,  Narrative:  Start time: 04/19/2020 1:01 PM End time: 04/19/2020 1:11 PM Injection made incrementally with aspirations every 5 mL.  Additional Notes: 2% Lidocaine skin wheel.

## 2020-04-19 NOTE — Anesthesia Preprocedure Evaluation (Signed)
Anesthesia Evaluation  Patient identified by MRN, date of birth, ID band Patient awake    Reviewed: Allergy & Precautions, H&P , NPO status , Patient's Chart, lab work & pertinent test results  History of Anesthesia Complications (+) PONV  Airway Mallampati: II  TM Distance: >3 FB Neck ROM: Full    Dental no notable dental hx. (+) Teeth Intact, Dental Advisory Given   Pulmonary asthma , COPD, Current Smoker and Patient abstained from smoking.,    Pulmonary exam normal breath sounds clear to auscultation       Cardiovascular negative cardio ROS   Rhythm:Regular Rate:Normal     Neuro/Psych  Headaches, Anxiety Depression    GI/Hepatic negative GI ROS, Neg liver ROS,   Endo/Other  negative endocrine ROS  Renal/GU negative Renal ROS  negative genitourinary   Musculoskeletal   Abdominal   Peds  Hematology negative hematology ROS (+)   Anesthesia Other Findings   Reproductive/Obstetrics negative OB ROS                             Anesthesia Physical Anesthesia Plan  ASA: II  Anesthesia Plan: MAC and Regional   Post-op Pain Management:    Induction: Intravenous  PONV Risk Score and Plan: 2 and Propofol infusion and Midazolam  Airway Management Planned: Simple Face Mask  Additional Equipment:   Intra-op Plan:   Post-operative Plan:   Informed Consent: I have reviewed the patients History and Physical, chart, labs and discussed the procedure including the risks, benefits and alternatives for the proposed anesthesia with the patient or authorized representative who has indicated his/her understanding and acceptance.     Dental advisory given  Plan Discussed with: CRNA  Anesthesia Plan Comments:         Anesthesia Quick Evaluation

## 2020-04-19 NOTE — Anesthesia Postprocedure Evaluation (Signed)
Anesthesia Post Note  Patient: Bailey Mcdaniel  Procedure(s) Performed: OPEN TREATMENT OF LEFT SMALL FINGER FRACTURE (Left )     Patient location during evaluation: PACU Anesthesia Type: Regional and MAC Level of consciousness: awake and alert Pain management: pain level controlled Vital Signs Assessment: post-procedure vital signs reviewed and stable Respiratory status: spontaneous breathing, nonlabored ventilation and respiratory function stable Cardiovascular status: stable and blood pressure returned to baseline Postop Assessment: no apparent nausea or vomiting Anesthetic complications: no   No complications documented.  Last Vitals:  Vitals:   04/19/20 1526 04/19/20 1529  BP: 117/61 124/65  Pulse: 71 70  Resp:    Temp:    SpO2: 95% 95%    Last Pain:  Vitals:   04/19/20 1529  TempSrc:   PainSc: 0-No pain                 Bertina Guthridge,W. EDMOND

## 2020-04-22 ENCOUNTER — Telehealth: Payer: Self-pay | Admitting: Allergy

## 2020-04-22 ENCOUNTER — Encounter (HOSPITAL_COMMUNITY): Payer: Self-pay | Admitting: Orthopedic Surgery

## 2020-04-22 NOTE — Telephone Encounter (Signed)
Called and left a voicemail asking for patient to return call to discuss.  °

## 2020-04-22 NOTE — Telephone Encounter (Signed)
Please advise 

## 2020-04-22 NOTE — Telephone Encounter (Signed)
Patient states she tested positive to COVID and is very stopped up in her ears and nose. Patient would like to know if any medication can be called in. She is taking Zyrtec over the counter but it does not seem to be helping.  Uses [facility].  Please advise.

## 2020-04-22 NOTE — Telephone Encounter (Signed)
Please call back patient.  Did she get the monoclonal antibody infusion?  She can pick up zyrtec with a decongestant called Zyrtec-D and take daily, she can also get OTC Afrin nasal spray for the nasal congestion. Only use these 2 medications for up to 5 days.   These all over the counter and there isn't a prescription decongestant that works better.   Continue with the below medications:  Flonase 1 spray per nostril twice a day for nasal congestion.  May use azelastine nasal spray 1-2 sprays per nostril 1-2 times a day as needed for drainage.  Nasal saline spray (i.e., Simply Saline) or nasal saline lavage (i.e., NeilMed) is recommended as needed and prior to medicated nasal sprays.

## 2020-04-22 NOTE — Telephone Encounter (Signed)
Patient called back and was advised medication recommendations. Patient verbalized understanding.

## 2020-04-26 ENCOUNTER — Encounter (HOSPITAL_COMMUNITY): Payer: Self-pay | Admitting: Orthopedic Surgery

## 2020-05-02 ENCOUNTER — Ambulatory Visit (INDEPENDENT_AMBULATORY_CARE_PROVIDER_SITE_OTHER): Payer: Medicare Other | Admitting: Family Medicine

## 2020-05-02 ENCOUNTER — Other Ambulatory Visit: Payer: Self-pay

## 2020-05-02 ENCOUNTER — Encounter: Payer: Self-pay | Admitting: Family Medicine

## 2020-05-02 ENCOUNTER — Telehealth: Payer: Self-pay

## 2020-05-02 DIAGNOSIS — R059 Cough, unspecified: Secondary | ICD-10-CM | POA: Insufficient documentation

## 2020-05-02 DIAGNOSIS — J302 Other seasonal allergic rhinitis: Secondary | ICD-10-CM | POA: Diagnosis not present

## 2020-05-02 DIAGNOSIS — Z720 Tobacco use: Secondary | ICD-10-CM

## 2020-05-02 DIAGNOSIS — J449 Chronic obstructive pulmonary disease, unspecified: Secondary | ICD-10-CM | POA: Diagnosis not present

## 2020-05-02 DIAGNOSIS — H1010 Acute atopic conjunctivitis, unspecified eye: Secondary | ICD-10-CM

## 2020-05-02 DIAGNOSIS — R05 Cough: Secondary | ICD-10-CM | POA: Diagnosis not present

## 2020-05-02 DIAGNOSIS — J3089 Other allergic rhinitis: Secondary | ICD-10-CM

## 2020-05-02 MED ORDER — CARBINOXAMINE MALEATE 4 MG PO TABS: 8.0000 mg | ORAL_TABLET | Freq: Two times a day (BID) | ORAL | 0 refills | Status: DC

## 2020-05-02 MED ORDER — PREDNISONE 10 MG PO TABS: ORAL_TABLET | ORAL | 0 refills | Status: DC

## 2020-05-02 MED ORDER — SPIRIVA RESPIMAT 1.25 MCG/ACT IN AERS: 2.0000 | INHALATION_SPRAY | Freq: Every day | RESPIRATORY_TRACT | 2 refills | Status: DC

## 2020-05-02 MED ORDER — FLUTICASONE PROPIONATE 50 MCG/ACT NA SUSP: 2.0000 | Freq: Every day | NASAL | 5 refills | Status: DC

## 2020-05-02 NOTE — Patient Instructions (Signed)
Asthma COPD overlap Begin prednisone 10 mg tablets. Take 2 tablets twice a day for 3 days, then take 2 tablets once a day for 1 day, then take 1 tablet on the 5th day, then stop Increase Advair 230 to 2 puffs twice a day and begin using a spacer with this inhaler Begin Spiriva 1.25 mcg-2 puffs once a day to prevent cough or wheeze Continue albuterol 2 puffs once every 4 hours as needed for cough or wheeze  Allergic rhinitis Stop cetirizine (Zyrtec) and begin carbinoxamine 8 mg twice a day for nasal symptoms Begin Flonase 2 sprays in each nostril once a day for stuffy nose.  In the right nostril, point the applicator out toward the right ear. In the left nostril, point the applicator out toward the left ear Begin saline nasal rinses as needed for nasal symptoms. Use this before any medicated nasal sprays for best result Begin Mucinex (guaifenesin) 1200 mg twice a day and increase hydration in order to this mucus  Continue azelastine 2 sprays in each nostril twice a day as needed for a runny nose or sinus headache Continue allergen avoidance measures directed toward pollen, pets, dust mites, and cockroach as listed below  Allergic conjunctivitis Some over the counter eye drops include Pataday one drop in each eye once a day as needed for red, itchy eyes OR Zaditor one drop in each eye twice a day as needed for red itchy eyes.  Tobacco use Stop smoking or try to cut down on smoking.   Call the clinic if this treatment plan is not working well for you  Follow up in 2 weeks or sooner if needed.  Reducing Pollen Exposure The American Academy of Allergy, Asthma and Immunology suggests the following steps to reduce your exposure to pollen during allergy seasons. 1. Do not hang sheets or clothing out to dry; pollen may collect on these items. 2. Do not mow lawns or spend time around freshly cut grass; mowing stirs up pollen. 3. Keep windows closed at night.  Keep car windows closed while  driving. 4. Minimize morning activities outdoors, a time when pollen counts are usually at their highest. 5. Stay indoors as much as possible when pollen counts or humidity is high and on windy days when pollen tends to remain in the air longer. 6. Use air conditioning when possible.  Many air conditioners have filters that trap the pollen spores. 7. Use a HEPA room air filter to remove pollen form the indoor air you breathe.  Control of Dog or Cat Allergen Avoidance is the best way to manage a dog or cat allergy. If you have a dog or cat and are allergic to dog or cats, consider removing the dog or cat from the home. If you have a dog or cat but dont want to find it a new home, or if your family wants a pet even though someone in the household is allergic, here are some strategies that may help keep symptoms at bay:  8. Keep the pet out of your bedroom and restrict it to only a few rooms. Be advised that keeping the dog or cat in only one room will not limit the allergens to that room. 9. Dont pet, hug or kiss the dog or cat; if you do, wash your hands with soap and water. 10. High-efficiency particulate air (HEPA) cleaners run continuously in a bedroom or living room can reduce allergen levels over time. 11. Regular use of a high-efficiency vacuum cleaner or  a central vacuum can reduce allergen levels. 12. Giving your dog or cat a bath at least once a week can reduce airborne allergen.   Control of Dust Mite Allergen Dust mites play a major role in allergic asthma and rhinitis. They occur in environments with high humidity wherever human skin is found. Dust mites absorb humidity from the atmosphere (ie, they do not drink) and feed on organic matter (including shed human and animal skin). Dust mites are a microscopic type of insect that you cannot see with the naked eye. High levels of dust mites have been detected from mattresses, pillows, carpets, upholstered furniture, bed covers, clothes,  soft toys and any woven material. The principal allergen of the dust mite is found in its feces. A gram of dust may contain 1,000 mites and 250,000 fecal particles. Mite antigen is easily measured in the air during house cleaning activities. Dust mites do not bite and do not cause harm to humans, other than by triggering allergies/asthma.  Ways to decrease your exposure to dust mites in your home:  1. Encase mattresses, box springs and pillows with a mite-impermeable barrier or cover  2. Wash sheets, blankets and drapes weekly in hot water (130 F) with detergent and dry them in a dryer on the hot setting.  3. Have the room cleaned frequently with a vacuum cleaner and a damp dust-mop. For carpeting or rugs, vacuuming with a vacuum cleaner equipped with a high-efficiency particulate air (HEPA) filter. The dust mite allergic individual should not be in a room which is being cleaned and should wait 1 hour after cleaning before going into the room.  4. Do not sleep on upholstered furniture (eg, couches).  5. If possible removing carpeting, upholstered furniture and drapery from the home is ideal. Horizontal blinds should be eliminated in the rooms where the person spends the most time (bedroom, study, television room). Washable vinyl, roller-type shades are optimal.  6. Remove all non-washable stuffed toys from the bedroom. Wash stuffed toys weekly like sheets and blankets above.  7. Reduce indoor humidity to less than 50%. Inexpensive humidity monitors can be purchased at most hardware stores. Do not use a humidifier as can make the problem worse and are not recommended.  Control of Cockroach Allergen  Cockroach allergen has been identified as an important cause of acute attacks of asthma, especially in urban settings.  There are fifty-five species of cockroach that exist in the [place], however only three, the [place], [place] species produce allergen that can affect patients  with Asthma.  Allergens can be obtained from fecal particles, egg casings and secretions from cockroaches.    1. Remove food sources. 2. Reduce access to water. 3. Seal access and entry points. 4. Spray runways with 0.5-1% Diazinon or Chlorpyrifos 5. Blow boric acid power under stoves and refrigerator. 6. Place bait stations (hydramethylnon) at feeding sites.

## 2020-05-02 NOTE — Progress Notes (Signed)
RE: Bailey Mcdaniel MRN: [id number] DOB: October 25, 1970 Date of Telemedicine Visit: 05/02/2020  Referring provider: Elisabeth Most, FNP Primary care provider: Elisabeth Most, FNP  Chief Complaint: Sinusitis   Telemedicine Follow Up Visit via Telephone: I connected with Bailey Mcdaniel for a follow up on 05/02/20 by telephone and verified that I am speaking with the correct person using two identifiers.   I discussed the limitations, risks, security and privacy concerns of performing an evaluation and management service by telephone and the availability of in person appointments. I also discussed with the patient that there may be a patient responsible charge related to this service. The patient expressed understanding and agreed to proceed.  Patient is at home  Provider is at the office.  Visit start time: 1119 Visit end time: 61 Insurance consent/check in by: [facility] Medical consent and medical assistant/nurse: DA'SHAUNIA B  History of Present Illness: She is a 49 y.o. female, who is being followed for asthma COPD overlap syndrome, allergic rhinitis, allergic conjunctivitis, and tobacco use. Her previous allergy office visit was on 11/15/2019 with Dr. Selena Mcdaniel. At today's visit, she reports her asthma has been poorly controlled with symptoms including shortness of breath which is worse with activity and cough occurring with activity, in the morning and in the evening which is described as sometimes dry and sometimes producing clear to yellow phlegm. She reports her symptoms started in the first week of September and worsenedShe continues Advair 230-2 puffs once a day without a spacer and albuterol 2 times a day with moderate relief of symptoms. She has just finished taking a prednisone taper and amoxacillin with little improvement. Allergic rhinitis is reported as not well controlled with symptoms including clear rhinorrhea, nasal congestion and post nasal drainage for which she is taking  cetirizine once a day and using azelastine occasionally. She is not currently using nasal saline rinses or Flonase. She reports a full feeling in both ears, however, the right ear is worse. Allergic conjunctivitis is well controlled.   Assessment and Plan: Bailey Mcdaniel is a 49 y.o. female with: Patient Instructions  Asthma COPD overlap Begin prednisone 10 mg tablets. Take 2 tablets twice a day for 3 days, then take 2 tablets once a day for 1 day, then take 1 tablet on the 5th day, then stop Increase Advair 230 to 2 puffs twice a day and begin using a spacer with this inhaler Begin Spiriva 1.25 mcg-2 puffs once a day to prevent cough or wheeze Continue albuterol 2 puffs once every 4 hours as needed for cough or wheeze If no improvement in 1-2 days, will move forward with chest xray We will check spirometry at your follow up visit  Allergic rhinitis Stop cetirizine (Zyrtec) and begin carbinoxamine 8 mg twice a day for nasal symptoms Begin Flonase 2 sprays in each nostril once a day for stuffy nose.  In the right nostril, point the applicator out toward the right ear. In the left nostril, point the applicator out toward the left ear Begin saline nasal rinses as needed for nasal symptoms. Use this before any medicated nasal sprays for best result Begin Mucinex (guaifenesin) 1200 mg twice a day and increase hydration in order to this mucus  Continue azelastine 2 sprays in each nostril twice a day as needed for a runny nose or sinus headache Continue allergen avoidance measures directed toward pollen, pets, dust mites, and cockroach as listed below  Allergic conjunctivitis Some over the counter eye drops include Pataday one drop  in each eye once a day as needed for red, itchy eyes OR Zaditor one drop in each eye twice a day as needed for red itchy eyes.  Tobacco use Stop smoking or try to cut down on smoking.   Call the clinic if you develop a fever, your symptoms worsen or your symptoms do not  improve.   Follow up in 2 weeks or sooner if needed   Return in about 2 weeks (around 05/16/2020), or if symptoms worsen or fail to improve.  Meds ordered this encounter  Medications  . Tiotropium Bromide Monohydrate (SPIRIVA RESPIMAT) 1.25 MCG/ACT AERS    Sig: Inhale 2 puffs into the lungs daily.    Dispense:  1 each    Refill:  2  . Carbinoxamine Maleate 4 MG TABS    Sig: Take 2 tablets (8 mg total) by mouth in the morning and at bedtime.    Dispense:  28 tablet    Refill:  0  . fluticasone (FLONASE) 50 MCG/ACT nasal spray    Sig: Place 2 sprays into both nostrils daily.    Dispense:  1 g    Refill:  5  . predniSONE (DELTASONE) 10 MG tablet    Sig: Begin prednisone 10 mg tablets. Take 2 tablets twice a day for 3 days, then take 2 tablets once a day for 1 day, then take 1 tablet on the 5th day, then stop    Dispense:  15 tablet    Refill:  0    Medication List:  Current Outpatient Medications  Medication Sig Dispense Refill  . albuterol (VENTOLIN HFA) 108 (90 Base) MCG/ACT inhaler Inhale 2 puffs into the lungs every 4 (four) hours as needed for wheezing or shortness of breath. 18 g 2  . azelastine (ASTELIN) 0.1 % nasal spray Take 1-2 sprays per nostril twice a day as needed for drainage. 30 mL 5  . fluticasone (FLONASE) 50 MCG/ACT nasal spray     . Olopatadine HCl 0.2 % SOLN Apply 1 drop to eye daily as needed (itchy/watery eyes). 2.5 mL 5  . Adalimumab (HUMIRA) 40 MG/0.8ML PSKT Inject 40 mg into the skin 2 (two) times a week.     Marland Kitchen aspirin EC 81 MG tablet Take 1 tablet (81 mg total) by mouth daily. Swallow whole. (Patient not taking: Reported on 04/17/2020) 30 tablet 3  . atorvastatin (LIPITOR) 20 MG tablet Take 0.5 tablets (10 mg total) by mouth daily. 30 tablet 2  . b complex vitamins tablet Take 1 tablet by mouth daily.    . Carbinoxamine Maleate 4 MG TABS Take 2 tablets (8 mg total) by mouth in the morning and at bedtime. 28 tablet 0  . Cholecalciferol (PA VITAMIN D-3) 50  MCG (2000 UT) CAPS Take 2,000 Units by mouth daily.    . clonazePAM (KLONOPIN) 1 MG tablet Take 1 mg by mouth daily.     . fluticasone (FLONASE) 50 MCG/ACT nasal spray Place 2 sprays into both nostrils daily. 1 g 5  . levocetirizine (XYZAL) 5 MG tablet Take 1 tablet (5 mg total) by mouth every evening. (Patient not taking: Reported on 04/17/2020) 30 tablet 5  . metoprolol tartrate (LOPRESSOR) 25 MG tablet Take 1 tablet (25 mg total) by mouth 2 (two) times daily. (Patient not taking: Reported on 04/17/2020) 60 tablet 3  . Multiple Vitamins-Minerals (EMERGEN-C IMMUNE) PACK Take 1 packet by mouth daily.    . naproxen (NAPROSYN) 500 MG tablet Take 1 tablet (500 mg total) by mouth  2 (two) times daily. (Patient not taking: Reported on 04/17/2020) 20 tablet 0  . nitroGLYCERIN (NITROSTAT) 0.4 MG SL tablet Place 1 tablet (0.4 mg total) under the tongue every 5 (five) minutes as needed for chest pain. 30 tablet 3  . Omega-3 1000 MG CAPS Take 1,000 capsules by mouth daily.    Marland Kitchen oxyCODONE (ROXICODONE) 5 MG immediate release tablet Take 1 tablet (5 mg total) by mouth every 6 (six) hours as needed for severe pain. 20 tablet 0  . predniSONE (DELTASONE) 10 MG tablet Begin prednisone 10 mg tablets. Take 2 tablets twice a day for 3 days, then take 2 tablets once a day for 1 day, then take 1 tablet on the 5th day, then stop 15 tablet 0  . Tiotropium Bromide Monohydrate (SPIRIVA RESPIMAT) 1.25 MCG/ACT AERS Inhale 2 puffs into the lungs daily. 1 each 2   No current facility-administered medications for this visit.   Allergies: No Known Allergies I reviewed her past medical history, social history, family history, and environmental history and no significant changes have been reported from previous visit on 11/15/2019.  Objective: Physical Exam Not obtained as encounter was done via telephone.   Previous notes and tests were reviewed.  I discussed the assessment and treatment plan with the patient. The patient was  provided an opportunity to ask questions and all were answered. The patient agreed with the plan and demonstrated an understanding of the instructions.   The patient was advised to call back or seek an in-person evaluation if the symptoms worsen or if the condition fails to improve as anticipated.  I provided 29 minutes of non-face-to-face time during this encounter.  It was my pleasure to participate in Arbela Barrett's care today. Please feel free to contact me with any questions or concerns.   Sincerely,  Thermon Leyland, FNP

## 2020-05-02 NOTE — Telephone Encounter (Signed)
PA for Carbinoxamine submitted on Covermymeds. Waiting on approval/denial from insurance.

## 2020-05-03 NOTE — Telephone Encounter (Signed)
PA has been approved for Carbinoxamine. PA form has been faxed to pharmacy, labeled, and placed in bulk scanning.

## 2020-05-08 ENCOUNTER — Telehealth: Payer: Self-pay | Admitting: Family Medicine

## 2020-05-08 ENCOUNTER — Telehealth: Payer: Self-pay

## 2020-05-08 NOTE — Telephone Encounter (Signed)
Patient states she was suppose to call Thurston Hole back on Monday with an update, but she was still feeling bad. Patient states she has one more prednisone tablet to take today, but she is still stuffy. Patient does feel better than she did and she can hear a lot better. Patient would like to know if more medication should be called in since it is helping or if something else should be called in.  Please advise.

## 2020-05-08 NOTE — Telephone Encounter (Signed)
Please make sure the patient is using spiriva in addition to her other inhalers and also carbinoxamine. Please find out how often she is taking carbinoxamine. Please make sure she is still using nasal saline rinses and a nasal steroid spray. If she continues to cough please order a 2 view chest xray. Thank you

## 2020-05-08 NOTE — Telephone Encounter (Signed)
Please advice . Thank you

## 2020-05-09 ENCOUNTER — Ambulatory Visit (INDEPENDENT_AMBULATORY_CARE_PROVIDER_SITE_OTHER): Payer: Medicare Other | Admitting: Otolaryngology

## 2020-05-09 NOTE — Telephone Encounter (Signed)
Unable to reach patient. Left voicemail for patient to return call

## 2020-05-10 NOTE — Telephone Encounter (Signed)
Spoke with Bailey Mcdaniel, she just picked up her Carbinoxamine yesterday because the pharmacy did not have it until then. She can tell a big difference in her symptoms since starting it. She did say that her cough is still there but it's getting better. She is taking the Carbinoxamine twice daily. I told her to call us if her cough worsens or does not go away.

## 2020-05-10 NOTE — Telephone Encounter (Signed)
Done

## 2020-05-10 NOTE — Telephone Encounter (Signed)
Thank you :)

## 2020-05-13 ENCOUNTER — Telehealth (HOSPITAL_COMMUNITY): Payer: Self-pay | Admitting: Emergency Medicine

## 2020-05-13 NOTE — Telephone Encounter (Signed)
Unable to leave VM. VM box full  Rockwell Alexandria RN Navigator Cardiac Imaging [facility] [phone] Office  [phone] Cell

## 2020-05-15 ENCOUNTER — Ambulatory Visit (HOSPITAL_COMMUNITY): Admission: RE | Admit: 2020-05-15 | Payer: Medicare Other | Source: Ambulatory Visit

## 2020-05-16 ENCOUNTER — Telehealth: Payer: Self-pay

## 2020-05-16 NOTE — Telephone Encounter (Signed)
Thanks

## 2020-05-22 ENCOUNTER — Ambulatory Visit: Payer: Medicare Other | Admitting: Cardiology

## 2020-05-23 NOTE — Telephone Encounter (Signed)
Please advise to pt having congestion still

## 2020-05-23 NOTE — Telephone Encounter (Signed)
Patient called and states that her symptoms have cleared a little but not as expected. Patient is still stopped up on her right side. Patient would like to know what she can do as this has been going on for over a month.  Please advise.

## 2020-05-23 NOTE — Unmapped (Signed)
Ms. Krista Williamson was held for 2 weeks due to finger surgery.  Her MD was worried the Humira would delay healing.  He approved for Krista Williamson to restart the medicine today.  She has 2 doses on hand to begin therapy.  She did start flaring recently so is eager for relief.      [facility] Clinical Assessment & Refill Coordination Note    Krista Williamson, DOB: [birth year]  Phone: [phone] (home) `    All above HIPAA information was verified with patient.     Was a Nurse, learning disability used for this call? No    Specialty Medication(s):   Inflammatory Disorders: Humira     Current Outpatient Medications   Medication Sig Dispense Refill   ??? acetaminophen (TYLENOL) 325 MG tablet Frequency:   Dosage:0.0     Instructions:  Note:     ??? atorvastatin (LIPITOR) 10 MG tablet Take 10 mg by mouth.     ??? buPROPion (WELLBUTRIN SR) 150 MG 12 hr tablet Take 150 mg by mouth.     ??? cephalexin (KEFLEX) 500 MG capsule Take 500 mg by mouth Two (2) times a day.     ??? clindamycin (CLEOCIN) 300 MG capsule Take 300 MG BID with food 60 capsule 3   ??? clonazePAM (KLONOPIN) 1 MG tablet Take 1 mg by mouth daily as needed for anxiety.     ??? HUMIRA PEN CITRATE FREE 40 MG/0.4 ML Inject the contents of 2 pens (80 mg total) under the skin once a week. 8 each 6   ??? HYDROcodone-acetaminophen (NORCO) 5-325 mg per tablet Take 1 tablet by mouth.     ??? ibuprofen (ADVIL,MOTRIN) 200 MG tablet Take 400 mg by mouth.     ??? lubiprostone (AMITIZA) 8 MCG capsule Take 8 mcg by mouth 2 (two) times a day with meals.     ??? naproxen sodium (ALEVE) 220 MG tablet Take 440-660 mg by mouth.     ??? ofloxacin (OCUFLOX) 0.3 % ophthalmic solution Apply 5 drops in left ear twice daily for 7 days     ??? rifAMPin (RIFADIN) 300 MG capsule Take 1 capsule (300 mg total) by mouth Two (2) times a day. 60 capsule 2   ??? traMADol (ULTRAM) 50 mg tablet Take 1 tablet (50 mg total) by mouth every six (6) hours as needed for pain. 10 tablet 0     No current facility-administered medications for this visit.        Changes to medications: Krista Williamson reports no changes at this time.    No Known Allergies    Changes to allergies: No    SPECIALTY MEDICATION ADHERENCE     Humira 40/0.4 mg/ml: 14 days of medicine on hand     Medication Adherence    Patient reported X missed doses in the last month: 2  Specialty Medication: Humira 40 mg/0.4 ml  Patient is on additional specialty medications: No  Informant: patient  Confirmed plan for next specialty medication refill: delivery by pharmacy  Refills needed for supportive medications: not needed          Specialty medication(s) dose(s) confirmed: Regimen is correct and unchanged.     Are there any concerns with adherence? No    Adherence counseling provided? Not needed    CLINICAL MANAGEMENT AND INTERVENTION      Clinical Benefit Assessment:    Do you feel the medicine is effective or helping your condition? Yes    Clinical Benefit counseling provided?  Not needed    Adverse Effects Assessment:    Are you experiencing any side effects? No    Are you experiencing difficulty administering your medicine? No    Quality of Life Assessment:    How many days over the past month did your HS  keep you from your normal activities? For example, brushing your teeth or getting up in the morning. 0    Have you discussed this with your provider? Not needed    Therapy Appropriateness:    Is therapy appropriate? Yes, therapy is appropriate and should be continued    DISEASE/MEDICATION-SPECIFIC INFORMATION      For patients on injectable medications: Patient currently has 0 doses left.  Next injection is scheduled for 05/23/20.    PATIENT SPECIFIC NEEDS     - Does the patient have any physical, cognitive, or cultural barriers? No    - Is the patient high risk? No    - Does the patient require a Care Management Plan? No     - Does the patient require physician intervention or other additional services (i.e. nutrition, smoking cessation, social work)? No      SHIPPING     Specialty Medication(s) to be Shipped:   Inflammatory Disorders: Humira    Other medication(s) to be shipped: No additional medications requested for fill at this time     Changes to insurance: No    Delivery Scheduled: Yes, Expected medication delivery date: 05/30/20.     Medication will be delivered via UPS to the confirmed prescription address in [facility].    The patient will receive a drug information handout for each medication shipped and additional FDA Medication Guides as required.  Verified that patient has previously received a Conservation officer, historic buildings.    All of the patient's questions and concerns have been addressed.    Krista Williamson Vangie Bicker   [facility] Specialty Pharmacist

## 2020-05-24 ENCOUNTER — Other Ambulatory Visit: Payer: Self-pay

## 2020-05-24 MED ORDER — FLUTICASONE PROPIONATE 50 MCG/ACT NA SUSP: 2.0000 | Freq: Two times a day (BID) | NASAL | 1 refills | Status: DC | PRN

## 2020-05-24 MED ORDER — CARBINOXAMINE MALEATE 4 MG PO TABS
8.0000 mg | ORAL_TABLET | Freq: Two times a day (BID) | ORAL | 0 refills | Status: DC
Start: 2020-05-24 — End: 2020-09-24

## 2020-05-24 NOTE — Telephone Encounter (Signed)
Please have her increase carbinal to three times a day, continue flonase 2 sprays in each nostril once a day, azelastine 2 sprays in each nostril once a day, saline rinses several times a day. Thank you

## 2020-05-24 NOTE — Telephone Encounter (Signed)
Pt informed and sent in refills for flonase and carbinoxamine

## 2020-05-28 ENCOUNTER — Telehealth (HOSPITAL_COMMUNITY): Payer: Self-pay | Admitting: *Deleted

## 2020-05-28 NOTE — Telephone Encounter (Signed)

## 2020-05-29 ENCOUNTER — Ambulatory Visit (HOSPITAL_COMMUNITY)
Admission: RE | Admit: 2020-05-29 | Discharge: 2020-05-29 | Disposition: A | Discharge status: Home / Self Care | Payer: Medicare Other | Source: Ambulatory Visit | Attending: Cardiology | Admitting: Cardiology

## 2020-05-29 ENCOUNTER — Other Ambulatory Visit: Payer: Self-pay

## 2020-05-29 DIAGNOSIS — I25119 Atherosclerotic heart disease of native coronary artery with unspecified angina pectoris: Secondary | ICD-10-CM | POA: Diagnosis not present

## 2020-05-29 DIAGNOSIS — I209 Angina pectoris, unspecified: Secondary | ICD-10-CM | POA: Diagnosis present

## 2020-05-29 DIAGNOSIS — I700 Atherosclerosis of aorta: Secondary | ICD-10-CM | POA: Diagnosis not present

## 2020-05-29 DIAGNOSIS — Z8249 Family history of ischemic heart disease and other diseases of the circulatory system: Secondary | ICD-10-CM | POA: Diagnosis not present

## 2020-05-29 MED ORDER — IOHEXOL 350 MG/ML SOLN
80.0000 mL | Freq: Once | INTRAVENOUS | Status: AC | PRN
  Administered 2020-05-29: 80 mL via INTRAVENOUS

## 2020-05-29 MED ORDER — METOPROLOL TARTRATE 5 MG/5ML IV SOLN
INTRAVENOUS | Status: AC
  Filled 2020-05-29: qty 20

## 2020-05-29 MED ORDER — NITROGLYCERIN 0.4 MG SL SUBL: 0.8000 mg | SUBLINGUAL_TABLET | Freq: Once | SUBLINGUAL | Status: AC

## 2020-05-29 MED ORDER — NITROGLYCERIN 0.4 MG SL SUBL
SUBLINGUAL_TABLET | SUBLINGUAL | Status: AC
  Administered 2020-05-29: 0.4 mg via SUBLINGUAL
  Filled 2020-05-29: qty 1

## 2020-05-29 MED ORDER — METOPROLOL TARTRATE 5 MG/5ML IV SOLN
5.0000 mg | INTRAVENOUS | Status: DC | PRN
  Administered 2020-05-29: 5 mg via INTRAVENOUS

## 2020-05-29 MED FILL — HUMIRA PEN CITRATE FREE 40 MG/0.4 ML: SUBCUTANEOUS | 28 days supply | Qty: 8 | Fill #3

## 2020-05-29 MED FILL — HUMIRA PEN CITRATE FREE 40 MG/0.4 ML: 28 days supply | Qty: 8 | Fill #3 | Status: AC

## 2020-05-30 NOTE — Telephone Encounter (Signed)
Patient is still feeling the same. Patient is using Flonase, Spirva, Carbinoxamine, and Azelastine. Patient states she feels better for about 40 minutes to an hour after taking the medication then its right back to feeling bad. Patient says that she does think that the medication is helping, because the cough has stopped and she can breathe better. However, patient's ears are stopped up and she can barely hear. Patient states when she tilts her head back or forward it feels like the fluid goes away and she can hear. Patient was referred by Dr. Ezzard Standing, ENT, first and he drained her ears and stated that she had a lot of fluid and it helped her for about 5-6 months.  Please advise what patient should do.

## 2020-05-30 NOTE — Telephone Encounter (Signed)
Mailbox is full. Sent SMS message through her phone service with the [facility] phone number. If she calls the [facility] clinic again, can you please have her call [facility]? Thank you

## 2020-05-31 NOTE — Telephone Encounter (Signed)
Can you please make an appointment for this patient in the clinic? Thank you

## 2020-06-03 NOTE — Telephone Encounter (Signed)
Patient got an appointment with ENT and she has tons of fluid in her right ear, not as much in the left. 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 Patient has an appointment on 11/18 with the ENT to drain her ears and have tubes put it.   Please advise if patient should be seen before or after her ENT appointment.

## 2020-06-03 NOTE — Telephone Encounter (Signed)
I am so glad to hear that she has an appointment with ENT as I think this is going to be most beneficial for her. Lets see her after her ENT appointment please. Thank you

## 2020-06-03 NOTE — Telephone Encounter (Signed)
Left voicemail for patient to call back and make an appointment after her ENT appointment.

## 2020-06-06 ENCOUNTER — Ambulatory Visit: Payer: Medicare Other | Admitting: Cardiology

## 2020-06-06 ENCOUNTER — Encounter: Payer: Self-pay | Admitting: Cardiology

## 2020-06-06 ENCOUNTER — Other Ambulatory Visit: Payer: Self-pay

## 2020-06-06 VITALS — BP 122/76 | HR 77 | Resp 16 | Ht 65.0 in | Wt 145.0 lb

## 2020-06-06 DIAGNOSIS — E782 Mixed hyperlipidemia: Secondary | ICD-10-CM

## 2020-06-06 DIAGNOSIS — F17200 Nicotine dependence, unspecified, uncomplicated: Secondary | ICD-10-CM

## 2020-06-06 DIAGNOSIS — Z8249 Family history of ischemic heart disease and other diseases of the circulatory system: Secondary | ICD-10-CM

## 2020-06-06 DIAGNOSIS — I209 Angina pectoris, unspecified: Secondary | ICD-10-CM

## 2020-06-06 MED ORDER — ATORVASTATIN CALCIUM 20 MG PO TABS: 20.0000 mg | ORAL_TABLET | Freq: Every day | ORAL | 2 refills | Status: DC

## 2020-06-06 NOTE — Progress Notes (Signed)
Follow up visit  Subjective:   Bailey Mcdaniel, female    DOB: 03-Jun-1971, 49 y.o.   MRN: [id number]   HPI  Chief Complaint  Patient presents with  . Family history of early CAD  . Follow-up  . Results    49 y.o. Caucasian female with Crohn's disease, tobacco dependence, family h/o early CAD, atypical angina   Since her initial visit with me, she was diagnosed with Covid incidentally prior to her shoulder surgery.  She has since recovered from it.  He underwent coronary CT angiogram that showed mild noncalcified plaque with calcium score of 0.    Current Outpatient Medications on File Prior to Visit  Medication Sig Dispense Refill  . Adalimumab (HUMIRA) 40 MG/0.8ML PSKT Inject 40 mg into the skin 2 (two) times a week.     Marland Kitchen albuterol (VENTOLIN HFA) 108 (90 Base) MCG/ACT inhaler Inhale 2 puffs into the lungs every 4 (four) hours as needed for wheezing or shortness of breath. 18 g 2  . aspirin EC 81 MG tablet Take 1 tablet (81 mg total) by mouth daily. Swallow whole. (Patient not taking: Reported on 04/17/2020) 30 tablet 3  . atorvastatin (LIPITOR) 20 MG tablet Take 0.5 tablets (10 mg total) by mouth daily. 30 tablet 2  . azelastine (ASTELIN) 0.1 % nasal spray Take 1-2 sprays per nostril twice a day as needed for drainage. 30 mL 5  . b complex vitamins tablet Take 1 tablet by mouth daily.    . Carbinoxamine Maleate 4 MG TABS Take 2 tablets (8 mg total) by mouth in the morning and at bedtime. 90 tablet 0  . Cholecalciferol (PA VITAMIN D-3) 50 MCG (2000 UT) CAPS Take 2,000 Units by mouth daily.    . clonazePAM (KLONOPIN) 1 MG tablet Take 1 mg by mouth daily.     . fluticasone (FLONASE) 50 MCG/ACT nasal spray     . fluticasone (FLONASE) 50 MCG/ACT nasal spray Place 2 sprays into both nostrils 2 (two) times daily as needed for allergies or rhinitis. 1 g 1  . levocetirizine (XYZAL) 5 MG tablet Take 1 tablet (5 mg total) by mouth every evening. (Patient not taking: Reported on  04/17/2020) 30 tablet 5  . metoprolol tartrate (LOPRESSOR) 25 MG tablet Take 1 tablet (25 mg total) by mouth 2 (two) times daily. (Patient not taking: Reported on 04/17/2020) 60 tablet 3  . Multiple Vitamins-Minerals (EMERGEN-C IMMUNE) PACK Take 1 packet by mouth daily.    . naproxen (NAPROSYN) 500 MG tablet Take 1 tablet (500 mg total) by mouth 2 (two) times daily. (Patient not taking: Reported on 04/17/2020) 20 tablet 0  . nitroGLYCERIN (NITROSTAT) 0.4 MG SL tablet Place 1 tablet (0.4 mg total) under the tongue every 5 (five) minutes as needed for chest pain. 30 tablet 3  . Olopatadine HCl 0.2 % SOLN Apply 1 drop to eye daily as needed (itchy/watery eyes). 2.5 mL 5  . Omega-3 1000 MG CAPS Take 1,000 capsules by mouth daily.    Marland Kitchen oxyCODONE (ROXICODONE) 5 MG immediate release tablet Take 1 tablet (5 mg total) by mouth every 6 (six) hours as needed for severe pain. 20 tablet 0  . predniSONE (DELTASONE) 10 MG tablet Begin prednisone 10 mg tablets. Take 2 tablets twice a day for 3 days, then take 2 tablets once a day for 1 day, then take 1 tablet on the 5th day, then stop 15 tablet 0  . Tiotropium Bromide Monohydrate (SPIRIVA RESPIMAT) 1.25 MCG/ACT AERS Inhale  2 puffs into the lungs daily. 1 each 2   No current facility-administered medications on file prior to visit.    Cardiovascular & other pertient studies:  CTA 05/29/2020: 1. Coronary calcium score of 0. 2. Normal coronary origin with right dominance. 3. CAD-RADS = 2. Mild stenosis in the proximal LAD due to noncalcified plaque.  EKG 04/11/2020: Sinus rhythm 86 bpm  Nonspecific ST-T changes   Treadmill stress test 2016: 10.1 METS, Normal study.   Recent labs: 02/21/2020: Glucose 87, BUN/Cr 13/0.66. EGFR normal. Na/K 140/4.2. Rest of the CMP normal H/H 14/44. MCV 97. Platelets 246 Chol 184, TG 105, HDL 30, LDL 133 TSH 1.87 normal    Review of Systems  Cardiovascular: Positive for chest pain. Negative for dyspnea on exertion, leg  swelling, palpitations and syncope.         Vitals:   06/06/20 1059  BP: 122/76  Pulse: 77  Resp: 16  SpO2: 96%     Body mass index is 24.13 kg/m. Filed Weights   06/06/20 1059  Weight: 145 lb (65.8 kg)     Objective:   Physical Exam Vitals and nursing note reviewed.  Constitutional:      General: She is not in acute distress. Neck:     Vascular: No JVD.  Cardiovascular:     Rate and Rhythm: Normal rate and regular rhythm.     Heart sounds: Normal heart sounds. No murmur heard.   Pulmonary:     Effort: Pulmonary effort is normal.     Breath sounds: Normal breath sounds. No wheezing or rales.  Musculoskeletal:     Comments: Dressing on left forearm/wrist           Assessment & Recommendations:   49 y.o. Caucasian female with Crohn's disease, tobacco dependence, family h/o early CAD, atypical angina  Chest pain: Noncardiac.  She does have noncalcified mild plaque in proximal mid LAD.  Given her risk factors of family history, tobacco dependence, hyperlipidemia, and continued lipid-lowering therapy with Lipitor.  Repeat lipid panel in 3 months.  Given mild plaque, aspirin not necessary.  Tobacco dependence:  Tobacco cessation counseling:  - Currently smoking 1-1.5 packs/day   - Patient was informed of the dangers of tobacco abuse including stroke, cancer, and MI, as well as benefits of tobacco cessation. - Patient is willing to quit at this time. - Approximately 5 mins were spent counseling patient cessation techniques. We discussed various methods to help quit smoking, including deciding on a date to quit, joining a support group, pharmacological agents. Patient would like to quit on her own.. - I will reassess her progress at the next follow-up visit   Nigel Mormon, MD Pager: [phone] Office: [phone]

## 2020-06-20 HISTORY — PX: TYMPANOSTOMY TUBE PLACEMENT: SHX32

## 2020-06-24 ENCOUNTER — Ambulatory Visit (INDEPENDENT_AMBULATORY_CARE_PROVIDER_SITE_OTHER): Payer: Medicare Other | Admitting: Family Medicine

## 2020-06-24 ENCOUNTER — Encounter: Payer: Self-pay | Admitting: Family Medicine

## 2020-06-24 ENCOUNTER — Other Ambulatory Visit: Payer: Self-pay

## 2020-06-24 VITALS — BP 110/68 | HR 79 | Temp 97.8°F | Resp 16

## 2020-06-24 DIAGNOSIS — J302 Other seasonal allergic rhinitis: Secondary | ICD-10-CM | POA: Diagnosis not present

## 2020-06-24 DIAGNOSIS — J449 Chronic obstructive pulmonary disease, unspecified: Secondary | ICD-10-CM | POA: Diagnosis not present

## 2020-06-24 DIAGNOSIS — H1010 Acute atopic conjunctivitis, unspecified eye: Secondary | ICD-10-CM

## 2020-06-24 DIAGNOSIS — H6983 Other specified disorders of Eustachian tube, bilateral: Secondary | ICD-10-CM | POA: Diagnosis not present

## 2020-06-24 DIAGNOSIS — Z720 Tobacco use: Secondary | ICD-10-CM

## 2020-06-24 DIAGNOSIS — J3089 Other allergic rhinitis: Secondary | ICD-10-CM

## 2020-06-24 NOTE — Patient Instructions (Addendum)
Asthma COPD overlap Continue Advair 230 to 2 puffs twice a day with a spacer to prevent cough or wheeze Stop Spiriva 1.25 mcg for right now. Restart Spiriva 1.25 mcg-2 puffs once a day if you begin to cough or wheeze Continue albuterol 2 puffs once every 4 hours as needed for cough or wheeze  Allergic rhinitis Continue carbinoxamine 8 mg twice a day for nasal symptoms Continue Flonase 2 sprays in each nostril once a day for stuffy nose.  In the right nostril, point the applicator out toward the right ear. In the left nostril, point the applicator out toward the left ear Begin saline nasal rinses as needed for nasal symptoms. Use this before any medicated nasal sprays for best result Continue azelastine 2 sprays in each nostril twice a day as needed for a runny nose or sinus headache Continue allergen avoidance measures directed toward pollen, pets, dust mites, and cockroach as listed below For thick post nasal drainage, begin Mucinex (guaifenesin) 1200 mg twice a day and increase hydration in order to this mucus   Allergic conjunctivitis Some over the counter eye drops include Pataday one drop in each eye once a day as needed for red, itchy eyes OR Zaditor one drop in each eye twice a day as needed for red itchy eyes.  Tobacco use Stop smoking or try to cut down on smoking.  Written information provided from UpToDate  Eustachian tube dysfunction Continue to follow up with your ENT specialist, Dr. Jenne Pane as prescribed  Call the clinic if this treatment plan is not working well for you  Follow up in 3 months or sooner if needed.  Reducing Pollen Exposure The American Academy of Allergy, Asthma and Immunology suggests the following steps to reduce your exposure to pollen during allergy seasons. 1. Do not hang sheets or clothing out to dry; pollen may collect on these items. 2. Do not mow lawns or spend time around freshly cut grass; mowing stirs up pollen. 3. Keep windows closed at night.   Keep car windows closed while driving. 4. Minimize morning activities outdoors, a time when pollen counts are usually at their highest. 5. Stay indoors as much as possible when pollen counts or humidity is high and on windy days when pollen tends to remain in the air longer. 6. Use air conditioning when possible.  Many air conditioners have filters that trap the pollen spores. 7. Use a HEPA room air filter to remove pollen form the indoor air you breathe.  Control of Dog or Cat Allergen Avoidance is the best way to manage a dog or cat allergy. If you have a dog or cat and are allergic to dog or cats, consider removing the dog or cat from the home. If you have a dog or cat but don't want to find it a new home, or if your family wants a pet even though someone in the household is allergic, here are some strategies that may help keep symptoms at bay:  8. Keep the pet out of your bedroom and restrict it to only a few rooms. Be advised that keeping the dog or cat in only one room will not limit the allergens to that room. 9. Don't pet, hug or kiss the dog or cat; if you do, wash your hands with soap and water. 10. High-efficiency particulate air (HEPA) cleaners run continuously in a bedroom or living room can reduce allergen levels over time. 11. Regular use of a high-efficiency vacuum cleaner or a central vacuum  can reduce allergen levels. 12. Giving your dog or cat a bath at least once a week can reduce airborne allergen.   Control of Dust Mite Allergen Dust mites play a major role in allergic asthma and rhinitis. They occur in environments with high humidity wherever human skin is found. Dust mites absorb humidity from the atmosphere (ie, they do not drink) and feed on organic matter (including shed human and animal skin). Dust mites are a microscopic type of insect that you cannot see with the naked eye. High levels of dust mites have been detected from mattresses, pillows, carpets, upholstered  furniture, bed covers, clothes, soft toys and any woven material. The principal allergen of the dust mite is found in its feces. A gram of dust may contain 1,000 mites and 250,000 fecal particles. Mite antigen is easily measured in the air during house cleaning activities. Dust mites do not bite and do not cause harm to humans, other than by triggering allergies/asthma.  Ways to decrease your exposure to dust mites in your home:  1. Encase mattresses, box springs and pillows with a mite-impermeable barrier or cover  2. Wash sheets, blankets and drapes weekly in hot water (130 F) with detergent and dry them in a dryer on the hot setting.  3. Have the room cleaned frequently with a vacuum cleaner and a damp dust-mop. For carpeting or rugs, vacuuming with a vacuum cleaner equipped with a high-efficiency particulate air (HEPA) filter. The dust mite allergic individual should not be in a room which is being cleaned and should wait 1 hour after cleaning before going into the room.  4. Do not sleep on upholstered furniture (eg, couches).  5. If possible removing carpeting, upholstered furniture and drapery from the home is ideal. Horizontal blinds should be eliminated in the rooms where the person spends the most time (bedroom, study, television room). Washable vinyl, roller-type shades are optimal.  6. Remove all non-washable stuffed toys from the bedroom. Wash stuffed toys weekly like sheets and blankets above.  7. Reduce indoor humidity to less than 50%. Inexpensive humidity monitors can be purchased at most hardware stores. Do not use a humidifier as can make the problem worse and are not recommended.  Control of Cockroach Allergen  Cockroach allergen has been identified as an important cause of acute attacks of asthma, especially in urban settings.  There are fifty-five species of cockroach that exist in the [place], however only three, the [place], [place] species produce  allergen that can affect patients with Asthma.  Allergens can be obtained from fecal particles, egg casings and secretions from cockroaches.    1. Remove food sources. 2. Reduce access to water. 3. Seal access and entry points. 4. Spray runways with 0.5-1% Diazinon or Chlorpyrifos 5. Blow boric acid power under stoves and refrigerator. 6. Place bait stations (hydramethylnon) at feeding sites.

## 2020-06-24 NOTE — Progress Notes (Addendum)
[address] [place] [address] Dept: [phone]  FOLLOW UP NOTE  Patient ID: Bailey Mcdaniel, female    DOB: 06/03/1971  Age: 49 y.o. MRN: [id number] Date of Office Visit: 06/24/2020  Assessment  Chief Complaint: Allergies and Pruritis (eyes)  HPI Bailey Mcdaniel is a 49 year old female who presents to the clinic for follow-up visit.  She was last seen in this clinic via televisit on 05/02/2020 for evaluation of asthma COPD overlap, allergic rhinitis, allergic conjunctivitis, tobacco use, and eustachian tube dysfunction.  In the interim, she has visited Dr. Jenne Pane, ear nose and throat specialist, for bilateral myringotomy with tube placement on 06/20/2020.  At today's visit she reports her asthma has been moderately well controlled with no shortness of breath or wheeze with activity or rest.  She does report a dry cough occurring several days a week.  She reports that she discontinued her asthma medications on last week Thursday, 5 days ago.  Prior to that she was using Advair 230-2 puffs twice a day, Spiriva 1.25 mcg - 2 puffs once a day, and albuterol about once a week.  Allergic rhinitis is reported as moderately well controlled with the symptoms including clear rhinorrhea.  She continues carbinoxamine 8 mg twice a day, Flonase daily, and azelastine daily.  She is not currently using a saline nasal rinse or Mucinex.  She continues to smoke cigarettes one half a pack per day.  She denies reflux including heartburn and vomiting.  Her current medications are listed in the chart.   Drug Allergies:  No Known Allergies  Physical Exam: BP 110/68   Pulse 79   Temp 97.8 F (36.6 C) (Temporal)   Resp 16   SpO2 100%    Physical Exam Vitals reviewed.  Constitutional:      Appearance: Normal appearance.  HENT:     Head: Normocephalic and atraumatic.     Right Ear: Tympanic membrane normal.     Left Ear: Tympanic membrane normal.     Nose:     Comments: Bilateral nares  slightly erythematous with clear nasal drainage noted.  Pharynx normal.  Ears normal.  Eyes normal.    Mouth/Throat:     Pharynx: Oropharynx is clear.  Eyes:     Conjunctiva/sclera: Conjunctivae normal.  Cardiovascular:     Rate and Rhythm: Normal rate and regular rhythm.     Heart sounds: Normal heart sounds. No murmur heard.   Pulmonary:     Effort: Pulmonary effort is normal.     Breath sounds: Normal breath sounds.     Comments: Lungs clear to auscultation Abdominal:     Tenderness: There is no left CVA tenderness.  Musculoskeletal:        General: Normal range of motion.     Cervical back: Normal range of motion and neck supple.  Skin:    General: Skin is warm and dry.  Neurological:     Mental Status: She is alert and oriented to person, place, and time.  Psychiatric:        Mood and Affect: Mood normal.        Behavior: Behavior normal.        Thought Content: Thought content normal.        Judgment: Judgment normal.    Diagnostics: FVC 3.33, FEV1 2.61.  Predicted FVC 3.71, predicted FEV1 2.96.  Spirometry indicates normal ventilatory function.  Assessment and Plan: 1. Asthma-COPD overlap syndrome (HCC)   2. Seasonal and perennial allergic rhinoconjunctivitis  3. Tobacco user   4. Dysfunction of both eustachian tubes   5. Seasonal allergic conjunctivitis     Patient Instructions  Asthma COPD overlap Continue Advair 230 to 2 puffs twice a day with a spacer to prevent cough or wheeze Stop Spiriva 1.25 mcg for right now. Restart Spiriva 1.25 mcg-2 puffs once a day if you begin to cough or wheeze Continue albuterol 2 puffs once every 4 hours as needed for cough or wheeze  Allergic rhinitis Continue carbinoxamine 8 mg twice a day for nasal symptoms Continue Flonase 2 sprays in each nostril once a day for stuffy nose.  In the right nostril, point the applicator out toward the right ear. In the left nostril, point the applicator out toward the left ear Begin saline  nasal rinses as needed for nasal symptoms. Use this before any medicated nasal sprays for best result Continue azelastine 2 sprays in each nostril twice a day as needed for a runny nose or sinus headache Continue allergen avoidance measures directed toward pollen, pets, dust mites, and cockroach as listed below For thick post nasal drainage, begin Mucinex (guaifenesin) 1200 mg twice a day and increase hydration in order to this mucus   Allergic conjunctivitis Some over the counter eye drops include Pataday one drop in each eye once a day as needed for red, itchy eyes OR Zaditor one drop in each eye twice a day as needed for red itchy eyes.  Tobacco use Stop smoking or try to cut down on smoking.  Written information provided from UpToDate  Eustachian tube dysfunction Continue to follow up with your ENT specialist, Dr. Jenne Pane as prescribed  Call the clinic if this treatment plan is not working well for you  Follow up in 3 months or sooner if needed.   Return in about 3 months (around 09/24/2020), or if symptoms worsen or fail to improve.    Thank you for the opportunity to care for this patient.  Please do not hesitate to contact me with questions.  Thermon Leyland, FNP [facility]  ________________________________________________  I have provided oversight concerning [name]'s evaluation and treatment of this patient's health issues addressed during today's encounter.  I agree with the assessment and therapeutic plan as outlined in the note.   Signed,   R Jorene Guest, MD

## 2020-06-25 NOTE — Unmapped (Signed)
[facility] Refill Coordination Note    Specialty Medication(s) to be Shipped:   Inflammatory Disorders: Humira    Other medication(s) to be shipped: No additional medications requested for fill at this time     Krista Williamson, DOB: [birth year]  Phone: [phone] (home)       All above HIPAA information was verified with patient.     Was a Nurse, learning disability used for this call? No    Completed refill call assessment today to schedule patient's medication shipment from the [facility] [phone]).       Specialty medication(s) and dose(s) confirmed: Regimen is correct and unchanged.   Changes to medications: Krista Williamson reports no changes at this time.  Changes to insurance: No  Questions for the pharmacist: No    Confirmed patient received Welcome Packet with first shipment. The patient will receive a drug information handout for each medication shipped and additional FDA Medication Guides as required.       DISEASE/MEDICATION-SPECIFIC INFORMATION        For patients on injectable medications: Patient currently has 1 doses left.  Next injection is scheduled for 06/27/2020.    SPECIALTY MEDICATION ADHERENCE     Medication Adherence    Patient reported X missed doses in the last month: 0  Specialty Medication: Humira CF 40 mg/0.4 ml  Patient is on additional specialty medications: No  Any gaps in refill history greater than 2 weeks in the last 3 months: no  Demonstrates understanding of importance of adherence: yes  Informant: patient  Reliability of informant: reliable  Confirmed plan for next specialty medication refill: delivery by pharmacy  Refills needed for supportive medications: not needed                      SHIPPING     Shipping address confirmed in Epic.     Delivery Scheduled: Yes, Expected medication delivery date: 07/02/2020.     Medication will be delivered via UPS to the prescription address in Epic WAM.    Krista Williamson   [facility] Specialty Technician

## 2020-07-01 MED FILL — HUMIRA PEN CITRATE FREE 40 MG/0.4 ML: SUBCUTANEOUS | 28 days supply | Qty: 8 | Fill #4

## 2020-07-01 MED FILL — HUMIRA PEN CITRATE FREE 40 MG/0.4 ML: 28 days supply | Qty: 8 | Fill #4 | Status: AC

## 2020-07-22 DIAGNOSIS — L732 Hidradenitis suppurativa: Principal | ICD-10-CM

## 2020-07-22 MED ORDER — HUMIRA PEN CITRATE FREE 40 MG/0.4 ML
SUBCUTANEOUS | 6 refills | 28.00000 days
Start: 2020-07-22 — End: ?

## 2020-07-22 NOTE — Unmapped (Signed)
Called patient to schd an appt for medication refill was not able to LVM.Marland Kitchen VM was fill..     Thanks  [employer]

## 2020-07-22 NOTE — Unmapped (Signed)
Refill request received for humira. Pt was last seen 04/19/19 and would need to schedule an appointment. Thanks

## 2020-07-25 NOTE — Unmapped (Signed)
Refill request for Humira was denied for reason Patient needs an appointment. Clinic called but was unable to leave message due to vm being full.

## 2020-08-13 DIAGNOSIS — L732 Hidradenitis suppurativa: Principal | ICD-10-CM

## 2020-08-14 ENCOUNTER — Ambulatory Visit: Admit: 2020-08-14 | Discharge: 2020-08-15 | Payer: MEDICARE

## 2020-08-14 DIAGNOSIS — Z79899 Other long term (current) drug therapy: Principal | ICD-10-CM

## 2020-08-14 DIAGNOSIS — N6001 Solitary cyst of right breast: Principal | ICD-10-CM

## 2020-08-14 DIAGNOSIS — L732 Hidradenitis suppurativa: Principal | ICD-10-CM

## 2020-08-14 DIAGNOSIS — L821 Other seborrheic keratosis: Principal | ICD-10-CM

## 2020-08-14 MED ORDER — RIFAMPIN 300 MG CAPSULE
ORAL_CAPSULE | Freq: Two times a day (BID) | ORAL | 2 refills | 30.00000 days | Status: CP
Start: 2020-08-14 — End: ?

## 2020-08-14 MED ORDER — CLINDAMYCIN HCL 300 MG CAPSULE
ORAL_CAPSULE | 2 refills | 0.00000 days | Status: CP
Start: 2020-08-14 — End: ?

## 2020-08-14 NOTE — Unmapped (Addendum)
It was nice to see you today! Your resident physician was Dr. Eustace Quail     Take clindamycin/rifampin twice a day for flares.  Take humira 80mg  weekly. Get TB test today.    If any of your medications are too expensive, look for a coupon at [email or web address] or reference the application on a smartphone  - Enter the medication name, size, and your zip code to find coupons for local pharmacies.   - Print a coupon and bring it to the pharmacy, or pull up the coupon on a smartphone.  - You can also call pharmacies to ask about the cost of your medication before you pick it up.  If you still cannot afford your medication, please let us know.     Please call our clinic at [phone] with any concerns or to schedule a follow up appointment. We look forward to seeing you again!    [facility] releases most results to you as soon as they are available. Therefore, you may see some results before we do. Please give [place] 2 business days to review the tests and contact you by phone or through MyChart. If you are concerned that some results may be upsetting or confusing, you may wish to wait until we contact you before looking at the report in MyChart. If you have an urgent question, you can send [place] a message or call our clinic. Otherwise, we prefer that you wait 2 business days for [place] to contact you.

## 2020-08-14 NOTE — Unmapped (Signed)
Dermatology Note     Assessment and Plan:      Hidradenitis suppurativa  - Diagnosis, natural course, and treatment options were discussed with the patient.   - Will plan to continue humira 80mg  weekly if TB test negative.  - Use clindamycin/rifampin BID for flares.    High risk medication use, humira  - Due for quant gold today, order placed.  - Once this results, will refill Humira and alert Krista Williamson.    Cyst of right nipple  - Discussed that this looks like a benign cyst that expresses keratinous material when manipulated. It is likely associated with the glands of montgomery. Patient can take her clinda/rifampin as needed for this.  - This does not appear to be coming from the lactiferous ducts. There is no serosanguinous fluid or straw-colored fluid that drains when the breast is manipulated. No obvious breast masses or overlying cutaneous changes to suggest malignancy. Patient should continue following with her PCP/OBGYN and stay up to date on mammograms.    Benign Lesions/ Findings:   Seborrheic Keratosis(es) - no irritation noted (right lower breast)  - Reassurance provided regarding the benign appearance of lesions noted on exam today; no treatment is indicated in the absence of symptoms/changes.    The patient was advised to call for an appointment should any new, changing, or symptomatic lesions develop.     RTC: Return in about 1 year (around 08/14/2021) for HS. or sooner as needed   _________________________________________________________________      Chief Complaint     Chief Complaint   Patient presents with   ??? Follow-up     breast and groin        HPI     Krista Williamson is a 50 y.o. female who presents as a returning patient (last seen 04/19/2019) to [facility] for follow up of HS. At last appt, patient was continued on humira 80mg  weekly. She also had an I&D of R nipple. That lesion recurred about 3 months later. She flares every 2-3 months in the groin. She also has had an area on the nipple that when squeezed it expresses discharge. No significant growth or change in behavior for over a year.  She also asks about a small tan bump on the right lower breast and whether this is associated w/ her HS. She has had annual mammograms that have been normal. She has occasional sweats, but no weight loss or unexplained fevers/chills.    The patient denies any other new or changing lesions or areas of concern.     Pertinent Past Medical History     HS  Disease course:  Year when symptoms first noticed: 31  Year of diagnosis: 1999  Location of first symptoms: groin and buttocks  Typical involved areas include: groin and buttocks  Typical number of inflammatory lesions each month at baseline (from first visit): 1-3  Disease triggers: none noted  ??  Prior treatments:  Topical: none  Systemic: adalimumab 80mg  weekly (increased spring 2019), clinda/rifampin, Augmentin, Keflex, doxycycline, Bactrim  Past surgical procedures: unroofing procedure in the bilateral inguinal folds in 04/2018 with excellent improvement in those areas.   Past laser procedures: none    Past Medical History, Family History, Social History, Medication List, Allergies, and Problem List were reviewed in the rooming section of Epic.     ROS: Other than symptoms mentioned in the HPI, no fevers, chills, or other skin complaints    Physical Examination     GENERAL: Well-appearing  female in no acute distress, resting comfortably.  NEURO: Alert and oriented, answers questions appropriately  PSYCH: Normal mood and affect  RESP: No increased work of breathing  SKIN: Examination of the chest was performed, patient deferred groin exam due to absence of activity today  - Small cyst on the right nipple that expresses white thick, keratinous discharge when manipulated  - Faint hypotrophic and hypopigmented scar on the right breast at site of previous cyst  - Hyperkeratotic, well-demarcated, stuck-on pigmented papule located on the breasts and chest    All areas not commented on are within normal limits or unremarkable      (Approved Template 04/15/2020)

## 2020-08-15 DIAGNOSIS — L732 Hidradenitis suppurativa: Principal | ICD-10-CM

## 2020-08-15 LAB — TB AG1: TB AG1 VALUE: 0.22

## 2020-08-15 LAB — TB NIL: TB NIL VALUE: 0.2

## 2020-08-15 LAB — QUANTIFERON TB GOLD PLUS
QUANTIFERON ANTIGEN 1 MINUS NIL: 0.02 [IU]/mL
QUANTIFERON ANTIGEN 2 MINUS NIL: -0.01 [IU]/mL
QUANTIFERON MITOGEN: 9.8 [IU]/mL
QUANTIFERON TB GOLD PLUS: NEGATIVE
QUANTIFERON TB NIL VALUE: 0.2 [IU]/mL

## 2020-08-15 LAB — TB AG2: TB AG2 VALUE: 0.19

## 2020-08-15 LAB — TB MITOGEN: TB MITOGEN VALUE: 10

## 2020-08-15 MED ORDER — HUMIRA PEN CITRATE FREE 40 MG/0.4 ML
SUBCUTANEOUS | 11 refills | 28.00000 days | Status: CP
Start: 2020-08-15 — End: 2020-08-15
  Filled 2020-09-02: qty 8, 28d supply, fill #0

## 2020-08-15 NOTE — Unmapped (Signed)
I saw and evaluated the patient, participating in the key portions of the service.  I reviewed the resident’s note.  I agree with the resident’s findings and plan. Topton Brunker J Madelin Weseman, MD

## 2020-08-19 DIAGNOSIS — L732 Hidradenitis suppurativa: Principal | ICD-10-CM

## 2020-08-29 NOTE — Unmapped (Signed)
[facility] Refill Coordination Note    Specialty Medication(s) to be Shipped:   Inflammatory Disorders: Humira    Other medication(s) to be shipped: No additional medications requested for fill at this time     Krista Williamson, DOB: 12/20/1970  Phone: [phone] (home)       All above HIPAA information was verified with patient.     Was a Nurse, learning disability used for this call? No    Completed refill call assessment today to schedule patient's medication shipment from the [facility] [phone]).       Specialty medication(s) and dose(s) confirmed: Regimen is correct and unchanged.   Changes to medications: Krista Williamson reports no changes at this time.  Changes to insurance: No  Questions for the pharmacist: No    Confirmed patient received Welcome Packet with first shipment. The patient will receive a drug information handout for each medication shipped and additional FDA Medication Guides as required.       DISEASE/MEDICATION-SPECIFIC INFORMATION        For patients on injectable medications: Patient currently has 0 doses left.  Next injection is scheduled for 09/03/2020 (pt missed last week dose due to needing an appointment for further refills).    SPECIALTY MEDICATION ADHERENCE     Medication Adherence    Patient reported X missed doses in the last month: 1  Specialty Medication: Humira CF 40 mg/0.4 ml  Patient is on additional specialty medications: No  Any gaps in refill history greater than 2 weeks in the last 3 months: no  Demonstrates understanding of importance of adherence: yes  Informant: patient  Reliability of informant: reliable  Confirmed plan for next specialty medication refill: delivery by pharmacy  Refills needed for supportive medications: not needed                      SHIPPING     Shipping address confirmed in Epic.     Delivery Scheduled: Yes, Expected medication delivery date: 09/03/2020.     Medication will be delivered via UPS to the prescription address in Epic WAM.    [name]   [facility] Specialty Technician

## 2020-09-05 ENCOUNTER — Other Ambulatory Visit: Payer: Self-pay

## 2020-09-05 ENCOUNTER — Ambulatory Visit: Payer: Medicare Other | Admitting: Cardiology

## 2020-09-05 ENCOUNTER — Encounter: Payer: Self-pay | Admitting: Cardiology

## 2020-09-05 VITALS — BP 116/76 | HR 89 | Temp 97.2°F | Resp 16 | Ht 65.0 in | Wt 146.0 lb

## 2020-09-05 DIAGNOSIS — F17200 Nicotine dependence, unspecified, uncomplicated: Secondary | ICD-10-CM

## 2020-09-05 DIAGNOSIS — E782 Mixed hyperlipidemia: Secondary | ICD-10-CM

## 2020-09-05 NOTE — Progress Notes (Signed)
Follow up visit  Subjective:   Bailey Mcdaniel, female    DOB: November 04, 1970, 50 y.o.   MRN: [id number]   HPI  Chief Complaint  Patient presents with  . Chest Pain  . Follow-up  . Results    50 y.o. Caucasian female with Crohn's disease, tobacco dependence, family h/o early CAD, atypical angina    Coronary CT angiogram that showed mild noncalcified plaque with calcium score of 0. She doe snot have any chest pan at this time. Unfortunately, she continues to smoke anywhere from 5 to occasionally up to 20 cigarettes. She just underwent lipid panel testing, results not available to me.     Current Outpatient Medications on File Prior to Visit  Medication Sig Dispense Refill  . Adalimumab (HUMIRA) 40 MG/0.8ML PSKT Inject 40 mg into the skin 2 (two) times a week.     Marland Kitchen albuterol (VENTOLIN HFA) 108 (90 Base) MCG/ACT inhaler Inhale 2 puffs into the lungs every 4 (four) hours as needed for wheezing or shortness of breath. 18 g 2  . atorvastatin (LIPITOR) 20 MG tablet Take 1 tablet (20 mg total) by mouth daily. 90 tablet 2  . azelastine (ASTELIN) 0.1 % nasal spray Take 1-2 sprays per nostril twice a day as needed for drainage. (Patient not taking: Reported on 06/24/2020) 30 mL 5  . b complex vitamins tablet Take 1 tablet by mouth daily.     . Carbinoxamine Maleate 4 MG TABS Take 2 tablets (8 mg total) by mouth in the morning and at bedtime. 90 tablet 0  . Cholecalciferol (PA VITAMIN D-3) 50 MCG (2000 UT) CAPS Take 2,000 Units by mouth daily.     . clonazePAM (KLONOPIN) 1 MG tablet Take 1 mg by mouth daily.     . fluticasone (FLONASE) 50 MCG/ACT nasal spray  (Patient not taking: Reported on 06/24/2020)    . fluticasone (FLONASE) 50 MCG/ACT nasal spray Place 2 sprays into both nostrils 2 (two) times daily as needed for allergies or rhinitis. (Patient not taking: Reported on 06/24/2020) 1 g 1  . levocetirizine (XYZAL) 5 MG tablet Take 1 tablet (5 mg total) by mouth every evening. 30 tablet 5   . metoprolol tartrate (LOPRESSOR) 25 MG tablet Take 1 tablet (25 mg total) by mouth 2 (two) times daily. 60 tablet 3  . Multiple Vitamins-Minerals (EMERGEN-C IMMUNE) PACK Take 1 packet by mouth daily.    . naproxen (NAPROSYN) 500 MG tablet Take 1 tablet (500 mg total) by mouth 2 (two) times daily. 20 tablet 0  . nitroGLYCERIN (NITROSTAT) 0.4 MG SL tablet Place 1 tablet (0.4 mg total) under the tongue every 5 (five) minutes as needed for chest pain. 30 tablet 3  . Olopatadine HCl 0.2 % SOLN Apply 1 drop to eye daily as needed (itchy/watery eyes). (Patient not taking: Reported on 06/24/2020) 2.5 mL 5  . Omega-3 1000 MG CAPS Take 1,000 capsules by mouth daily.     Marland Kitchen oxyCODONE (ROXICODONE) 5 MG immediate release tablet Take 1 tablet (5 mg total) by mouth every 6 (six) hours as needed for severe pain. (Patient not taking: Reported on 06/24/2020) 20 tablet 0  . Tiotropium Bromide Monohydrate (SPIRIVA RESPIMAT) 1.25 MCG/ACT AERS Inhale 2 puffs into the lungs daily. 1 each 2   No current facility-administered medications on file prior to visit.    Cardiovascular & other pertient studies:  CTA 05/29/2020: 1. Coronary calcium score of 0. 2. Normal coronary origin with right dominance. 3. CAD-RADS = 2. Mild  stenosis in the proximal LAD due to noncalcified plaque.  EKG 04/11/2020: Sinus rhythm 86 bpm  Nonspecific ST-T changes   Treadmill stress test 2016: 10.1 METS, Normal study.   Recent labs: 02/21/2020: Glucose 87, BUN/Cr 13/0.66. EGFR normal. Na/K 140/4.2. Rest of the CMP normal H/H 14/44. MCV 97. Platelets 246 Chol 184, TG 105, HDL 30, LDL 133 TSH 1.87 normal    Review of Systems  Cardiovascular: Positive for chest pain. Negative for dyspnea on exertion, leg swelling, palpitations and syncope.         Vitals:   09/05/20 0934  BP: 116/76  Pulse: 89  Resp: 16  Temp: (!) 97.2 F (36.2 C)  SpO2: 95%     Body mass index is 24.3 kg/m. Filed Weights   09/05/20 0934   Weight: 146 lb (66.2 kg)     Objective:   Physical Exam Vitals and nursing note reviewed.  Constitutional:      General: She is not in acute distress. Neck:     Vascular: No JVD.  Cardiovascular:     Rate and Rhythm: Normal rate and regular rhythm.     Heart sounds: Normal heart sounds. No murmur heard.   Pulmonary:     Effort: Pulmonary effort is normal.     Breath sounds: Normal breath sounds. No wheezing or rales.  Musculoskeletal:     Comments: Dressing on left forearm/wrist           Assessment & Recommendations:   50 y.o. Caucasian female with Crohn's disease, tobacco dependence, family h/o early CAD, atypical angina  Chest pain: Noncardiac.  She does have noncalcified mild plaque in proximal mid LAD.  Given her risk factors of family history, tobacco dependence, hyperlipidemia, and continued lipid-lowering therapy with Lipitor.  Repeat lipid panel results pending. Given mild plaque, aspirin not necessary.  Tobacco dependence:  Re-emphasized importance of tobacco cessation.  Continue f/u w/PCP. I will see her on as needed basis    Nigel Mormon, MD Pager: [phone] Office: [phone]

## 2020-09-06 LAB — BASIC METABOLIC PANEL WITH GFR
BUN/Creatinine Ratio: 11 (ref 9–23)
BUN: 7 mg/dL (ref 6–24)
CO2: 23 mmol/L (ref 20–29)
Calcium: 9.4 mg/dL (ref 8.7–10.2)
Chloride: 102 mmol/L (ref 96–106)
Creatinine, Ser: 0.66 mg/dL (ref 0.57–1.00)
GFR calc Af Amer: 121 mL/min/1.73
GFR calc non Af Amer: 105 mL/min/1.73
Glucose: 86 mg/dL (ref 65–99)
Potassium: 4.2 mmol/L (ref 3.5–5.2)
Sodium: 141 mmol/L (ref 134–144)

## 2020-09-06 NOTE — Progress Notes (Signed)
Can you check with [facility], if - they have a pending lipid panel order - if not, can they add it on to this collection from yesterday?  Thanks MJP

## 2020-09-20 NOTE — Progress Notes (Signed)
Lipid was not done because order is in as a future order. [facility] can not see these orders. I see pt was made prn, should I call her to have lipid done? Please advise, thanks.

## 2020-09-20 NOTE — Addendum Note (Signed)
Addended by: Elder Negus on: 09/20/2020 04:05 PM   Modules accepted: Orders

## 2020-09-20 NOTE — Progress Notes (Signed)
Yes please. I will release the order.  Thanks MJP

## 2020-09-20 NOTE — Unmapped (Signed)
[facility] Refill Coordination Note    Specialty Medication(s) to be Shipped:   Inflammatory Disorders: Humira    Other medication(s) to be shipped: No additional medications requested for fill at this time     Krista Williamson, DOB: [birth year]  Phone: [phone] (home)       All above HIPAA information was verified with patient.     Was a Nurse, learning disability used for this call? No    Completed refill call assessment today to schedule patient's medication shipment from the [facility] [phone]).       Specialty medication(s) and dose(s) confirmed: Regimen is correct and unchanged.   Changes to medications: Asianna reports no changes at this time.  Changes to insurance: No  Questions for the pharmacist: No    Confirmed patient received Welcome Packet with first shipment. The patient will receive a drug information handout for each medication shipped and additional FDA Medication Guides as required.       DISEASE/MEDICATION-SPECIFIC INFORMATION        For patients on injectable medications: Patient currently has 1 doses left.  Next injection is scheduled for 09/24/2020 .    SPECIALTY MEDICATION ADHERENCE     Medication Adherence    Patient reported X missed doses in the last month: 0  Specialty Medication: Humira CF 40 mg/0.4 ml  Patient is on additional specialty medications: No  Any gaps in refill history greater than 2 weeks in the last 3 months: no  Demonstrates understanding of importance of adherence: yes  Informant: patient  Reliability of informant: reliable  Confirmed plan for next specialty medication refill: delivery by pharmacy  Refills needed for supportive medications: not needed                      SHIPPING     Shipping address confirmed in Epic.     Delivery Scheduled: Yes, Expected medication delivery date: 09/24/2020.     Medication will be delivered via UPS to the prescription address in Epic WAM.    Leyan Branden D Kazden Largo   [facility] Specialty Technician

## 2020-09-23 DIAGNOSIS — L732 Hidradenitis suppurativa: Principal | ICD-10-CM

## 2020-09-23 NOTE — Progress Notes (Signed)
[address] [place] [address] Dept: [phone]  FOLLOW UP NOTE  Patient ID: Bailey Mcdaniel, female    DOB: March 11, 1971  Age: 50 y.o. MRN: [id number] Date of Office Visit: 09/24/2020  Assessment  Chief Complaint: Allergic Rhinitis  and Asthma  HPI Bailey Mcdaniel is a 50 year old female who presents to the clinic for follow-up visit.  She was last seen in this clinic on 06/24/2020 for evaluation of asthma COPD overlap syndrome, allergic rhinitis, allergic conjunctivitis, tobacco use, and eustachian tube dysfunction.  She has recently had tympanostomy tubes placed on 06/10/2020 with Dr. Jenne Pane.  At today's visit she reports her asthma has been moderately well controlled with shortness of breath occurring with activity, wheezing occurred 1 time about 1 month ago, and she does experience dry cough when exposed to allergy triggers such as going outside or being in the vicinity of dogs.  She continues Advair 230-2 times over the last month and albuterol about 2-3 times a month with relief of symptoms.  She has not needed Spiriva since her last visit to this clinic.  Allergic rhinitis is reported as moderately well controlled with occasional clear rhinorrhea, occasional postnasal drainage, and recent nasal itching and throat itching over the last 2 weeks.  She continues carbinoxamine, Flonase, azelastine, and saline nasal rinses as needed with relief of symptoms.  Allergic conjunctivitis is reported as well controlled with olopatadine as needed.  She continues to smoke about 1 pack of cigarettes a day and is currently trying to cut down.  Eustachian tube dysfunction is reported as well controlled after the insertion of tympanostomy tubes in November.  Her current medications are listed in the chart.   Drug Allergies:  No Known Allergies  Physical Exam: BP 106/66 (BP Location: Right Arm, Patient Position: Sitting, Cuff Size: Normal)   Pulse 77   Temp 98.2 F (36.8 C) (Temporal)    Resp 16   Ht 5\' 3"  (1.6 m)   Wt 142 lb 12.8 oz (64.8 kg)   SpO2 100%   BMI 25.30 kg/m    Physical Exam Vitals reviewed.  Constitutional:      Appearance: Normal appearance.  HENT:     Head: Normocephalic and atraumatic.     Right Ear: Tympanic membrane normal.     Left Ear: Tympanic membrane normal.     Nose:     Comments: Bilateral nares slightly erythematous with clear nasal drainage noted.  Pharynx slightly erythematous with no exudate.  Ears normal.  Eyes normal. Eyes:     Conjunctiva/sclera: Conjunctivae normal.  Cardiovascular:     Rate and Rhythm: Normal rate and regular rhythm.     Heart sounds: Normal heart sounds. No murmur heard.   Pulmonary:     Effort: Pulmonary effort is normal.     Breath sounds: Normal breath sounds.     Comments: Lungs clear to auscultation Musculoskeletal:        General: Normal range of motion.     Cervical back: Normal range of motion and neck supple.  Skin:    General: Skin is warm and dry.  Neurological:     Mental Status: She is alert and oriented to person, place, and time.  Psychiatric:        Mood and Affect: Mood normal.        Behavior: Behavior normal.        Thought Content: Thought content normal.        Judgment: Judgment normal.  Diagnostics: FVC 3.16, FEV1 2.58.  Predicted FVC 3.45, predicted FEV1 2.76.  Spirometry indicates normal ventilatory function.  Assessment and Plan: 1. Asthma-COPD overlap syndrome (HCC)   2. Seasonal and perennial allergic rhinitis   3. Seasonal allergic conjunctivitis   4. Dysfunction of both eustachian tubes   5. Tobacco user     Meds ordered this encounter  Medications  . albuterol (VENTOLIN HFA) 108 (90 Base) MCG/ACT inhaler    Sig: Inhale 2 puffs into the lungs every 4 (four) hours as needed for wheezing or shortness of breath.    Dispense:  18 g    Refill:  1  . Carbinoxamine Maleate 4 MG TABS    Sig: Take 2 tablets (8 mg total) by mouth in the morning and at bedtime.     Dispense:  90 tablet    Refill:  5  . fluticasone-salmeterol (ADVAIR HFA) 230-21 MCG/ACT inhaler    Sig: Inhale 2 puffs into the lungs 2 (two) times daily.    Dispense:  1 each    Refill:  5    Patient Instructions  Asthma COPD overlap Continue albuterol 2 puffs once every 4 hours as needed for cough or wheeze You may use albuterol 2 puffs 5-15 minutes before activity to decrease cough or wheeze For asthma flare, begin Advair 230-2 puffs twice a day with a spacer for 2 weeks or until cough and wheeze free. Call the clinic if you need to start this  Allergic rhinitis Continue carbinoxamine 8 mg twice a day for nasal symptoms Continue Flonase 2 sprays in each nostril once a day for stuffy nose.  In the right nostril, point the applicator out toward the right ear. In the left nostril, point the applicator out toward the left ear Begin saline nasal rinses as needed for nasal symptoms. Use this before any medicated nasal sprays for best result Continue azelastine 2 sprays in each nostril twice a day as needed for a runny nose or sinus headache Continue allergen avoidance measures directed toward pollen, pets, dust mites, and cockroach as listed below For thick post nasal drainage, begin Mucinex (guaifenesin) 1200 mg twice a day and increase hydration in order to this mucus   Allergic conjunctivitis Some over the counter eye drops include Pataday one drop in each eye once a day as needed for red, itchy eyes OR Zaditor one drop in each eye twice a day as needed for red itchy eyes.  Tobacco use Stop smoking or try to cut down on smoking.  Written information provided from [employer]  Eustachian tube dysfunction Tympanostomy tubes remain in place Continue to follow up with your ENT specialist, Dr. Jenne Pane as prescribed  Call the clinic if this treatment plan is not working well for you  Follow up in 6 months or sooner if needed.   Return in about 6 months (around 03/24/2021), or if symptoms  worsen or fail to improve.    Thank you for the opportunity to care for this patient.  Please do not hesitate to contact me with questions.  Thermon Leyland, FNP [facility]

## 2020-09-23 NOTE — Patient Instructions (Addendum)
Asthma COPD overlap Continue albuterol 2 puffs once every 4 hours as needed for cough or wheeze You may use albuterol 2 puffs 5-15 minutes before activity to decrease cough or wheeze For asthma flare, begin Advair 230-2 puffs twice a day with a spacer for 2 weeks or until cough and wheeze free. Call the clinic if you need to start this  Allergic rhinitis Continue carbinoxamine 8 mg twice a day for nasal symptoms Continue Flonase 2 sprays in each nostril once a day for stuffy nose.  In the right nostril, point the applicator out toward the right ear. In the left nostril, point the applicator out toward the left ear Begin saline nasal rinses as needed for nasal symptoms. Use this before any medicated nasal sprays for best result Continue azelastine 2 sprays in each nostril twice a day as needed for a runny nose or sinus headache Continue allergen avoidance measures directed toward pollen, pets, dust mites, and cockroach as listed below For thick post nasal drainage, begin Mucinex (guaifenesin) 1200 mg twice a day and increase hydration in order to this mucus   Allergic conjunctivitis Some over the counter eye drops include Pataday one drop in each eye once a day as needed for red, itchy eyes OR Zaditor one drop in each eye twice a day as needed for red itchy eyes.  Tobacco use Stop smoking or try to cut down on smoking.  Written information provided from UpToDate  Eustachian tube dysfunction Tympanostomy tubes remain in place Continue to follow up with your ENT specialist, Dr. Jenne Pane as prescribed  Call the clinic if this treatment plan is not working well for you  Follow up in 6 months or sooner if needed.  Reducing Pollen Exposure The American Academy of Allergy, Asthma and Immunology suggests the following steps to reduce your exposure to pollen during allergy seasons. 1. Do not hang sheets or clothing out to dry; pollen may collect on these items. 2. Do not mow lawns or spend time  around freshly cut grass; mowing stirs up pollen. 3. Keep windows closed at night.  Keep car windows closed while driving. 4. Minimize morning activities outdoors, a time when pollen counts are usually at their highest. 5. Stay indoors as much as possible when pollen counts or humidity is high and on windy days when pollen tends to remain in the air longer. 6. Use air conditioning when possible.  Many air conditioners have filters that trap the pollen spores. 7. Use a HEPA room air filter to remove pollen form the indoor air you breathe.  Control of Dog or Cat Allergen Avoidance is the best way to manage a dog or cat allergy. If you have a dog or cat and are allergic to dog or cats, consider removing the dog or cat from the home. If you have a dog or cat but don't want to find it a new home, or if your family wants a pet even though someone in the household is allergic, here are some strategies that may help keep symptoms at bay:  8. Keep the pet out of your bedroom and restrict it to only a few rooms. Be advised that keeping the dog or cat in only one room will not limit the allergens to that room. 9. Don't pet, hug or kiss the dog or cat; if you do, wash your hands with soap and water. 10. High-efficiency particulate air (HEPA) cleaners run continuously in a bedroom or living room can reduce allergen levels over time.  11. Regular use of a high-efficiency vacuum cleaner or a central vacuum can reduce allergen levels. 12. Giving your dog or cat a bath at least once a week can reduce airborne allergen.   Control of Dust Mite Allergen Dust mites play a major role in allergic asthma and rhinitis. They occur in environments with high humidity wherever human skin is found. Dust mites absorb humidity from the atmosphere (ie, they do not drink) and feed on organic matter (including shed human and animal skin). Dust mites are a microscopic type of insect that you cannot see with the naked eye. High levels  of dust mites have been detected from mattresses, pillows, carpets, upholstered furniture, bed covers, clothes, soft toys and any woven material. The principal allergen of the dust mite is found in its feces. A gram of dust may contain 1,000 mites and 250,000 fecal particles. Mite antigen is easily measured in the air during house cleaning activities. Dust mites do not bite and do not cause harm to humans, other than by triggering allergies/asthma.  Ways to decrease your exposure to dust mites in your home:  1. Encase mattresses, box springs and pillows with a mite-impermeable barrier or cover  2. Wash sheets, blankets and drapes weekly in hot water (130 F) with detergent and dry them in a dryer on the hot setting.  3. Have the room cleaned frequently with a vacuum cleaner and a damp dust-mop. For carpeting or rugs, vacuuming with a vacuum cleaner equipped with a high-efficiency particulate air (HEPA) filter. The dust mite allergic individual should not be in a room which is being cleaned and should wait 1 hour after cleaning before going into the room.  4. Do not sleep on upholstered furniture (eg, couches).  5. If possible removing carpeting, upholstered furniture and drapery from the home is ideal. Horizontal blinds should be eliminated in the rooms where the person spends the most time (bedroom, study, television room). Washable vinyl, roller-type shades are optimal.  6. Remove all non-washable stuffed toys from the bedroom. Wash stuffed toys weekly like sheets and blankets above.  7. Reduce indoor humidity to less than 50%. Inexpensive humidity monitors can be purchased at most hardware stores. Do not use a humidifier as can make the problem worse and are not recommended.  Control of Cockroach Allergen  Cockroach allergen has been identified as an important cause of acute attacks of asthma, especially in urban settings.  There are fifty-five species of cockroach that exist in the [place], however only three, the [place], [place] species produce allergen that can affect patients with Asthma.  Allergens can be obtained from fecal particles, egg casings and secretions from cockroaches.    1. Remove food sources. 2. Reduce access to water. 3. Seal access and entry points. 4. Spray runways with 0.5-1% Diazinon or Chlorpyrifos 5. Blow boric acid power under stoves and refrigerator. 6. Place bait stations (hydramethylnon) at feeding sites.

## 2020-09-23 NOTE — Unmapped (Signed)
Krista Williamson 's Humira shipment will be delayed as a result of the medication is too soon to refill until 09/30/20.     I have reached out to the patient and was unable to leave a message. We will wait for a call back from the patient to reschedule the delivery.  We have not confirmed the new delivery date.      Pt mailbox full.

## 2020-09-24 ENCOUNTER — Ambulatory Visit (INDEPENDENT_AMBULATORY_CARE_PROVIDER_SITE_OTHER): Payer: Medicare Other | Admitting: Family Medicine

## 2020-09-24 ENCOUNTER — Encounter: Payer: Self-pay | Admitting: Family Medicine

## 2020-09-24 ENCOUNTER — Other Ambulatory Visit: Payer: Self-pay

## 2020-09-24 VITALS — BP 106/66 | HR 77 | Temp 98.2°F | Resp 16 | Ht 63.0 in | Wt 142.8 lb

## 2020-09-24 DIAGNOSIS — J449 Chronic obstructive pulmonary disease, unspecified: Secondary | ICD-10-CM

## 2020-09-24 DIAGNOSIS — J302 Other seasonal allergic rhinitis: Secondary | ICD-10-CM | POA: Insufficient documentation

## 2020-09-24 DIAGNOSIS — H1013 Acute atopic conjunctivitis, bilateral: Secondary | ICD-10-CM | POA: Diagnosis not present

## 2020-09-24 DIAGNOSIS — H1010 Acute atopic conjunctivitis, unspecified eye: Secondary | ICD-10-CM

## 2020-09-24 DIAGNOSIS — J3089 Other allergic rhinitis: Secondary | ICD-10-CM | POA: Insufficient documentation

## 2020-09-24 DIAGNOSIS — H6983 Other specified disorders of Eustachian tube, bilateral: Secondary | ICD-10-CM | POA: Diagnosis not present

## 2020-09-24 DIAGNOSIS — Z720 Tobacco use: Secondary | ICD-10-CM

## 2020-09-24 MED ORDER — CARBINOXAMINE MALEATE 4 MG PO TABS
8.0000 mg | ORAL_TABLET | Freq: Two times a day (BID) | ORAL | 5 refills | Status: DC
Start: 2020-09-24 — End: 2021-03-13

## 2020-09-24 MED ORDER — ADVAIR HFA 230-21 MCG/ACT IN AERO: 2.0000 | INHALATION_SPRAY | Freq: Two times a day (BID) | RESPIRATORY_TRACT | 5 refills | Status: DC

## 2020-09-24 MED ORDER — ALBUTEROL SULFATE HFA 108 (90 BASE) MCG/ACT IN AERS: 2.0000 | INHALATION_SPRAY | RESPIRATORY_TRACT | 1 refills | Status: DC | PRN

## 2020-09-26 NOTE — Unmapped (Signed)
[name] 's Humira shipment will be sent out  as a result of the medication is too soon to refill until 2/28.     I have reached out to the patient and communicated the delay. We will reschedule the medication for the delivery date that the patient agreed upon.  We have confirmed the delivery date as 2/29, via ups.

## 2020-10-01 DIAGNOSIS — L732 Hidradenitis suppurativa: Principal | ICD-10-CM

## 2020-10-01 DIAGNOSIS — Z79899 Other long term (current) drug therapy: Principal | ICD-10-CM

## 2020-10-01 MED ORDER — ADALIMUMAB 80 MG/0.8 ML SUBCUTANEOUS PEN KIT
SUBCUTANEOUS | 3 refills | 0.00000 days | Status: CP
Start: 2020-10-01 — End: 2020-10-01

## 2020-10-01 MED ORDER — HUMIRA(CF) PEN 80 MG/0.8 ML SUBCUTANEOUS KIT
SUBCUTANEOUS | 3 refills | 0.00000 days | Status: CP
Start: 2020-10-01 — End: ?
  Filled 2020-10-02: qty 4, 28d supply, fill #0

## 2020-10-01 NOTE — Unmapped (Signed)
Krista Williamson 's Humira shipment will be delayed as a result of prior authorization being required by the patient's insurance.     I have reached out to the patient and left a voicemail message.  We will call the patient back to reschedule the delivery upon resolution. We have not confirmed the new delivery date.

## 2020-10-02 NOTE — Unmapped (Signed)
GAE BIHL 's Humira shipment will be sent out  as a result of prior authorization now approved.     I have reached out to the patient and communicated the delivery change. We will reschedule the medication for the delivery date that the patient agreed upon.  We have confirmed the delivery date as 3/3, via ups.

## 2020-10-05 LAB — LIPID PANEL
Chol/HDL Ratio: 6.7 ratio — ABNORMAL HIGH (ref 0.0–4.4)
Cholesterol, Total: 173 mg/dL (ref 100–199)
HDL: 26 mg/dL — ABNORMAL LOW (ref >39)
LDL Chol Calc (NIH): 134 mg/dL — ABNORMAL HIGH (ref 0–99)
Triglycerides: 69 mg/dL (ref 0–149)
VLDL Cholesterol Cal: 13 mg/dL (ref 5–40)

## 2020-10-16 NOTE — Unmapped (Signed)
[facility] Refill Coordination Note    Clarified with patient that the current dosage is one pen once a week, patient was still taking 2 pens every week.    Specialty Medication(s) to be Shipped:   Inflammatory Disorders: Humira CF 80 mg/0.8 ml    Other medication(s) to be shipped: No additional medications requested for fill at this time     Krista Williamson, DOB: 12-19-1970  Phone: [phone] (home)       All above HIPAA information was verified with patient.     Was a Nurse, learning disability used for this call? No    Completed refill call assessment today to schedule patient's medication shipment from the [facility] [phone]).       Specialty medication(s) and dose(s) confirmed: Regimen is correct and unchanged.   Changes to medications: Jannice reports no changes at this time.  Changes to insurance: No  Questions for the pharmacist: No    Confirmed patient received Welcome Packet with first shipment. The patient will receive a drug information handout for each medication shipped and additional FDA Medication Guides as required.       DISEASE/MEDICATION-SPECIFIC INFORMATION        For patients on injectable medications: Patient currently has 2 doses left.  Next injection is scheduled for 10/17/2020.    SPECIALTY MEDICATION ADHERENCE     Medication Adherence    Patient reported X missed doses in the last month: 0  Specialty Medication: Humira CF 80 mg/0.8 ml  Patient is on additional specialty medications: No  Any gaps in refill history greater than 2 weeks in the last 3 months: no  Demonstrates understanding of importance of adherence: yes  Informant: patient  Reliability of informant: reliable  Confirmed plan for next specialty medication refill: delivery by pharmacy  Refills needed for supportive medications: not needed                      SHIPPING     Shipping address confirmed in Epic.     Delivery Scheduled: Yes, Expected medication delivery date: 10/23/2020.     Medication will be delivered via UPS to the prescription address in Epic WAM.    Hodges Treiber D Artemis Koller   [facility] Shared [facility] Specialty Technician

## 2020-10-22 MED FILL — HUMIRA(CF) PEN 80 MG/0.8 ML SUBCUTANEOUS KIT: 28 days supply | Qty: 4 | Fill #1

## 2020-11-19 NOTE — Unmapped (Signed)
Krista Williamson reports her HS is much improved on Humira 80 mg/week. She very rarely has a flare.    She does report continued frequent sinus issues - she sees a specialist for this, and she reports they are aware she's on Humira. We discussed if infections worsen we could consider doing something different with her Humira.       [facility] Shared [facility] Clinical Assessment & Refill Coordination Note    Krista Williamson, DOB: 1971-03-18  Phone: [phone] (home) `    All above HIPAA information was verified with patient.     Was a Nurse, learning disability used for this call? No    Specialty Medication(s):   Inflammatory Disorders: Humira     Current Outpatient Medications   Medication Sig Dispense Refill   ??? acetaminophen (TYLENOL) 325 MG tablet Frequency:   Dosage:0.0     Instructions:  Note:     ??? adalimumab (HUMIRA,CF, PEN) 80 mg/0.8 mL PnKt Inject the contents of 1 pen (80mg ) under the skin weekly. 4 each 3   ??? atorvastatin (LIPITOR) 10 MG tablet Take 10 mg by mouth.     ??? buPROPion (WELLBUTRIN SR) 150 MG 12 hr tablet Take 150 mg by mouth.     ??? cephalexin (KEFLEX) 500 MG capsule Take 500 mg by mouth Two (2) times a day.     ??? clindamycin (CLEOCIN) 300 MG capsule Take 300 MG BID with food for flares. 60 capsule 2   ??? clonazePAM (KLONOPIN) 1 MG tablet Take 1 mg by mouth daily as needed for anxiety.     ??? HYDROcodone-acetaminophen (NORCO) 5-325 mg per tablet Take 1 tablet by mouth.     ??? ibuprofen (ADVIL,MOTRIN) 200 MG tablet Take 400 mg by mouth.     ??? lubiprostone (AMITIZA) 8 MCG capsule Take 8 mcg by mouth 2 (two) times a day with meals.     ??? naproxen sodium (ALEVE) 220 MG tablet Take 440-660 mg by mouth.     ??? ofloxacin (OCUFLOX) 0.3 % ophthalmic solution Apply 5 drops in left ear twice daily for 7 days     ??? rifAMPin (RIFADIN) 300 MG capsule Take 1 capsule (300 mg total) by mouth Two (2) times a day. Take for flares. 60 capsule 2   ??? traMADol (ULTRAM) 50 mg tablet Take 1 tablet (50 mg total) by mouth every six (6) hours as needed for pain. 10 tablet 0     No current facility-administered medications for this visit.        Changes to medications: Dejae reports no changes at this time.    No Known Allergies    Changes to allergies: No    SPECIALTY MEDICATION ADHERENCE     Humira 40/0.4 mg/ml: 1 dose left  Medication Adherence    Patient reported X missed doses in the last month: 0  Specialty Medication: Humira  Patient is on additional specialty medications: No         Specialty medication(s) dose(s) confirmed: Regimen is correct and unchanged.     Are there any concerns with adherence? No    Adherence counseling provided? Not needed    CLINICAL MANAGEMENT AND INTERVENTION      Clinical Benefit Assessment:    Do you feel the medicine is effective or helping your condition? Yes    Clinical Benefit counseling provided? Not needed    Adverse Effects Assessment:    Are you experiencing any side effects? No    Are you experiencing difficulty administering  your medicine? No    Quality of Life Assessment:    How many days over the past month did your HS  keep you from your normal activities? For example, brushing your teeth or getting up in the morning. 0    Have you discussed this with your provider? Not needed      Acute Infection Status:    Acute infections noted within Epic:  No active infections    Patient reported infection: None    Therapy Appropriateness:    Is therapy appropriate? Yes, therapy is appropriate and should be continued    DISEASE/MEDICATION-SPECIFIC INFORMATION      For patients on injectable medications: Patient currently has 1 doses left.  Next injection is scheduled for 4/21.    PATIENT SPECIFIC NEEDS     - Does the patient have any physical, cognitive, or cultural barriers? No    - Is the patient high risk? No    - Does the patient require a Care Management Plan? No     - Does the patient require physician intervention or other additional services (i.e. nutrition, smoking cessation, social work)? No      SHIPPING     Specialty Medication(s) to be Shipped:   Inflammatory Disorders: Humira    Other medication(s) to be shipped: No additional medications requested for fill at this time     Changes to insurance: No    Delivery Scheduled: Yes, Expected medication delivery date: Tues, 4/26.     Medication will be delivered via UPS to the confirmed prescription address in [facility].    The patient will receive a drug information handout for each medication shipped and additional FDA Medication Guides as required.  Verified that patient has previously received a Conservation officer, historic buildings.    All of the patient's questions and concerns have been addressed.    [name]   [facility] Shared [facility] Specialty Pharmacist

## 2020-11-25 MED FILL — HUMIRA(CF) PEN 80 MG/0.8 ML SUBCUTANEOUS KIT: 28 days supply | Qty: 4 | Fill #2

## 2020-12-13 NOTE — Unmapped (Signed)
[facility] Refill Coordination Note    Specialty Medication(s) to be Shipped:   Inflammatory Disorders: Humira    Other medication(s) to be shipped: No additional medications requested for fill at this time     Krista Williamson, DOB: May 07, 1971  Phone: [phone] (home)       All above HIPAA information was verified with patient.     Was a Nurse, learning disability used for this call? No    Completed refill call assessment today to schedule patient's medication shipment from the [facility] [phone]).  All relevant notes have been reviewed.     Specialty medication(s) and dose(s) confirmed: Regimen is correct and unchanged.   Changes to medications: Messina reports no changes at this time.  Changes to insurance: No  New side effects reported not previously addressed with a pharmacist or physician: None reported  Questions for the pharmacist: No    Confirmed patient received a Conservation officer, historic buildings and a Surveyor, mining with first shipment. The patient will receive a drug information handout for each medication shipped and additional FDA Medication Guides as required.       DISEASE/MEDICATION-SPECIFIC INFORMATION        For patients on injectable medications: Patient currently has 1 doses left.  Next injection is scheduled for 12/19/2020.    SPECIALTY MEDICATION ADHERENCE     Medication Adherence    Patient reported X missed doses in the last month: 0  Specialty Medication: Humira CF 80 mg/0.8 ml  Patient is on additional specialty medications: No  Any gaps in refill history greater than 2 weeks in the last 3 months: no  Demonstrates understanding of importance of adherence: yes  Informant: patient  Reliability of informant: reliable  Confirmed plan for next specialty medication refill: delivery by pharmacy  Refills needed for supportive medications: not needed              Were doses missed due to medication being on hold? No    Humira CF 80/0.8 mg/ml: 7 days of medicine on hand REFERRAL TO PHARMACIST     Referral to the pharmacist: Not needed      [facility]     Shipping address confirmed in Epic.     Delivery Scheduled: Yes, Expected medication delivery date: 12/19/2020.     Medication will be delivered via UPS to the prescription address in Epic WAM.    Gloristine Turrubiates D Konnor Vondrasek   [facility] Shared [facility] Specialty Technician

## 2020-12-18 MED FILL — HUMIRA(CF) PEN 80 MG/0.8 ML SUBCUTANEOUS KIT: 28 days supply | Qty: 4 | Fill #3

## 2021-01-09 DIAGNOSIS — L732 Hidradenitis suppurativa: Principal | ICD-10-CM

## 2021-01-09 DIAGNOSIS — Z79899 Other long term (current) drug therapy: Principal | ICD-10-CM

## 2021-01-09 MED ORDER — HUMIRA(CF) PEN 80 MG/0.8 ML SUBCUTANEOUS KIT
3 refills | 0 days
Start: 2021-01-09 — End: ?

## 2021-01-09 NOTE — Unmapped (Signed)
[facility] Pharm[facility] Refill Coordination Note    Specialty Medication(s) to be Shipped:   Inflammatory Disorders: Humira    Other medication(s) to be shipped: No additional medications requested for fill at this time     Krista Williamson, DOB: 01/13/1971  Phone: [phone] (home)       All above HIPAA information was verified with patient.     Was a Nurse, learning disability used for this call? No    Completed refill call assessment today to schedule patient's medication shipment from the [facility] [phone]).  All relevant notes have been reviewed.     Specialty medication(s) and dose(s) confirmed: Regimen is correct and unchanged.   Changes to medications: Nadene reports no changes at this time.  Changes to insurance: No  New side effects reported not previously addressed with a pharmacist or physician: None reported  Questions for the pharmacist: No    Confirmed patient received a Conservation officer, historic buildings and a Surveyor, mining with first shipment. The patient will receive a drug information handout for each medication shipped and additional FDA Medication Guides as required.       DISEASE/MEDICATION-SPECIFIC INFORMATION        For patients on injectable medications: Patient currently has 1 doses left.  Next injection is scheduled for 01/16/2021.    SPECIALTY MEDICATION ADHERENCE     Medication Adherence    Patient reported X missed doses in the last month: 0  Specialty Medication: Humira CF 80 mg/0.8 ml  Patient is on additional specialty medications: No  Any gaps in refill history greater than 2 weeks in the last 3 months: no  Demonstrates understanding of importance of adherence: yes  Informant: patient  Reliability of informant: reliable  Confirmed plan for next specialty medication refill: delivery by pharmacy  Refills needed for supportive medications: not needed              Were doses missed due to medication being on hold? No    Humira CF 80/0.8 mg/ml: 7 days of medicine on hand REFERRAL TO PHARMACIST     Referral to the pharmacist: Not needed      [facility]     Shipping address confirmed in Epic.     Delivery Scheduled: Yes, Expected medication delivery date: 01/16/2021.  However, Rx request for refills was sent to the provider as there are none remaining.     Medication will be delivered via UPS to the prescription address in Epic WAM.    Krista Williamson   [facility] Shared [facility] Specialty Technician

## 2021-01-10 MED ORDER — HUMIRA(CF) PEN 80 MG/0.8 ML SUBCUTANEOUS KIT
SUBCUTANEOUS | 3 refills | 0.00000 days | Status: CP
Start: 2021-01-10 — End: ?
  Filled 2021-01-15: qty 4, 28d supply, fill #0

## 2021-02-11 NOTE — Unmapped (Signed)
The [facility] has made a second and final attempt to reach this patient to refill the following medication:Humira.      We have left voicemails on the following phone numbers: [phone] and have sent a MyChart message.    Dates contacted: 7/6 and 7/12  Last scheduled delivery: 6/16    The patient may be at risk of non-compliance with this medication. The patient should call the [facility] at [phone] (option 4) to refill medication.    Krisa Blattner D Administrator Shared [facility] Specialty Technician

## 2021-03-13 ENCOUNTER — Telehealth: Payer: Self-pay | Admitting: Family Medicine

## 2021-03-13 MED ORDER — ADVAIR HFA 230-21 MCG/ACT IN AERO: 2.0000 | INHALATION_SPRAY | Freq: Two times a day (BID) | RESPIRATORY_TRACT | 0 refills | Status: DC

## 2021-03-13 MED ORDER — ALBUTEROL SULFATE HFA 108 (90 BASE) MCG/ACT IN AERS: 2.0000 | INHALATION_SPRAY | RESPIRATORY_TRACT | 0 refills | Status: DC | PRN

## 2021-03-13 MED ORDER — CARBINOXAMINE MALEATE 4 MG PO TABS: 8.0000 mg | ORAL_TABLET | Freq: Two times a day (BID) | ORAL | 0 refills | Status: DC

## 2021-03-13 NOTE — Telephone Encounter (Signed)
Refills sent in for pt pt is due for office visit

## 2021-03-13 NOTE — Telephone Encounter (Signed)
Pt would like refills for Carbinoxamine Maleate, Albuterol, and Advair. [facility] [place] - [address] [address]  [address] [place] [place] [id number]

## 2021-03-24 ENCOUNTER — Ambulatory Visit: Payer: Medicare Other | Admitting: Family Medicine

## 2021-04-09 NOTE — Progress Notes (Signed)
[address] [place]  [address] Dept: [phone]  FOLLOW UP NOTE  Patient ID: Bailey Mcdaniel, female    DOB: [birth year]  Age: 50 y.o. MRN: [id number] Date of Office Visit: 04/10/2021  Assessment  Chief Complaint: Asthma  HPI Bailey Mcdaniel is a 50 year old female who presents to the clinic for follow-up visit.  She was last seen in this clinic on 09/24/2020 for evaluation of asthma COPD overlap syndrome, allergic rhinitis, allergic conjunctivitis, tobacco use, and eustachian tube dysfunction.  In the interim, she has visited Dr. Jenne Pane, ENT, and had her tympanostomy tubes replaced.  At today's visit, she reports her asthma has been moderately well controlled with 2 "episodes" occurring since her last visit to this clinic.  She reports these episodes occurred when she had been outside or cleaning and included symptoms of shortness of breath, wheeze, dry cough, watery itchy eyes, and sneezing.  Otherwise, she reports her asthma has been well controlled with no shortness of breath, cough, or wheeze.  She continues albuterol infrequently and does not use albuterol before activity.  She did use Advair for a few days in a row a few months ago with relief of symptoms.  Allergic rhinitis is reported as moderately well controlled with occasional clear rhinorrhea, sneezing, and occasional postnasal drainage.  She continues carbinoxamine 4-8 mg once or twice a day and Flonase as needed.  She is not currently using a nasal saline rinse or azelastine.  Allergic conjunctivitis is reported as moderately well controlled with occasional red and itchy eyes for which she uses olopatadine with relief of symptoms.  She continues to smoke about 10 cigarettes each day and reports that she is trying to quit smoking.  Her current medications are listed in the chart.  Drug Allergies:  No Known Allergies  Physical Exam: BP 126/74 (BP Location: Right Arm, Patient Position: Sitting, Cuff Size: Normal)    Pulse 82   Temp 98.6 F (37 C) (Temporal)   Resp 20   Ht 5' 3.39" (1.61 m)   Wt 142 lb 3.2 oz (64.5 kg)   SpO2 99%   BMI 24.88 kg/m    Physical Exam Vitals reviewed.  Constitutional:      Appearance: Normal appearance.  HENT:     Head: Normocephalic and atraumatic.     Right Ear: Tympanic membrane normal.     Left Ear: Tympanic membrane normal.     Ears:     Comments: Bilateral tympanostomy tubes remain in place    Nose:     Comments: Bilateral nares slightly erythematous with clear nasal drainage noted.  Pharynx normal.  Ears normal.  Eyes normal.    Mouth/Throat:     Pharynx: Oropharynx is clear.  Eyes:     Conjunctiva/sclera: Conjunctivae normal.  Cardiovascular:     Rate and Rhythm: Normal rate and regular rhythm.     Heart sounds: Normal heart sounds. No murmur heard. Pulmonary:     Effort: Pulmonary effort is normal.     Breath sounds: Normal breath sounds.     Comments: Lungs clear to auscultation Musculoskeletal:        General: Normal range of motion.     Cervical back: Normal range of motion and neck supple.  Skin:    General: Skin is warm and dry.  Neurological:     Mental Status: She is alert and oriented to person, place, and time.  Psychiatric:        Mood and Affect: Mood normal.  Behavior: Behavior normal.        Thought Content: Thought content normal.        Judgment: Judgment normal.    Diagnostics: FVC 3.36, FEV1 2.30.  Predicted FVC 3.06, predicted FEV1 2.42.  Spirometry indicates normal ventilatory function.  Assessment and Plan: 1. Asthma-COPD overlap syndrome (HCC)   2. Seasonal and perennial allergic rhinitis   3. Seasonal allergic conjunctivitis   4. Dysfunction of both eustachian tubes   5. Tobacco user     Meds ordered this encounter  Medications   Carbinoxamine Maleate 4 MG TABS    Sig: Take 2 tablets (8 mg total) by mouth in the morning and at bedtime.    Dispense:  360 tablet    Refill:  1    Please dispense 90 day  supply.     Patient Instructions  Asthma COPD overlap Continue albuterol 2 puffs once every 4 hours as needed for cough or wheeze You may use albuterol 2 puffs 5-15 minutes before activity to decrease cough or wheeze For asthma flare, begin Advair 230-2 puffs twice a day with a spacer for 2 weeks or until cough and wheeze free. Call the clinic if you need to start this  Allergic rhinitis Continue carbinoxamine 8 mg twice a day for nasal symptoms Continue Flonase 2 sprays in each nostril once a day for stuffy nose.  In the right nostril, point the applicator out toward the right ear. In the left nostril, point the applicator out toward the left ear Begin saline nasal rinses as needed for nasal symptoms. Use this before any medicated nasal sprays for best result Continue azelastine 2 sprays in each nostril twice a day as needed for a runny nose or sinus headache Continue allergen avoidance measures directed toward pollen, pets, dust mites, and cockroach as listed below For thick post nasal drainage, begin Mucinex (guaifenesin) 1200 mg twice a day and increase hydration in order to this mucus   Allergic conjunctivitis Some over the counter eye drops include Pataday one drop in each eye once a day as needed for red, itchy eyes OR Zaditor one drop in each eye twice a day as needed for red itchy eyes.  Tobacco use Stop smoking or try to cut down on smoking.  Written information provided from [employer] at her last visit to this clinic  Eustachian tube dysfunction Tympanostomy tubes remain in place Continue to follow up with your ENT specialist, Dr. Jenne Pane as prescribed  Call the clinic if this treatment plan is not working well for you  Follow up in 6 months or sooner if needed.   Return in about 6 months (around 10/08/2021), or if symptoms worsen or fail to improve.    Thank you for the opportunity to care for this patient.  Please do not hesitate to contact me with questions.  Thermon Leyland, FNP [facility]

## 2021-04-09 NOTE — Patient Instructions (Addendum)
Asthma COPD overlap Continue albuterol 2 puffs once every 4 hours as needed for cough or wheeze You may use albuterol 2 puffs 5-15 minutes before activity to decrease cough or wheeze For asthma flare, begin Advair 230-2 puffs twice a day with a spacer for 2 weeks or until cough and wheeze free. Call the clinic if you need to start this  Allergic rhinitis Continue carbinoxamine 8 mg twice a day for nasal symptoms Continue Flonase 2 sprays in each nostril once a day for stuffy nose.  In the right nostril, point the applicator out toward the right ear. In the left nostril, point the applicator out toward the left ear Begin saline nasal rinses as needed for nasal symptoms. Use this before any medicated nasal sprays for best result Continue azelastine 2 sprays in each nostril twice a day as needed for a runny nose or sinus headache Continue allergen avoidance measures directed toward pollen, pets, dust mites, and cockroach as listed below For thick post nasal drainage, begin Mucinex (guaifenesin) 1200 mg twice a day and increase hydration in order to this mucus   Allergic conjunctivitis Some over the counter eye drops include Pataday one drop in each eye once a day as needed for red, itchy eyes OR Zaditor one drop in each eye twice a day as needed for red itchy eyes.  Tobacco use Stop smoking or try to cut down on smoking.  Written information provided from [employer] at her last visit to this clinic  Eustachian tube dysfunction Tympanostomy tubes remain in place Continue to follow up with your ENT specialist, Dr. Jenne Pane as prescribed  Call the clinic if this treatment plan is not working well for you  Follow up in 6 months or sooner if needed.  Reducing Pollen Exposure The American Academy of Allergy, Asthma and Immunology suggests the following steps to reduce your exposure to pollen during allergy seasons. Do not hang sheets or clothing out to dry; pollen may collect on these items. Do not  mow lawns or spend time around freshly cut grass; mowing stirs up pollen. Keep windows closed at night.  Keep car windows closed while driving. Minimize morning activities outdoors, a time when pollen counts are usually at their highest. Stay indoors as much as possible when pollen counts or humidity is high and on windy days when pollen tends to remain in the air longer. Use air conditioning when possible.  Many air conditioners have filters that trap the pollen spores. Use a HEPA room air filter to remove pollen form the indoor air you breathe.  Control of Dog or Cat Allergen Avoidance is the best way to manage a dog or cat allergy. If you have a dog or cat and are allergic to dog or cats, consider removing the dog or cat from the home. If you have a dog or cat but don't want to find it a new home, or if your family wants a pet even though someone in the household is allergic, here are some strategies that may help keep symptoms at bay:  Keep the pet out of your bedroom and restrict it to only a few rooms. Be advised that keeping the dog or cat in only one room will not limit the allergens to that room. Don't pet, hug or kiss the dog or cat; if you do, wash your hands with soap and water. High-efficiency particulate air (HEPA) cleaners run continuously in a bedroom or living room can reduce allergen levels over time. Regular use of  a high-efficiency vacuum cleaner or a central vacuum can reduce allergen levels. Giving your dog or cat a bath at least once a week can reduce airborne allergen.   Control of Dust Mite Allergen Dust mites play a major role in allergic asthma and rhinitis. They occur in environments with high humidity wherever human skin is found. Dust mites absorb humidity from the atmosphere (ie, they do not drink) and feed on organic matter (including shed human and animal skin). Dust mites are a microscopic type of insect that you cannot see with the naked eye. High levels of dust  mites have been detected from mattresses, pillows, carpets, upholstered furniture, bed covers, clothes, soft toys and any woven material. The principal allergen of the dust mite is found in its feces. A gram of dust may contain 1,000 mites and 250,000 fecal particles. Mite antigen is easily measured in the air during house cleaning activities. Dust mites do not bite and do not cause harm to humans, other than by triggering allergies/asthma.  Ways to decrease your exposure to dust mites in your home:  1. Encase mattresses, box springs and pillows with a mite-impermeable barrier or cover  2. Wash sheets, blankets and drapes weekly in hot water (130 F) with detergent and dry them in a dryer on the hot setting.  3. Have the room cleaned frequently with a vacuum cleaner and a damp dust-mop. For carpeting or rugs, vacuuming with a vacuum cleaner equipped with a high-efficiency particulate air (HEPA) filter. The dust mite allergic individual should not be in a room which is being cleaned and should wait 1 hour after cleaning before going into the room.  4. Do not sleep on upholstered furniture (eg, couches).  5. If possible removing carpeting, upholstered furniture and drapery from the home is ideal. Horizontal blinds should be eliminated in the rooms where the person spends the most time (bedroom, study, television room). Washable vinyl, roller-type shades are optimal.  6. Remove all non-washable stuffed toys from the bedroom. Wash stuffed toys weekly like sheets and blankets above.  7. Reduce indoor humidity to less than 50%. Inexpensive humidity monitors can be purchased at most hardware stores. Do not use a humidifier as can make the problem worse and are not recommended.  Control of Cockroach Allergen  Cockroach allergen has been identified as an important cause of acute attacks of asthma, especially in urban settings.  There are fifty-five species of cockroach that exist in the [place],  however only three, the [place], [place] species produce allergen that can affect patients with Asthma.  Allergens can be obtained from fecal particles, egg casings and secretions from cockroaches.    Remove food sources. Reduce access to water. Seal access and entry points. Spray runways with 0.5-1% Diazinon or Chlorpyrifos Blow boric acid power under stoves and refrigerator. Place bait stations (hydramethylnon) at feeding sites.

## 2021-04-09 NOTE — Unmapped (Signed)
[facility] Refill Coordination Note    Specialty Medication(s) to be Shipped:   Inflammatory Disorders: Humira    Other medication(s) to be shipped: No additional medications requested for fill at this time     Krista Williamson, DOB: [birth year]  Phone: [phone] (home)       All above HIPAA information was verified with patient.     Was a Nurse, learning disability used for this call? No    Completed refill call assessment today to schedule patient's medication shipment from the [facility] [phone]).  All relevant notes have been reviewed.     Specialty medication(s) and dose(s) confirmed: Regimen is correct and unchanged.   Changes to medications: Lenya reports no changes at this time.  Changes to insurance: No  New side effects reported not previously addressed with a pharmacist or physician: None reported  Questions for the pharmacist: No    Confirmed patient received a Conservation officer, historic buildings and a Surveyor, mining with first shipment. The patient will receive a drug information handout for each medication shipped and additional FDA Medication Guides as required.       DISEASE/MEDICATION-SPECIFIC INFORMATION        For patients on injectable medications: Patient currently has 0 doses left.  Next injection is scheduled for ASAP.    SPECIALTY MEDICATION ADHERENCE     Medication Adherence    Patient reported X missed doses in the last month: 3  Specialty Medication: Humira CF 80 mg/0.8 ml  Patient is on additional specialty medications: No       Were doses missed due to medication being on hold? No    REFERRAL TO PHARMACIST     Referral to the pharmacist: Not needed      [facility]     Shipping address confirmed in Epic.     Delivery Scheduled: Yes, Expected medication delivery date: 04/11/2021.     Medication will be delivered via UPS to the prescription address in Epic WAM.    Lorelei Pont [facility] Specialty Technician

## 2021-04-10 ENCOUNTER — Encounter: Payer: Self-pay | Admitting: Family Medicine

## 2021-04-10 ENCOUNTER — Other Ambulatory Visit: Payer: Self-pay

## 2021-04-10 ENCOUNTER — Ambulatory Visit (INDEPENDENT_AMBULATORY_CARE_PROVIDER_SITE_OTHER): Payer: Medicare Other | Admitting: Family Medicine

## 2021-04-10 VITALS — BP 126/74 | HR 82 | Temp 98.6°F | Resp 20 | Ht 63.39 in | Wt 142.2 lb

## 2021-04-10 DIAGNOSIS — J302 Other seasonal allergic rhinitis: Secondary | ICD-10-CM

## 2021-04-10 DIAGNOSIS — Z720 Tobacco use: Secondary | ICD-10-CM

## 2021-04-10 DIAGNOSIS — J3089 Other allergic rhinitis: Secondary | ICD-10-CM | POA: Diagnosis not present

## 2021-04-10 DIAGNOSIS — H6983 Other specified disorders of Eustachian tube, bilateral: Secondary | ICD-10-CM

## 2021-04-10 DIAGNOSIS — H1010 Acute atopic conjunctivitis, unspecified eye: Secondary | ICD-10-CM

## 2021-04-10 DIAGNOSIS — H1013 Acute atopic conjunctivitis, bilateral: Secondary | ICD-10-CM | POA: Diagnosis not present

## 2021-04-10 DIAGNOSIS — J449 Chronic obstructive pulmonary disease, unspecified: Secondary | ICD-10-CM | POA: Diagnosis not present

## 2021-04-10 MED ORDER — CARBINOXAMINE MALEATE 4 MG PO TABS: 8.0000 mg | ORAL_TABLET | Freq: Two times a day (BID) | ORAL | 1 refills | Status: AC

## 2021-04-10 MED FILL — HUMIRA(CF) PEN 80 MG/0.8 ML SUBCUTANEOUS KIT: 28 days supply | Qty: 4 | Fill #1

## 2021-04-11 NOTE — Unmapped (Signed)
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 Patient elects for ILK today.    Intralesional Kenalog Procedure Note: After the patient was informed of risks (including atrophy and dyspigmentation), benefits and side effects of intralesional steroid injection, the patient elected to undergo injection and verbal consent was obtained. Skin was cleaned with alcohol and injected intralesionally into the sites (below). The patient tolerated the procedure well without complications and was instructed on post-procedure care.  Location(s): L popliteal fossa  Number of sites treated: 1  Kenalog (triamcinolone) Concentration: 40 mg/ml   Volume: 0.2 ml total    Xanthelasmas  - Reviewed etiology and difficulty in treatment  - Patient defers intervention at this time since these are not currently symptomatic.    Epidermoid cyst on left upper eyelid  - Discussed association with HS  - Reassurance provided regarding the benign appearance of lesions noted on exam today, and that no treatment is indicated in the absence of symptoms/changes.    Hidradenitis suppurativa - stable  - Diagnosis, natural course, and treatment options were discussed with the patient.   - Continue humira 80mg  subcutaneous weekly   - Continue clindamycin/rifampin 300mg  BID prn flares.    High risk medication use, humira  - Quantiferon gold negative 08/2020    The patient was advised to call for an appointment should any new, changing, or symptomatic lesions develop.     RTC: Return in about 4 months (around 08/14/2021). or sooner as needed   _________________________________________________________________      Chief Complaint     Chief Complaint   Patient presents with   ??? Follow-up     Knot behind left knee for about 1 month, very itchy with no drainage, pcp prescribed triamcinolone has not been used yet. HPI     Krista Williamson is a 50 y.o. female who presents as a returning patient (last seen 08/14/2020) to [facility] for follow up of HS. At last visit, patient was continued on humira 80mg  weekly and clindamycin/rifampin for flares.    Patient reports her HS is currently well controlled on Humira. Her cyst on the right nipple has been stable and denies other active areas in the groin or under the breasts.     She notes an itchy knot behind her left knee present for the past 1 month that she has been picking at. Denies pain or drainage. She has treated with triamcinolone without improvement.     She also inquires about lesions on the bilateral upper eyelids.    Self-reported severity (0-5): 3  VAS pain today: 1  VAS average pain for the last month: 1  Requiring pain medication? No.  If so, what type/frequency? n/a  How often in pain?  Less than monthly   Level of odor (0-5):3  Level of itching (0-5):3  Dressing changes needed for drainage:No drainage/less than once weekly  How much drainage:no drainage   Flare in the last month (Y/N)? No.  How long ago was the last flare? Less than 6 months ago   Developing new lesions? No   Number of inflammatory lesions montly: 1-3  DLQI: 8  Current treatment: humira 80mg  weekly     How helpful is the current treatment in managing the following aspects of your disease?  Not at all helpful Somewhat helpful Very helpful   Pain ?? * ??  Decreasing length of flares ?? * ??   Decreasing new lesions ?? * ??   Drainage ?? * ??   Decreasing frequency of flares ?? * ??   Decreasing severity of flares ?? * ??   Odor ?? * ??   ??    The patient denies any other new or changing lesions or areas of concern.     Pertinent Past Medical History     HS  Disease course:  Year when symptoms first noticed: 50  Year of diagnosis: 1999  Location of first symptoms: groin and buttocks  Typical involved areas include: groin and buttocks  Typical number of inflammatory lesions each month at baseline (from first visit): 1-3  Disease triggers: none noted  ??  Prior treatments:  Topical: none  Systemic: adalimumab 80mg  weekly (increased spring 2019), clinda/rifampin, Augmentin, Keflex, doxycycline, Bactrim  Past surgical procedures: unroofing procedure in the bilateral inguinal folds in 04/2018 with excellent improvement in those areas.   Past laser procedures: none    Past Medical History, Family History, Social History, Medication List, Allergies, and Problem List were reviewed in the rooming section of Epic.     ROS: Other than symptoms mentioned in the HPI, no fevers, chills, or other skin complaints    Physical Examination     GENERAL: Well-appearing female in no acute distress, resting comfortably.  NEURO: Alert and oriented, answers questions appropriately  PSYCH: Normal mood and affect  SKIN (Focal Skin Exam): Per patient request, examination of left popliteal fossa and eyelids was performed  - left popliteal fossa with 1 cm excoriated papule  - a few flat topped yellow papules and one 5mm white nodule on the left upper eyelid    All areas not commented on are within normal limits or unremarkable    Scribe's Attestation: Elsie Stain, MD obtained and performed the history, physical exam and medical decision making elements that were entered into the chart.  Signed by Minette Brine, Scribe, on April 14, 2021 9:26 AM.    ----------------------------------------------------------------------------------------------------------------------  April 14, 2021 8:27 PM. Documentation assistance provided by the Scribe. I was present during the time the encounter was recorded. The information recorded by the Scribe was done at my direction and has been reviewed and validated by me.  ----------------------------------------------------------------------------------------------------------------------       (Approved Template 04/15/2020)

## 2021-04-14 ENCOUNTER — Ambulatory Visit: Admit: 2021-04-14 | Discharge: 2021-04-15 | Payer: MEDICARE

## 2021-04-14 DIAGNOSIS — L281 Prurigo nodularis: Principal | ICD-10-CM

## 2021-04-14 DIAGNOSIS — L910 Hypertrophic scar: Principal | ICD-10-CM

## 2021-04-14 DIAGNOSIS — L732 Hidradenitis suppurativa: Principal | ICD-10-CM

## 2021-04-14 DIAGNOSIS — H0260 Xanthelasma of unspecified eye, unspecified eyelid: Principal | ICD-10-CM

## 2021-04-14 NOTE — Unmapped (Deleted)
Self-reported severity (0-5): 3  VAS pain today: 1  VAS average pain for the last month: 1  Requiring pain medication? No.  If so, what type/frequency? ***  How often in pain?  Less than monthly   Level of odor (0-5):3  Level of itching (0-5):3  Dressing changes needed for drainage:No drainage/less than once weekly  How much drainage:no drainage   Flare in the last month (Y/N)? No.  How long ago was the last flare? Less than 6 months ago   Developing new lesions? No   Number of inflammatory lesions montly: 1-3  DLQI: 8  Current treatment: ***     How helpful is the current treatment in managing the following aspects of your disease?  Not at all helpful Somewhat helpful Very helpful   Pain  *    Decreasing length of flares  *    Decreasing new lesions  *    Drainage  *    Decreasing frequency of flares  *    Decreasing severity of flares  *    Odor  *

## 2021-04-14 NOTE — Unmapped (Signed)
Today we did a steroid injection (triamcinolone injection). You can remove the bandaid at any time.     What is triamcinolone injection?  Triamcinolone is a steroid that prevents the release of substances in the body that cause inflammation.  Triamcinolone injection is used to treat many different types of inflammatory conditions, including severe allergic reactions, skin disorders, severe colitis, inflammation of the joints or tendons, blood cell disorders, inflammatory eye disorders, lung disorders, and problems caused by low adrenal gland hormones.  Triamcinolone is also used to treat certain skin disorders caused by autoimmune conditions such as lupus, psoriasis, lichen planus, and others.  How is triamcinolone injection given?  Triamcinolone injection is given through a needle and can be injected into different areas of the body: into a muscle, into the space around a joint or tendon, or into a lesion on the skin. A healthcare provider will give you this injection.  What are the possible side effects of triamcinolone injection?  Get emergency medical help if you have signs of an allergic reaction: hives; difficult breathing; swelling of your face, lips, tongue, or throat.  Call your doctor at once if you have:  blurred vision, tunnel vision, eye pain, or seeing halos around lights;  unusual changes in mood or behavior;    Common side effects of triamcinolone injections into skin lesions may include:  Skin changes (acne, redness, bruising, discoloration of the skin);  Local thinning of the skin and underlying fat (atrophy), this tends to improve with time  increased local hair growth, or thinning hair;  a wound that is slow to heal    This is not a complete list of side effects and others may occur. Call your doctor for medical advice about side effects. You may report side effects to FDA at 1-800-FDA-1088.

## 2021-04-15 MED ADMIN — triamcinolone acetonide (KENALOG-40) injection 40 mg: 40 mg | INTRALESIONAL | Stop: 2021-04-14

## 2021-04-28 DIAGNOSIS — L732 Hidradenitis suppurativa: Principal | ICD-10-CM

## 2021-04-28 MED ORDER — RIFAMPIN 300 MG CAPSULE
ORAL_CAPSULE | Freq: Two times a day (BID) | ORAL | 2 refills | 30 days | Status: CP
Start: 2021-04-28 — End: ?

## 2021-04-28 MED ORDER — CLINDAMYCIN HCL 300 MG CAPSULE
ORAL_CAPSULE | 2 refills | 0 days | Status: CP
Start: 2021-04-28 — End: ?

## 2021-04-28 NOTE — Unmapped (Signed)
I reviewed this patient case and all documentation provided by the learner and was readily available for consultation during their interaction with the patient.  I agree with the assessment and plan listed below.    [name]   [facility] Specialty Pharmacist        [facility] Clinical Assessment & Refill Coordination Note    Shanda reports doing well overall. Reports she has felt improvement in intensity and frequency of flare ups since being on Humira.  Currently reports experiencing a flare up and would like refills of clindamycin and rifampin sent to her local pharmacy ([facility]).   Will inform dermatology clinic of request.    CYLIE DOR, DOB: Nov 08, 1970  Phone: [phone] (home)     All above HIPAA information was verified with patient.     Was a Nurse, learning disability used for this call? No    Specialty Medication(s):   Inflammatory Disorders: Humira 80mg /0.8 mL pen     Current Outpatient Medications   Medication Sig Dispense Refill   ??? acetaminophen (TYLENOL) 325 MG tablet Frequency:   Dosage:0.0     Instructions:  Note:     ??? adalimumab (HUMIRA,CF, PEN) 80 mg/0.8 mL PnKt Inject the contents of 1 pen (80mg ) under the skin weekly. 4 each 3   ??? atorvastatin (LIPITOR) 10 MG tablet Take 10 mg by mouth.     ??? buPROPion (WELLBUTRIN SR) 150 MG 12 hr tablet Take 150 mg by mouth.     ??? cephalexin (KEFLEX) 500 MG capsule Take 500 mg by mouth Two (2) times a day.     ??? clindamycin (CLEOCIN) 300 MG capsule Take 300 MG BID with food for flares. 60 capsule 2   ??? clonazePAM (KLONOPIN) 1 MG tablet Take 1 mg by mouth daily as needed for anxiety.     ??? HYDROcodone-acetaminophen (NORCO) 5-325 mg per tablet Take 1 tablet by mouth.     ??? ibuprofen (ADVIL,MOTRIN) 200 MG tablet Take 400 mg by mouth.     ??? lubiprostone (AMITIZA) 8 MCG capsule Take 8 mcg by mouth 2 (two) times a day with meals.     ??? naproxen sodium (ALEVE) 220 MG tablet Take 440-660 mg by mouth.     ??? ofloxacin (OCUFLOX) 0.3 % ophthalmic solution Apply 5 drops in left ear twice daily for 7 days     ??? rifAMPin (RIFADIN) 300 MG capsule Take 1 capsule (300 mg total) by mouth Two (2) times a day. Take for flares. 60 capsule 2   ??? traMADol (ULTRAM) 50 mg tablet Take 1 tablet (50 mg total) by mouth every six (6) hours as needed for pain. 10 tablet 0     No current facility-administered medications for this visit.        Changes to medications: Donabelle reports no changes at this time.    No Known Allergies    Changes to allergies: No    SPECIALTY MEDICATION ADHERENCE     Humira 80 mg/0.21mL 1 pen on hand     Medication Adherence    Patient reported X missed doses in the last month: 0  Specialty Medication: Humira 80mg /0.25mL pen  Patient is on additional specialty medications: No  Informant: patient  Confirmed plan for next specialty medication refill: delivery by pharmacy          Specialty medication(s) dose(s) confirmed: Regimen is correct and unchanged.     Are there any concerns with  adherence? No    Adherence counseling provided? Not needed    CLINICAL MANAGEMENT AND INTERVENTION      Clinical Benefit Assessment:    Do you feel the medicine is effective or helping your condition? Yes    Clinical Benefit counseling provided? Not needed    Adverse Effects Assessment:    Are you experiencing any side effects? No    Are you experiencing difficulty administering your medicine? No    Quality of Life Assessment:    Quality of Life    Rheumatology  Oncology  Dermatology  1. What impact has your specialty medication had on the symptoms of your skin condition (i.e. itchiness, soreness, stinging)?: Some  2. What impact has your specialty medication had on your comfort level with your skin?: Some  Cystic Fibrosis          How many days over the past month did your hidradenitis suppurativa  keep you from your normal activities? For example, brushing your teeth or getting up in the morning. 0    Have you discussed this with your provider? Not needed    Acute Infection Status:    Acute infections noted within Epic:  No active infections  Patient reported infection: None    Therapy Appropriateness:    Is therapy appropriate and patient progressing towards therapeutic goals? Yes, therapy is appropriate and should be continued    DISEASE/MEDICATION-SPECIFIC INFORMATION      For patients on injectable medications: Patient currently has 1 doses left.  Next injection is scheduled for 05/01/21.    PATIENT SPECIFIC NEEDS     - Does the patient have any physical, cognitive, or cultural barriers? No    - Is the patient high risk? No    - Does the patient require a Care Management Plan? No     - Does the patient require physician intervention or other additional services (i.e. nutrition, smoking cessation, social work)? No      SHIPPING     Specialty Medication(s) to be Shipped:   Inflammatory Disorders: Humira 80mg /0.8 mL pen    Other medication(s) to be shipped: No additional medications requested for fill at this time     Changes to insurance: No    Delivery Scheduled: Yes, Expected medication delivery date: 05/02/21.     Medication will be delivered via UPS to the confirmed prescription address in [facility].    The patient will receive a drug information handout for each medication shipped and additional FDA Medication Guides as required.  Verified that patient has previously received a Conservation officer, historic buildings and a Surveyor, mining.    The patient or caregiver noted above participated in the development of this care plan and knows that they can request review of or adjustments to the care plan at any time.      All of the patient's questions and concerns have been addressed.    Nonie Hoyer, PharmD  PGY1 Community-based Pharmacy Resident  [facility]

## 2021-05-01 MED FILL — HUMIRA(CF) PEN 80 MG/0.8 ML SUBCUTANEOUS KIT: 28 days supply | Qty: 4 | Fill #2

## 2021-05-23 NOTE — Unmapped (Signed)
[facility] Specialty Pharmacy Refill Coordination Note    Specialty Medication(s) to be Shipped:   Inflammatory Disorders: Humira    Other medication(s) to be shipped: No additional medications requested for fill at this time     Krista Williamson, DOB: [birth year]  Phone: [phone] (home)       All above HIPAA information was verified with patient.     Was a Nurse, learning disability used for this call? No    Completed refill call assessment today to schedule patient's medication shipment from the [facility] [phone]).  All relevant notes have been reviewed.     Specialty medication(s) and dose(s) confirmed: Regimen is correct and unchanged.   Changes to medications: Fujiko reports no changes at this time.  Changes to insurance: No  New side effects reported not previously addressed with a pharmacist or physician: None reported  Questions for the pharmacist: No    Confirmed patient received a Conservation officer, historic buildings and a Surveyor, mining with first shipment. The patient will receive a drug information handout for each medication shipped and additional FDA Medication Guides as required.       DISEASE/MEDICATION-SPECIFIC INFORMATION        For patients on injectable medications: Patient currently has 1 doses left.  Next injection is scheduled for 05/29/2021.    SPECIALTY MEDICATION ADHERENCE     Medication Adherence    Patient reported X missed doses in the last month: 0  Specialty Medication: Humira CF 80 mg/0.8 ml  Patient is on additional specialty medications: No  Any gaps in refill history greater than 2 weeks in the last 3 months: no  Demonstrates understanding of importance of adherence: yes  Informant: patient  Reliability of informant: reliable  Confirmed plan for next specialty medication refill: delivery by pharmacy  Refills needed for supportive medications: not needed       Were doses missed due to medication being on hold? No    REFERRAL TO PHARMACIST     Referral to the pharmacist: Not needed      [facility]     Shipping address confirmed in Epic.     Delivery Scheduled: Yes, Expected medication delivery date: 05/29/2021.     Medication will be delivered via UPS to the prescription address in Epic WAM.    Teyla Skidgel D Eirene Rather   [facility] Shared [facility] Specialty Technician

## 2021-05-28 MED FILL — HUMIRA(CF) PEN 80 MG/0.8 ML SUBCUTANEOUS KIT: 28 days supply | Qty: 4 | Fill #3

## 2021-06-15 ENCOUNTER — Other Ambulatory Visit: Payer: Self-pay

## 2021-06-15 ENCOUNTER — Ambulatory Visit
Admission: EM | Admit: 2021-06-15 | Discharge: 2021-06-15 | Disposition: A | Discharge status: Home / Self Care | Payer: Medicare Other | Attending: Physician Assistant | Admitting: Physician Assistant

## 2021-06-15 DIAGNOSIS — J029 Acute pharyngitis, unspecified: Secondary | ICD-10-CM | POA: Insufficient documentation

## 2021-06-15 LAB — POCT RAPID STREP A (OFFICE): Rapid Strep A Screen: NEGATIVE

## 2021-06-15 MED ORDER — PREDNISONE 20 MG PO TABS: 40.0000 mg | ORAL_TABLET | Freq: Every day | ORAL | 0 refills | Status: AC

## 2021-06-15 NOTE — ED Triage Notes (Signed)
2 day h/o sore throat with dysphagia.  Denies cough and congestion. Has been taking leftover amoxicillin w/o relief. No abdominal pain, v/d.

## 2021-06-15 NOTE — ED Provider Notes (Signed)
[name]    CSN: [id number] Arrival date & time: 06/15/21  0949      History   Chief Complaint Chief Complaint  Patient presents with   Sore Throat    HPI Bailey Mcdaniel is a 50 y.o. female.   Patient here today for evaluation of sore throat, and difficulty swallowing that started a few days ago.  She states she has been trying to take amoxicillin that she had leftover without significant relief.  She has had some bilateral ear pain as well.  She denies any cough.  She has not had any fever.  Denies any nausea, vomiting or diarrhea.  The history is provided by the patient.  Sore Throat Pertinent negatives include no abdominal pain and no shortness of breath.   Past Medical History:  Diagnosis Date   Anxiety and depression    Asthma    Back pain    s/p surgery 8-12   Migraine    PONV (postoperative nausea and vomiting)     Patient Active Problem List   Diagnosis Date Noted   Seasonal and perennial allergic rhinitis 09/24/2020   Dysfunction of both eustachian tubes 06/24/2020   Mixed hyperlipidemia 06/06/2020   Cough 05/02/2020   Family history of early CAD 04/11/2020   Tobacco dependence 04/11/2020   Angina pectoris (HCC) 04/11/2020   Seasonal allergic conjunctivitis 07/19/2019   Tobacco user 06/21/2019   Asthma-COPD overlap syndrome (HCC) 06/21/2019   Hydradenitis 07/07/2016   Back pain 08/26/2011   Anxiety and depression 08/26/2011    Past Surgical History:  Procedure Laterality Date   ABDOMINAL HYSTERECTOMY     BACK SURGERY  03/28/2011   BACK SURGERY     BLADDER SURGERY     at time of hysterectomy   HAND SURGERY  2009   R hand, due to a Fx    HAND SURGERY     hsyterectomy and unilateral oophorectomy per patient     OPEN REDUCTION INTERNAL FIXATION (ORIF) PROXIMAL PHALANX Left 04/19/2020   Procedure: OPEN TREATMENT OF LEFT SMALL FINGER FRACTURE;  Surgeon: Mack Hook, MD;  Location: [facility] OR;  Service: Orthopedics;  Laterality:  Left;  LENGTH OF SURGERY: 75 MIN  PRE OP BLOCK   TYMPANOSTOMY TUBE PLACEMENT Bilateral 06/20/2020    OB History     Gravida  3   Para  3   Term  0   Preterm  0   AB  0   Living  3      SAB  0   IAB  0   Ectopic  0   Multiple      Live Births               Home Medications    Prior to Admission medications   Medication Sig Start Date End Date Taking? Authorizing Provider  predniSONE (DELTASONE) 20 MG tablet Take 2 tablets (40 mg total) by mouth daily with breakfast for 5 days. 06/15/21 06/20/21 Yes Tomi Bamberger, PA-C  Adalimumab [facility] PEN-CD/UC/HS STARTER) 80 MG/0.8ML PNKT Inject the contents of 1 pen (80mg ) under the skin weekly. 01/10/21   [provider]  albuterol (VENTOLIN HFA) 108 (90 Base) MCG/ACT inhaler Inhale 2 puffs into the lungs every 4 (four) hours as needed for wheezing or shortness of breath. 03/13/21   05/13/21, FNP  atorvastatin (LIPITOR) 20 MG tablet Take 1 tablet (20 mg total) by mouth daily. 06/06/20   Patwardhan, 13/4/21, MD  azelastine (ASTELIN) 0.1 %  nasal spray Take 1-2 sprays per nostril twice a day as needed for drainage. 06/21/19   Ellamae Sia, DO  b complex vitamins tablet Take 1 tablet by mouth daily.     [provider]  Carbinoxamine Maleate 4 MG TABS Take 2 tablets (8 mg total) by mouth in the morning and at bedtime. 04/10/21   Hetty Blend, FNP  Cholecalciferol (PA VITAMIN D-3) 50 MCG (2000 UT) CAPS Take 2,000 Units by mouth daily.     [provider]  clindamycin (CLEOCIN) 300 MG capsule Take 300 mg by mouth 2 (two) times daily. Patient not taking: Reported on 04/10/2021 09/11/20   [provider]  clonazePAM (KLONOPIN) 1 MG tablet Take 1 mg by mouth daily.    [provider]  fluticasone-salmeterol (ADVAIR HFA) 230-21 MCG/ACT inhaler Inhale 2 puffs into the lungs 2 (two) times daily. Patient taking differently: Inhale 2 puffs into the lungs as needed. 03/13/21   Hetty Blend, FNP   metoprolol tartrate (LOPRESSOR) 25 MG tablet Take 1 tablet (25 mg total) by mouth 2 (two) times daily. 04/11/20 04/10/21  Patwardhan, Anabel Bene, MD  Multiple Vitamins-Minerals (EMERGEN-C IMMUNE) PACK Take 1 packet by mouth daily.    [provider]  nitroGLYCERIN (NITROSTAT) 0.4 MG SL tablet Place 1 tablet (0.4 mg total) under the tongue every 5 (five) minutes as needed for chest pain. 04/11/20 07/10/20  Patwardhan, Anabel Bene, MD  Omega-3 1000 MG CAPS Take 1,000 capsules by mouth daily.     [provider]  rifampin (RIFADIN) 300 MG capsule Take 300 mg by mouth 2 (two) times daily. 09/11/20   [provider]  Tiotropium Bromide Monohydrate (SPIRIVA RESPIMAT) 1.25 MCG/ACT AERS Inhale 2 puffs into the lungs daily. Patient taking differently: Inhale 2 puffs into the lungs as needed. 05/02/20   Ambs, Norvel Richards, FNP    Family History Family History  Problem Relation Age of Onset   Sudden death Father    Heart attack Father        F MI at age 47 and 12   Cancer Mother        type?   Hypertension Mother    Stroke Mother    Diabetes Mother    CAD Mother    Colon cancer Neg Hx    Breast cancer Neg Hx     Social History Social History   Tobacco Use   Smoking status: Every Day    Packs/day: 3.00    Years: 33.00    Pack years: 99.00    Types: Cigarettes    Start date: 30   Smokeless tobacco: Never   Tobacco comments:    trying to quit, down to 3-5 cigs a day  Vaping Use   Vaping Use: Never used  Substance Use Topics   Alcohol use: Not Currently   Drug use: No     Allergies   Patient has no known allergies.   Review of Systems Review of Systems  Constitutional:  Negative for chills and fever.  HENT:  Positive for congestion, ear pain, sore throat and trouble swallowing. Negative for sinus pressure.   Eyes:  Negative for discharge and redness.  Respiratory:  Negative for cough, shortness of breath and wheezing.   Gastrointestinal:  Negative for abdominal pain,  diarrhea, nausea and vomiting.    Physical Exam Triage Vital Signs ED Triage Vitals  Enc Vitals Group     BP 06/15/21 1106 111/76     Pulse Rate 06/15/21  1106 76     Resp 06/15/21 1106 18     Temp 06/15/21 1106 97.9 F (36.6 C)     Temp Source 06/15/21 1106 Oral     SpO2 06/15/21 1106 97 %     Weight --      Height --      Head Circumference --      Peak Flow --      Pain Score 06/15/21 1107 10     Pain Loc --      Pain Edu? --      Excl. in GC? --    No data found.  Updated Vital Signs BP 111/76 (BP Location: Left Arm)   Pulse 76   Temp 97.9 F (36.6 C) (Oral)   Resp 18   SpO2 97%      Physical Exam Vitals and nursing note reviewed.  Constitutional:      General: She is not in acute distress.    Appearance: Normal appearance. She is not ill-appearing.  HENT:     Head: Normocephalic and atraumatic.     Right Ear: Tympanic membrane normal.     Left Ear: Tympanic membrane normal.     Nose: No congestion or rhinorrhea.     Mouth/Throat:     Mouth: Mucous membranes are moist.     Pharynx: Posterior oropharyngeal erythema present. No oropharyngeal exudate.     Comments: Hoarse voice noted Eyes:     Conjunctiva/sclera: Conjunctivae normal.  Cardiovascular:     Rate and Rhythm: Normal rate and regular rhythm.     Heart sounds: Normal heart sounds. No murmur heard. Pulmonary:     Effort: Pulmonary effort is normal. No respiratory distress.     Breath sounds: Normal breath sounds. No wheezing, rhonchi or rales.  Skin:    General: Skin is warm and dry.  Neurological:     Mental Status: She is alert.  Psychiatric:        Mood and Affect: Mood normal.        Thought Content: Thought content normal.     UC Treatments / Results  Labs (all labs ordered are listed, but only abnormal results are displayed) Labs Reviewed  CULTURE, GROUP A STREP [facility])  POCT RAPID STREP A (OFFICE)    EKG   Radiology No results found.  Procedures Procedures (including  critical care time)  Medications Ordered in UC Medications - No data to display  Initial Impression / Assessment and Plan / UC Course  I have reviewed the triage vital signs and the nursing notes.  Pertinent labs & imaging results that were available during my care of the patient were reviewed by me and considered in my medical decision making (see chart for details).    Strep test negative. Throat culture ordered. Will trial prednisone for hopeful improvement of symptoms. Recommend evaluation in the ED with any worsening symptoms.   Final Clinical Impressions(s) / UC Diagnoses   Final diagnoses:  Acute pharyngitis, unspecified etiology     Discharge Instructions      Please report to ED for evaluation if symptoms do not improve or worsen in any way.      ED Prescriptions     Medication Sig Dispense Auth. Provider   predniSONE (DELTASONE) 20 MG tablet Take 2 tablets (40 mg total) by mouth daily with breakfast for 5 days. 10 tablet Tomi Bamberger, PA-C      PDMP not reviewed this encounter.   Tomi Bamberger, PA-C 06/15/21  1225  

## 2021-06-15 NOTE — Discharge Instructions (Signed)
Please report to ED for evaluation if symptoms do not improve or worsen in any way.

## 2021-06-19 LAB — CULTURE, GROUP A STREP (THRC)

## 2021-06-20 DIAGNOSIS — Z79899 Other long term (current) drug therapy: Principal | ICD-10-CM

## 2021-06-20 DIAGNOSIS — L732 Hidradenitis suppurativa: Principal | ICD-10-CM

## 2021-06-20 MED ORDER — HUMIRA(CF) PEN 80 MG/0.8 ML SUBCUTANEOUS KIT
SUBCUTANEOUS | 3 refills | 0.00000 days | Status: CP
Start: 2021-06-20 — End: ?
  Filled 2021-07-07: qty 4, 28d supply, fill #0

## 2021-07-01 NOTE — Unmapped (Signed)
[facility] Specialty Pharmacy Refill Coordination Note    Specialty Medication(s) to be Shipped:   Inflammatory Disorders: Humira    Other medication(s) to be shipped: No additional medications requested for fill at this time     Krista Williamson, DOB: [birth year]  Phone: [phone] (home)       All above HIPAA information was verified with patient.     Was a Nurse, learning disability used for this call? No    Completed refill call assessment today to schedule patient's medication shipment from the [facility] [phone]).  All relevant notes have been reviewed.     Specialty medication(s) and dose(s) confirmed: Regimen is correct and unchanged.   Changes to medications: Krista Williamson reports no changes at this time.  Changes to insurance: No  New side effects reported not previously addressed with a pharmacist or physician: None reported  Questions for the pharmacist: No    Confirmed patient received a Conservation officer, historic buildings and a Surveyor, mining with first shipment. The patient will receive a drug information handout for each medication shipped and additional FDA Medication Guides as required.       DISEASE/MEDICATION-SPECIFIC INFORMATION        For patients on injectable medications: Patient currently has 1 doses left.  Next injection is scheduled for 07/03/2021-patient stated she will probably miss this dose due to being at the hospital with her son.    SPECIALTY MEDICATION ADHERENCE     Medication Adherence    Patient reported X missed doses in the last month: 1  Specialty Medication: Humira CF 80 mg/0.8 ml-patient's son is in Hospital and she is staying there with him and has not been home since Thanksgiving  Patient is on additional specialty medications: No  Any gaps in refill history greater than 2 weeks in the last 3 months: no  Demonstrates understanding of importance of adherence: yes  Informant: patient  Reliability of informant: reliable  Confirmed plan for next specialty medication refill: delivery by pharmacy  Refills needed for supportive medications: not needed       Were doses missed due to medication being on hold? No    REFERRAL TO PHARMACIST     Referral to the pharmacist: Not needed      [facility]     Shipping address confirmed in Epic.     Delivery Scheduled: Yes, Expected medication delivery date: 07/08/2021.     Medication will be delivered via UPS to the prescription address in Epic WAM.    Krista Williamson D Krista Williamson   [facility] Shared [facility] Specialty Technician

## 2021-07-31 NOTE — Unmapped (Signed)
The [facility] has made a second and final attempt to reach this patient to refill the following medication:Humira.      We have left voicemails on the following phone numbers: [phone].*Sent texts on 12/23 and 12/29*    Dates contacted: 12/23 and 12/29  Last scheduled delivery: 12/6    The patient may be at risk of non-compliance with this medication. The patient should call the [facility] at [phone]  Option 4, then Option 2 (all other specialty patients) to refill medication.    Pantelis Elgersma D Administrator Shared [facility] Specialty Technician

## 2021-07-31 NOTE — Unmapped (Signed)
[facility] Specialty Pharmacy Refill Coordination Note    Specialty Medication(s) to be Shipped:   Inflammatory Disorders: Humira    Other medication(s) to be shipped: No additional medications requested for fill at this time     Krista Williamson, DOB: [birth year]  Phone: [phone] (home)       All above HIPAA information was verified with patient.     Was a Nurse, learning disability used for this call? No    Completed refill call assessment today to schedule patient's medication shipment from the [facility] [phone]).  All relevant notes have been reviewed.     Specialty medication(s) and dose(s) confirmed: Regimen is correct and unchanged.   Changes to medications: Jassmin reports no changes at this time.  Changes to insurance: No  New side effects reported not previously addressed with a pharmacist or physician: None reported  Questions for the pharmacist: No    Confirmed patient received a Conservation officer, historic buildings and a Surveyor, mining with first shipment. The patient will receive a drug information handout for each medication shipped and additional FDA Medication Guides as required.       DISEASE/MEDICATION-SPECIFIC INFORMATION        For patients on injectable medications: Patient currently has 1 doses left.  Next injection is scheduled for 07/31/2021.    SPECIALTY MEDICATION ADHERENCE     Medication Adherence    Patient reported X missed doses in the last month: 0  Specialty Medication: Humira  Patient is on additional specialty medications: No  Any gaps in refill history greater than 2 weeks in the last 3 months: no  Demonstrates understanding of importance of adherence: yes  Informant: patient  Reliability of informant: reliable  Confirmed plan for next specialty medication refill: delivery by pharmacy  Refills needed for supportive medications: not needed       Were doses missed due to medication being on hold? No    REFERRAL TO PHARMACIST     Referral to the pharmacist: Not needed      [facility]     Shipping address confirmed in Epic.     Delivery Scheduled: Yes, Expected medication delivery date: 08/07/2021.     Medication will be delivered via UPS to the prescription address in Epic WAM.    Gabriela Irigoyen D Brien Lowe   [facility] Shared [facility] Specialty Technician

## 2021-08-05 DIAGNOSIS — Z79899 Other long term (current) drug therapy: Principal | ICD-10-CM

## 2021-08-05 DIAGNOSIS — L732 Hidradenitis suppurativa: Principal | ICD-10-CM

## 2021-08-06 NOTE — Unmapped (Signed)
Krista Williamson 's Humira pen 80mg /0.79ml shipment will be delayed as a result of prior authorization being required by the patient's insurance.     I have reached out to the patient  at [phone] and communicated the delay. We will call the patient back to reschedule the delivery upon resolution. We have not confirmed the new delivery date.

## 2021-08-22 MED ORDER — ADALIMUMAB 80 MG/0.8 ML SUBCUTANEOUS PEN KIT
1 refills | 0 days
Start: 2021-08-22 — End: ?

## 2021-08-22 NOTE — Unmapped (Signed)
Clinical Assessment Needed For: Dose Change  Medication: Humira pen 80mg /0.66ml  Last Fill Date/Day Supply: 07/07/21 / 28 days  Copay $0  Was previous dose already scheduled to fill: No    Notes to Pharmacist: Provider ok with q14 days while we appeal q7 days.

## 2021-08-22 NOTE — Unmapped (Signed)
Krista Williamson 's Humira pen 80mg  weekly dosing shipment will be sent out  as a result of prior authorization now approved.     I have reached out to the patient  at [phone] and communicated the delivery change. We will reschedule the medication for the delivery date that the patient agreed upon.  We have confirmed the delivery date as 1/24 via UPS    *Pt aware weekly dosing was approved. This is the same dose she was on previously.

## 2021-08-25 MED FILL — HUMIRA(CF) PEN 80 MG/0.8 ML SUBCUTANEOUS KIT: 28 days supply | Qty: 4 | Fill #1

## 2021-08-30 NOTE — Unmapped (Unsigned)
Please disregard - encounter created in error. 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 At last visit, patient received ILK-40 injection for a prurigo nodule on the left popliteal fossa. She was additionally recommended to continue Humira 80 mg subcutaneous weekly as well as clindaycin/rifampin 300 mg BID as needed for flares.     ***    The patient denies any other new or changing lesions or areas of concern.     Pertinent Past Medical History     HS  Disease course:  Year when symptoms first noticed: 86  Year of diagnosis: 1999  Location of first symptoms: groin and buttocks  Typical involved areas include: groin and buttocks  Typical number of inflammatory lesions each month at baseline (from first visit): 1-3  Disease triggers: none noted  ??  Prior treatments:  Topical: none  Systemic: adalimumab 80mg  weekly (increased spring 2019), clinda/rifampin, Augmentin, Keflex, doxycycline, Bactrim  Past surgical procedures: unroofing procedure in the bilateral inguinal folds in 04/2018 with excellent improvement in those areas.   Past laser procedures: none    Past Medical History, Family History, Social History, Medication List, Allergies, and Problem List were reviewed in the rooming section of Epic.     ROS: Other than symptoms mentioned in the HPI, no fevers, chills, or other skin complaints    Physical Examination     GENERAL: Well-appearing female in no acute distress, resting comfortably.  NEURO: Alert and oriented, answers questions appropriately  PSYCH: Normal mood and affect  {PE extent:75514}  {PE list:75421}  ***    All areas not commented on are within normal limits or unremarkable    Scribe's Attestation: Elsie Stain, MD obtained and performed the history, physical exam and medical decision making elements that were entered into the chart.  Signed by Hazle Nordmann, Scribe, on ***.    {*** NOTE TO PROVIDER: PLEASE ADD ATTESTATION NOTING YOU AGREE WITH SCRIBE DOCUMENTATION}       (Approved Template 04/15/2020)

## 2021-09-01 ENCOUNTER — Ambulatory Visit: Admit: 2021-09-01 | Discharge: 2021-09-01 | Payer: MEDICARE

## 2021-09-01 DIAGNOSIS — Z79899 Other long term (current) drug therapy: Principal | ICD-10-CM

## 2021-09-01 DIAGNOSIS — N6001 Solitary cyst of right breast: Principal | ICD-10-CM

## 2021-09-01 DIAGNOSIS — L732 Hidradenitis suppurativa: Principal | ICD-10-CM

## 2021-09-01 MED ORDER — RIFAMPIN 300 MG CAPSULE
ORAL_CAPSULE | Freq: Two times a day (BID) | ORAL | 2 refills | 30.00000 days | Status: CP
Start: 2021-09-01 — End: ?

## 2021-09-01 MED ORDER — CEPHALEXIN 500 MG CAPSULE
Freq: Two times a day (BID) | ORAL | 0 refills | 0.00000 days | Status: CN
Start: 2021-09-01 — End: ?

## 2021-09-01 MED ORDER — CLINDAMYCIN HCL 300 MG CAPSULE
ORAL_CAPSULE | 2 refills | 0.00000 days | Status: CP
Start: 2021-09-01 — End: ?

## 2021-09-01 NOTE — Unmapped (Addendum)
Please have your labs done at [facility]    [address] [facility], [place], [place], [place] [address]      Continue humira injections weekly  Continue clindamycin/rifampin twice daily when needed

## 2021-09-01 NOTE — Unmapped (Signed)
Self-reported severity (0-5): 2  VAS pain today: 0  VAS average pain for the last month: 0  Requiring pain medication? Yes.  If so, what type/frequency? Ibuprofen  How often in pain?  less than montly  Level of odor (0-5): 1  Level of itching (0-5): 2  Dressing changes needed for drainage:No drainage/less than once weekly  How much drainage: no drainage  Flare in the last month (Y/N)? No.  How long ago was the last flare? in the last year  Developing new lesions? less than monthly  Number of inflammatory lesions montly: 5-10  DLQI: 4  Current treatment: -     How helpful is the current treatment in managing the following aspects of your disease?  Not at all helpful Somewhat helpful Very helpful   Pain  x    Decreasing length of flares  x    Decreasing new lesions  x    Drainage  x    Decreasing frequency of flares  x    Decreasing severity of flares  x    Odor  x

## 2021-09-01 NOTE — Unmapped (Signed)
Dermatology Note     Assessment and Plan:       Hidradenitis suppurativa - stable  - Diagnosis, natural course, and treatment options were discussed with the patient.   - Continue humira 80mg  subcutaneous weekly   - Continue clindamycin/rifampin 300mg  BID prn flares. Refilled today.    Right breast with nipple discharge.  Possible there is a small benign cyst present with contents that exits with pressure, but it's a bit of an atypical history and location.  Would be reasonable to see a breast specialist to assure nothing else is going on.    - While it's possible patient has benign cysts, given mammogram results, it is reasonable to be evaluated by a breast specialist and we recommended that she do so  ??  High risk medication use, humira  - Quantiferon gold negative 08/2020, reordered today  ??  The patient was advised to call for an appointment should any new, changing, or symptomatic lesions develop.     RTC: Return in about 1 year (around 09/01/2022) for follow up of HS. or sooner as needed   _________________________________________________________________      Chief Complaint     Chief Complaint   Patient presents with   ??? Hidradenitis     Pt states no current flares.   Concerned about right nipple does drain. Was informed to tell derm doctor.        HPI     Krista Williamson is a 51 y.o. female who presents as a returning patient (last seen 04/14/2021) to [facility] for follow up of prurigo nodularis and HS. At last visit, patient received ILK-40 injection for a prurigo nodule on the left popliteal fossa. She was additionally recommended to continue Humira 80 mg subcutaneous weekly as well as clindaycin/rifampin 300 mg BID as needed for flares.     Today, patient reports that she had one flare in the groin area that drained since her last visit to clinic. Patient denies side effects of any kind on Humira and reports improvement on the medication.    Patient reports that when she had mammogram, she was told to inquire about her right breast as there was a mass and superficial scabbing. She reports having a recurring scab on her right nipple for 7-8 months and when she scratches the scab and squeezes there is a thick pus that comes out.    Self-reported severity (0-5): 2  VAS pain today: 0  VAS average pain for the last month: 0  Requiring pain medication? Yes.  If so, what type/frequency? Ibuprofen  How often in pain?  less than montly  Level of odor (0-5): 1  Level of itching (0-5): 2  Dressing changes needed for drainage:No drainage/less than once weekly  How much drainage: no drainage  Flare in the last month (Y/N)? No.  How long ago was the last flare? in the last year  Developing new lesions? less than monthly  Number of inflammatory lesions montly: 5-10  DLQI: 4  Current treatment: humira, clindamycin/rifampin     How helpful is the current treatment in managing the following aspects of your disease?  Not at all helpful Somewhat helpful Very helpful   Pain ?? x ??   Decreasing length of flares ?? x ??   Decreasing new lesions ?? x ??   Drainage ?? x ??   Decreasing frequency of flares ?? x ??   Decreasing severity of flares ?? x ??   Odor ?? x ??  The patient denies any other new or changing lesions or areas of concern.     Pertinent Past Medical History     HS  Disease course:  Year when symptoms first noticed: 39  Year of diagnosis: 1999  Location of first symptoms: groin and buttocks  Typical involved areas include: groin and buttocks  Typical number of inflammatory lesions each month at baseline (from first visit): 1-3  Disease triggers: none noted  ??  Prior treatments:  Topical: none  Systemic: adalimumab 80mg  weekly (increased spring 2019), clinda/rifampin, Augmentin, Keflex, doxycycline, Bactrim  Past surgical procedures: unroofing procedure in the bilateral inguinal folds in 04/2018??with excellent improvement in those areas.   Past laser procedures: none  ??  Past Medical History, Family History, Social History, Medication List, Allergies, and Problem List were reviewed in the rooming section of Epic.   ??  ROS: Other than symptoms mentioned in the HPI, no fevers, chills, or other skin complaints      Physical Examination     GENERAL: Well-appearing female in no acute distress, resting comfortably.  NEURO: Alert and oriented, answers questions appropriately  PSYCH: Normal mood and affect   SKIN: Examination of the genital region and right breast was performed  - Few scattered scars and comedones in inguinal folds  - Right nipple with few 1-36mm white papules, in the middle of nipple is a small crust    All areas not commented on are within normal limits or unremarkable     Scribe's Attestation: Elsie Stain, MD obtained and performed the history, physical exam and medical decision making elements that were entered into the chart.  Signed by Laurina Bustle, Scribe, on September 01, 2021 1:13 PM  .    ----------------------------------------------------------------------------------------------------------------------  September 01, 2021 9:18 PM. Documentation assistance provided by the Scribe. I was present during the time the encounter was recorded. The information recorded by the Scribe was done at my direction and has been reviewed and validated by me.  ----------------------------------------------------------------------------------------------------------------------       (Approved Template 04/15/2020)

## 2021-09-03 LAB — QUANTIFERON TB GOLD PLUS
QUANTIFERON ANTIGEN 1 MINUS NIL: -0.02 [IU]/mL
QUANTIFERON ANTIGEN 2 MINUS NIL: 0 [IU]/mL
QUANTIFERON MITOGEN: 9.88 [IU]/mL
QUANTIFERON TB GOLD PLUS: NEGATIVE
QUANTIFERON TB NIL VALUE: 0.12 [IU]/mL

## 2021-09-03 LAB — TB AG1: TB AG1 VALUE: 0.1

## 2021-09-03 LAB — TB MITOGEN: TB MITOGEN VALUE: 10

## 2021-09-03 LAB — TB NIL: TB NIL VALUE: 0.12

## 2021-09-03 LAB — TB AG2: TB AG2 VALUE: 0.12

## 2021-09-12 NOTE — Unmapped (Signed)
[facility] Specialty Pharmacy Refill Coordination Note    Specialty Medication(s) to be Shipped:   Inflammatory Disorders: Humira    Other medication(s) to be shipped: No additional medications requested for fill at this time     Krista Williamson, DOB: 10-Jun-1971  Phone: [phone] (home)       All above HIPAA information was verified with patient.     Was a Nurse, learning disability used for this call? No    Completed refill call assessment today to schedule patient's medication shipment from the [facility] [phone]).  All relevant notes have been reviewed.     Specialty medication(s) and dose(s) confirmed: Regimen is correct and unchanged.   Changes to medications: Krista Williamson reports no changes at this time.  Changes to insurance: No  New side effects reported not previously addressed with a pharmacist or physician: None reported  Questions for the pharmacist: No    Confirmed patient received a Conservation officer, historic buildings and a Surveyor, mining with first shipment. The patient will receive a drug information handout for each medication shipped and additional FDA Medication Guides as required.       DISEASE/MEDICATION-SPECIFIC INFORMATION        For patients on injectable medications: Patient currently has 1 doses left.  Next injection is scheduled for 09/16/2021.    SPECIALTY MEDICATION ADHERENCE     Medication Adherence    Patient reported X missed doses in the last month: 0  Specialty Medication: Humira  Patient is on additional specialty medications: No  Any gaps in refill history greater than 2 weeks in the last 3 months: no  Demonstrates understanding of importance of adherence: yes  Informant: patient  Reliability of informant: reliable  Confirmed plan for next specialty medication refill: delivery by pharmacy  Refills needed for supportive medications: not needed       Were doses missed due to medication being on hold? No    REFERRAL TO PHARMACIST     Referral to the pharmacist: Not needed      [facility]     Shipping address confirmed in Epic.     Delivery Scheduled: Yes, Expected medication delivery date: 09/18/2021.     Medication will be delivered via UPS to the prescription address in Epic WAM.    Deanne Bedgood D Corrinne Benegas   [facility] Specialty Technician

## 2021-09-17 MED FILL — HUMIRA(CF) PEN 80 MG/0.8 ML SUBCUTANEOUS KIT: 28 days supply | Qty: 4 | Fill #2

## 2021-10-06 NOTE — Unmapped (Signed)
[facility] Specialty Pharmacy Refill Coordination Note    Specialty Medication(s) to be Shipped:   Inflammatory Disorders: Humira    Other medication(s) to be shipped: No additional medications requested for fill at this time     Krista Williamson, DOB: [birth year]  Phone: [phone] (home)       All above HIPAA information was verified with patient.     Was a Nurse, learning disability used for this call? No    Completed refill call assessment today to schedule patient's medication shipment from the [facility] [phone]).  All relevant notes have been reviewed.     Specialty medication(s) and dose(s) confirmed: Regimen is correct and unchanged.   Changes to medications: Nikeria reports no changes at this time.  Changes to insurance: No  New side effects reported not previously addressed with a pharmacist or physician: None reported  Questions for the pharmacist: No    Confirmed patient received a Conservation officer, historic buildings and a Surveyor, mining with first shipment. The patient will receive a drug information handout for each medication shipped and additional FDA Medication Guides as required.       DISEASE/MEDICATION-SPECIFIC INFORMATION        For patients on injectable medications: Patient currently has 2 doses left.  Next injection is scheduled for 10/07/2021.    SPECIALTY MEDICATION ADHERENCE     Medication Adherence    Patient reported X missed doses in the last month: 0  Specialty Medication: Humira  Patient is on additional specialty medications: No  Any gaps in refill history greater than 2 weeks in the last 3 months: no  Demonstrates understanding of importance of adherence: yes  Informant: patient  Reliability of informant: reliable  Confirmed plan for next specialty medication refill: delivery by pharmacy  Refills needed for supportive medications: not needed       Were doses missed due to medication being on hold? No    REFERRAL TO PHARMACIST     Referral to the pharmacist: Not needed      [facility]     Shipping address confirmed in Epic.     Delivery Scheduled: Yes, Expected medication delivery date: 10/14/2021.     Medication will be delivered via UPS to the prescription address in Epic WAM.    Beth Goodlin D Hayato Guaman   [facility] Specialty Technician

## 2021-10-13 MED FILL — HUMIRA(CF) PEN 80 MG/0.8 ML SUBCUTANEOUS KIT: 28 days supply | Qty: 4 | Fill #3

## 2021-10-30 IMAGING — RF DG C-ARM 1-60 MIN
1 series · 3 of 3 positions shown · non-contrast
Comparison: None.

CLINICAL DATA: Intraop views phalanx fracture

EXAM:
LEFT LITTLE FINGER 2+V; DG C-ARM 1-60 MIN

[Series 1: run · 3 of 3 slices shown]
[im 1/3]
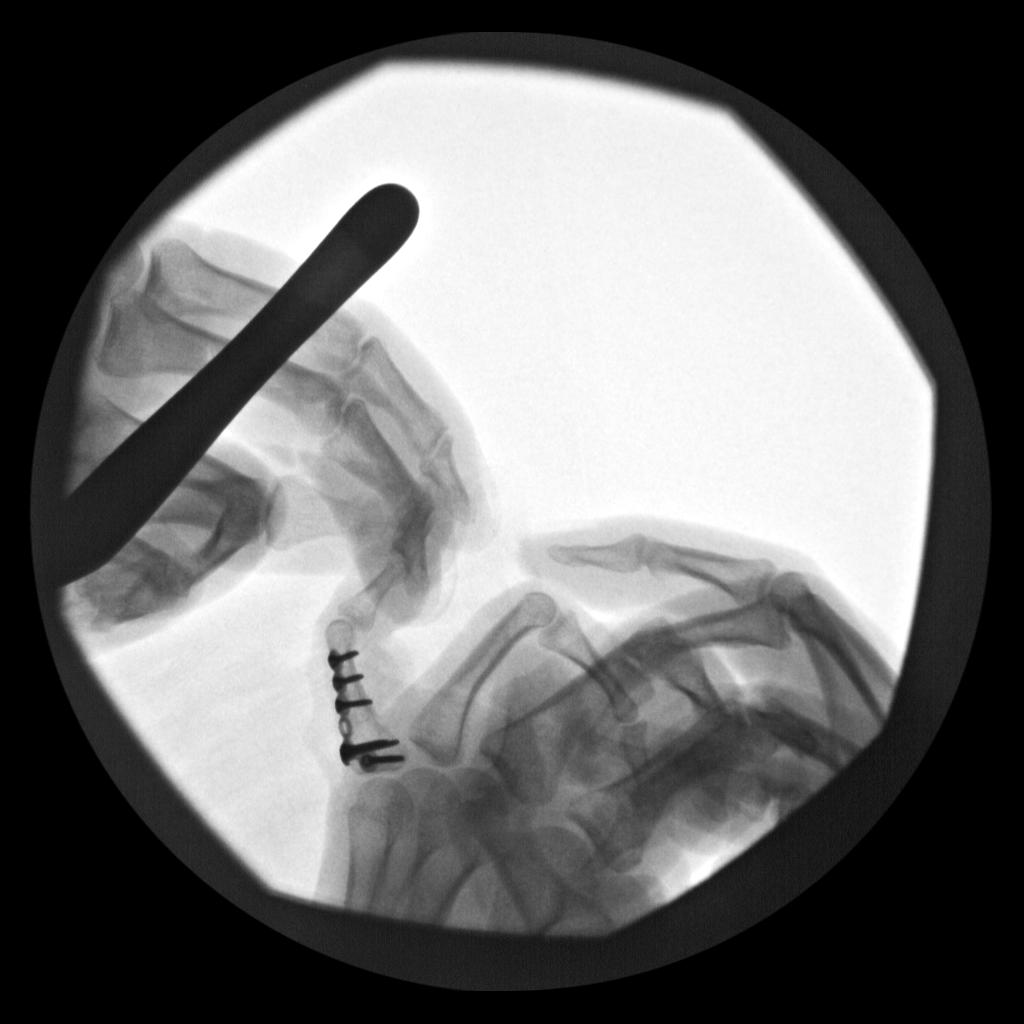
[im 2/3]
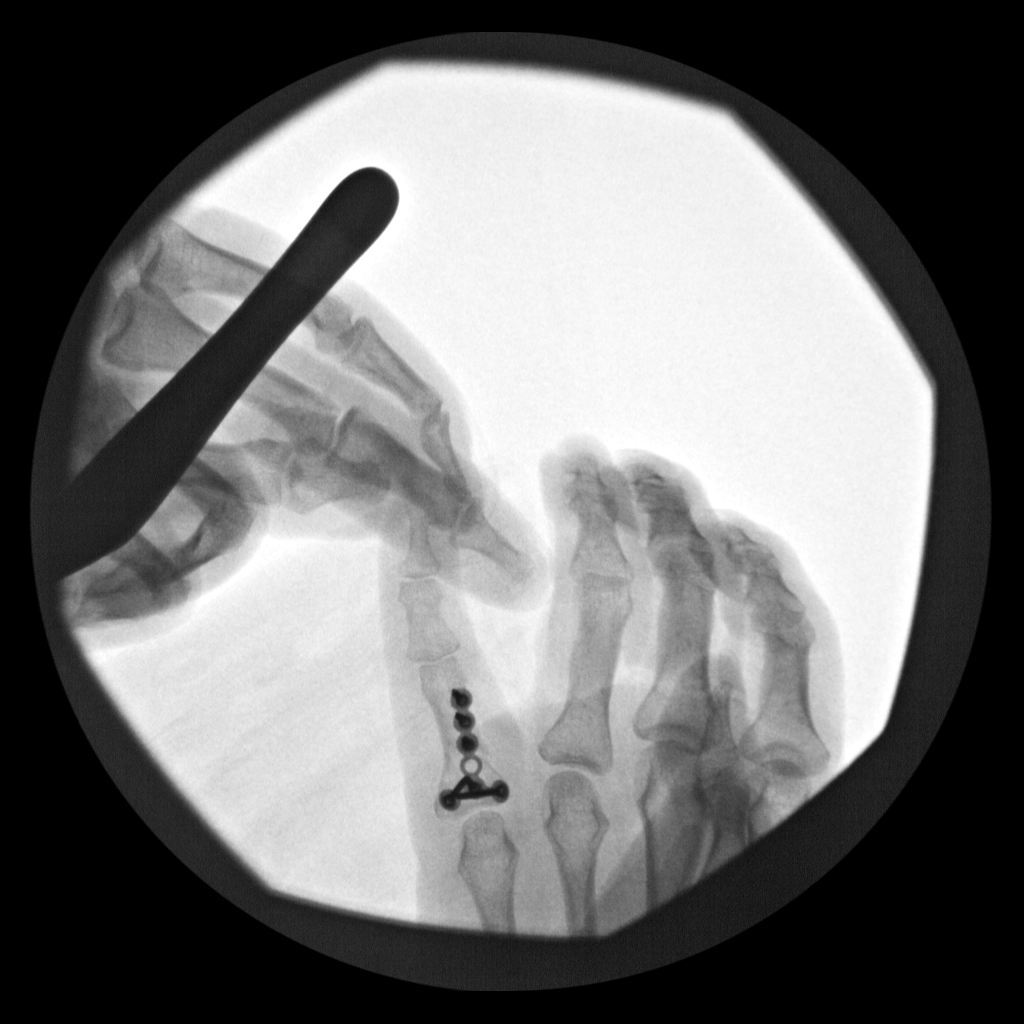
[im 3/3]
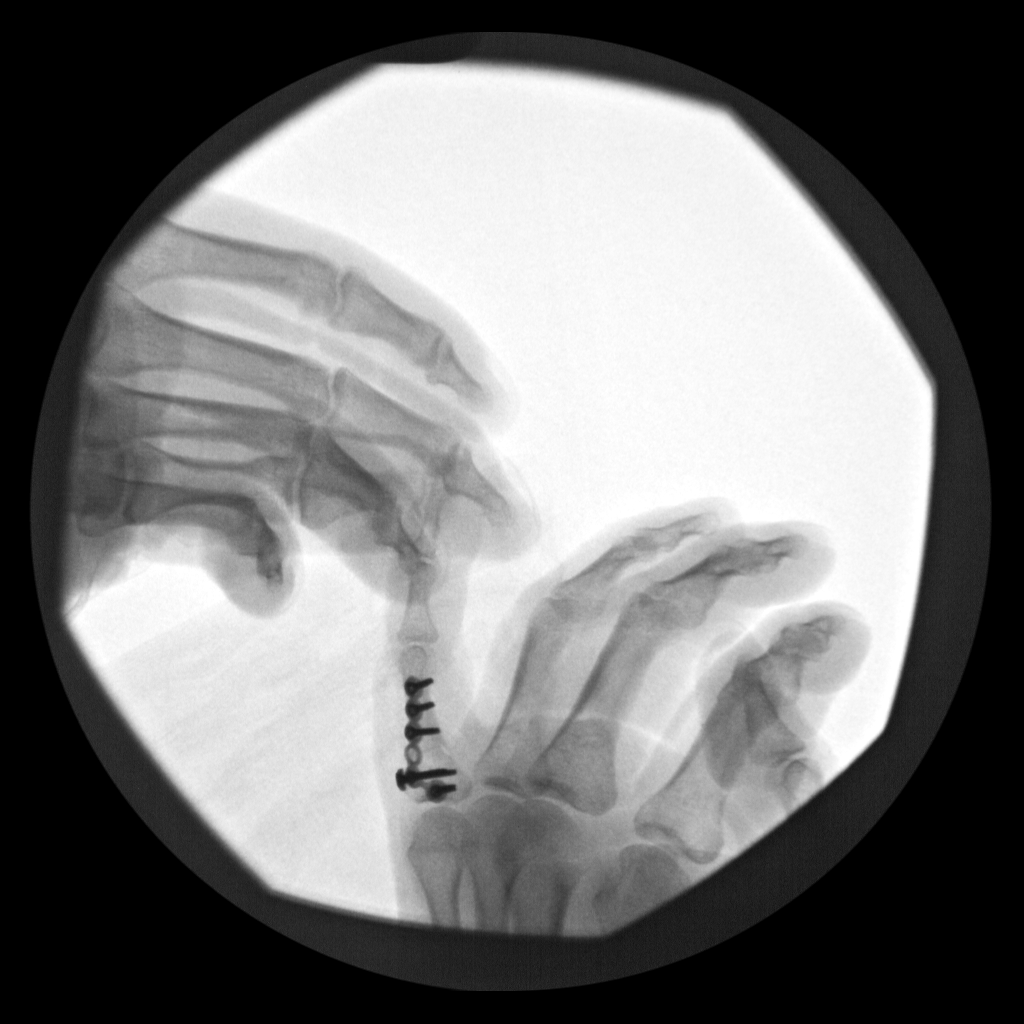

[3 of 3 positions shown; findings below may reference images not displayed]

FINDINGS: 3 intraop views were submitted of plate screw fixation of proximal
fifth phalanx fracture. Eight fluoro time 13 seconds
IMPRESSION: Status post ORIF of proximal fifth phalanx fracture.

## 2021-11-04 DIAGNOSIS — L732 Hidradenitis suppurativa: Principal | ICD-10-CM

## 2021-11-04 DIAGNOSIS — Z79899 Other long term (current) drug therapy: Principal | ICD-10-CM

## 2021-11-04 MED ORDER — HUMIRA(CF) PEN 80 MG/0.8 ML SUBCUTANEOUS KIT
SUBCUTANEOUS | 3 refills | 0.00000 days | Status: CP
Start: 2021-11-04 — End: ?
  Filled 2021-11-10: qty 4, 28d supply, fill #0

## 2021-11-05 NOTE — Unmapped (Signed)
[facility] Specialty Pharmacy Refill Coordination Note    Specialty Medication(s) to be Shipped:   Inflammatory Disorders: Humira    Other medication(s) to be shipped: No additional medications requested for fill at this time     Krista Williamson, DOB: February 24, 1971  Phone: [phone] (home)       All above HIPAA information was verified with patient.     Was a Nurse, learning disability used for this call? No    Completed refill call assessment today to schedule patient's medication shipment from the [facility] [phone]).  All relevant notes have been reviewed.     Specialty medication(s) and dose(s) confirmed: Regimen is correct and unchanged.   Changes to medications: Chemika reports no changes at this time.  Changes to insurance: No  New side effects reported not previously addressed with a pharmacist or physician: None reported  Questions for the pharmacist: No    Confirmed patient received a Conservation officer, historic buildings and a Surveyor, mining with first shipment. The patient will receive a drug information handout for each medication shipped and additional FDA Medication Guides as required.       DISEASE/MEDICATION-SPECIFIC INFORMATION        For patients on injectable medications: Patient currently has 1 doses left.  Next injection is scheduled for 11/11/2021.    SPECIALTY MEDICATION ADHERENCE     Medication Adherence    Patient reported X missed doses in the last month: 0  Specialty Medication: Humira  Patient is on additional specialty medications: No  Any gaps in refill history greater than 2 weeks in the last 3 months: no  Demonstrates understanding of importance of adherence: yes  Informant: patient  Reliability of informant: reliable  Confirmed plan for next specialty medication refill: delivery by pharmacy  Refills needed for supportive medications: not needed       Were doses missed due to medication being on hold? No    REFERRAL TO PHARMACIST     Referral to the pharmacist: Not needed      [facility]     Shipping address confirmed in Epic.     Delivery Scheduled: Yes, Expected medication delivery date: 11/11/2021.     Medication will be delivered via UPS to the prescription address in Epic WAM.    Teleshia Lemere D Clarise Chacko   [facility] Specialty Technician

## 2021-12-02 NOTE — Unmapped (Signed)
[facility] Special[facility] Pharm[facility] Refill Coordination Note    Specialty Medication(s) to be Shipped:   Inflammatory Disorders: Humira    Other medication(s) to be shipped: No additional medications requested for fill at this time     Krista Williamson, DOB: 09/18/1970  Phone: [phone] (home)       All above HIPAA information was verified with patient.     Was a Nurse, learning disability used for this call? No    Completed refill call assessment today to schedule patient's medication shipment from the [facility] [phone]).  All relevant notes have been reviewed.     Specialty medication(s) and dose(s) confirmed: Regimen is correct and unchanged.   Changes to medications: Krista Williamson reports no changes at this time.  Changes to insurance: No  New side effects reported not previously addressed with a pharmacist or physician: None reported  Questions for the pharmacist: No    Confirmed patient received a Conservation officer, historic buildings and a Surveyor, mining with first shipment. The patient will receive a drug information handout for each medication shipped and additional FDA Medication Guides as required.       DISEASE/MEDICATION-SPECIFIC INFORMATION        For patients on injectable medications: Patient currently has 1 doses left.  Next injection is scheduled for 12/09/2021.    SPECIALTY MEDICATION ADHERENCE     Medication Adherence    Patient reported X missed doses in the last month: 0  Specialty Medication: Humira  Patient is on additional specialty medications: No  Any gaps in refill history greater than 2 weeks in the last 3 months: no  Demonstrates understanding of importance of adherence: yes  Informant: patient  Reliability of informant: reliable  Confirmed plan for next specialty medication refill: delivery by pharmacy  Refills needed for supportive medications: not needed       Were doses missed due to medication being on hold? No    REFERRAL TO PHARMACIST     Referral to the pharmacist: Not needed      [facility]     Shipping address confirmed in Epic.     Delivery Scheduled: Yes, Expected medication delivery date: 12/09/2021.     Medication will be delivered via UPS to the prescription address in Epic WAM.    [name]   [facility] Specialty Technician

## 2021-12-08 MED FILL — HUMIRA(CF) PEN 80 MG/0.8 ML SUBCUTANEOUS KIT: 28 days supply | Qty: 4 | Fill #1

## 2021-12-30 NOTE — Progress Notes (Unsigned)
 Follow up visit  Subjective:   Bailey Mcdaniel, female    DOB: 01/26/1971, 50 y.o.   MRN: [id number]   HPI  No chief complaint on file.   50 y.o. Caucasian female with Crohn's disease, tobacco dependence, family h/o early CAD, atypical angina. Coronary CT angiogram that showed mild noncalcified plaque with calcium score of 0.   Patient was last seen in our office 09/05/2020 by Dr. Patwardhan at which time she was stable from a cardiovascular standpoint and advised to follow-up as needed.  Repeat lipid profile testing after last office visit revealed LDL of 134 and she was advised to start low-dose statin therapy. ***  ***smoking? Taking statin?   She doe snot have any chest pan at this time. Unfortunately, she continues to smoke anywhere from 5 to occasionally up to 20 cigarettes. She just underwent lipid panel testing, results not available to me.    Current Outpatient Medications on File Prior to Visit  Medication Sig Dispense Refill   Adalimumab (HUMIRA PEN-CD/UC/HS STARTER) 80 MG/0.8ML PNKT Inject the contents of 1 pen (80mg) under the skin weekly.     albuterol (VENTOLIN HFA) 108 (90 Base) MCG/ACT inhaler Inhale 2 puffs into the lungs every 4 (four) hours as needed for wheezing or shortness of breath. 18 g 0   atorvastatin (LIPITOR) 20 MG tablet Take 1 tablet (20 mg total) by mouth daily. 90 tablet 2   azelastine (ASTELIN) 0.1 % nasal spray Take 1-2 sprays per nostril twice a day as needed for drainage. 30 mL 5   b complex vitamins tablet Take 1 tablet by mouth daily.      Carbinoxamine Maleate 4 MG TABS Take 2 tablets (8 mg total) by mouth in the morning and at bedtime. 360 tablet 1   Cholecalciferol (PA VITAMIN D-3) 50 MCG (2000 UT) CAPS Take 2,000 Units by mouth daily.      clindamycin (CLEOCIN) 300 MG capsule Take 300 mg by mouth 2 (two) times daily. (Patient not taking: Reported on 04/10/2021)     clonazePAM (KLONOPIN) 1 MG tablet Take 1 mg by mouth daily.      fluticasone-salmeterol (ADVAIR HFA) 230-21 MCG/ACT inhaler Inhale 2 puffs into the lungs 2 (two) times daily. (Patient taking differently: Inhale 2 puffs into the lungs as needed.) 1 each 0   metoprolol tartrate (LOPRESSOR) 25 MG tablet Take 1 tablet (25 mg total) by mouth 2 (two) times daily. 60 tablet 3   Multiple Vitamins-Minerals (EMERGEN-C IMMUNE) PACK Take 1 packet by mouth daily.     nitroGLYCERIN (NITROSTAT) 0.4 MG SL tablet Place 1 tablet (0.4 mg total) under the tongue every 5 (five) minutes as needed for chest pain. 30 tablet 3   Omega-3 1000 MG CAPS Take 1,000 capsules by mouth daily.      rifampin (RIFADIN) 300 MG capsule Take 300 mg by mouth 2 (two) times daily.     Tiotropium Bromide Monohydrate (SPIRIVA RESPIMAT) 1.25 MCG/ACT AERS Inhale 2 puffs into the lungs daily. (Patient taking differently: Inhale 2 puffs into the lungs as needed.) 1 each 2   No current facility-administered medications on file prior to visit.    Cardiovascular & other pertient studies: ***   CTA 05/29/2020: 1. Coronary calcium score of 0. 2. Normal coronary origin with right dominance. 3. CAD-RADS = 2. Mild stenosis in the proximal LAD due to noncalcified plaque.  EKG 04/11/2020: Sinus rhythm 86 bpm  Nonspecific ST-T changes   Treadmill stress test 2016: 10.1 METS, Normal study.     Recent labs:    Latest Ref Rng & Units 09/05/2020    9:16 AM 06/26/2015   12:41 PM 01/03/2015    9:36 PM  CMP  Glucose 65 - 99 mg/dL 86   113   90    BUN 6 - 24 mg/dL 7   7   10    Creatinine 0.57 - 1.00 mg/dL 0.66   0.66   0.55    Sodium 134 - 144 mmol/L 141   139   139    Potassium 3.5 - 5.2 mmol/L 4.2   3.6   3.4    Chloride 96 - 106 mmol/L 102   104   106    CO2 20 - 29 mmol/L 23   25   25    Calcium 8.7 - 10.2 mg/dL 9.4   8.8   8.8    Total Protein 6.5 - 8.1 g/dL  7.1     Total Bilirubin 0.3 - 1.2 mg/dL  0.7     Alkaline Phos 38 - 126 U/L  61     AST 15 - 41 U/L  15     ALT 14 - 54 U/L  17          Latest Ref Rng & Units 06/21/2019    3:12 PM 06/26/2015   12:41 PM 01/03/2015    9:36 PM  CBC  WBC 3.4 - 10.8 x10E3/uL 9.8   10.6   13.2    Hemoglobin 11.1 - 15.9 g/dL 15.3   14.9   14.1    Hematocrit 34.0 - 46.6 % 44.1   43.0   41.4    Platelets 150 - 450 x10E3/uL 261   237   242     Lipid Panel     Component Value Date/Time   CHOL 173 10/04/2020 0933   TRIG 69 10/04/2020 0933   HDL 26 (L) 10/04/2020 0933   CHOLHDL 6.7 (H) 10/04/2020 0933   LDLCALC 134 (H) 10/04/2020 0933   HEMOGLOBIN A1C No results found for: HGBA1C, MPG TSH No results for input(s): TSH in the last 8760 hours.  02/21/2020: Glucose 87, BUN/Cr 13/0.66. EGFR normal. Na/K 140/4.2. Rest of the CMP normal H/H 14/44. MCV 97. Platelets 246 Chol 184, TG 105, HDL 30, LDL 133 TSH 1.87 normal    Review of Systems  Cardiovascular:  Positive for chest pain. Negative for dyspnea on exertion, leg swelling, palpitations and syncope.        There were no vitals filed for this visit.    There is no height or weight on file to calculate BMI. There were no vitals filed for this visit.    Objective:   Physical Exam Vitals and nursing note reviewed.  Constitutional:      General: She is not in acute distress. Neck:     Vascular: No JVD.  Cardiovascular:     Rate and Rhythm: Normal rate and regular rhythm.     Heart sounds: Normal heart sounds. No murmur heard. Pulmonary:     Effort: Pulmonary effort is normal.     Breath sounds: Normal breath sounds. No wheezing or rales.  Musculoskeletal:     Comments: Dressing on left forearm/wrist          Assessment & Recommendations:   50 y.o. Caucasian female with Crohn's disease, tobacco dependence, family h/o early CAD, atypical angina *** Hyperlipidemia:  LDL 134   Chest pain: Noncardiac.  She does have noncalcified mild plaque in proximal mid LAD.    Given her risk factors of family history, tobacco dependence, hyperlipidemia, and continued lipid-lowering  therapy with Lipitor.  Repeat lipid panel results pending. Given mild plaque, aspirin not necessary.  Tobacco dependence:  Re-emphasized importance of tobacco cessation.  Continue f/u w/PCP. I will see her on as needed basis    Nigel Mormon, MD Pager: [phone] Office: [phone]

## 2021-12-31 ENCOUNTER — Ambulatory Visit: Payer: Medicare Other | Admitting: Student

## 2021-12-31 ENCOUNTER — Encounter: Payer: Self-pay | Admitting: Student

## 2021-12-31 VITALS — BP 114/56 | HR 89 | Temp 98.0°F | Resp 16 | Ht 65.0 in | Wt 140.0 lb

## 2021-12-31 DIAGNOSIS — I209 Angina pectoris, unspecified: Secondary | ICD-10-CM

## 2021-12-31 DIAGNOSIS — E782 Mixed hyperlipidemia: Secondary | ICD-10-CM

## 2021-12-31 DIAGNOSIS — F17200 Nicotine dependence, unspecified, uncomplicated: Secondary | ICD-10-CM

## 2021-12-31 MED ORDER — ATORVASTATIN CALCIUM 20 MG PO TABS: 20.0000 mg | ORAL_TABLET | Freq: Every day | ORAL | 3 refills | Status: AC

## 2022-01-01 NOTE — Unmapped (Signed)
[facility] Refill Coordination Note    Specialty Medication(s) to be Shipped:   Inflammatory Disorders: Humira    Other medication(s) to be shipped: No additional medications requested for fill at this time     Krista Williamson, DOB: 11/28/1970  Phone: [phone] (home)       All above HIPAA information was verified with patient.     Was a Nurse, learning disability used for this call? No    Completed refill call assessment today to schedule patient's medication shipment from the [facility] [phone]).  All relevant notes have been reviewed.     Specialty medication(s) and dose(s) confirmed: Regimen is correct and unchanged.   Changes to medications: Krista Williamson reports no changes at this time.  Changes to insurance: No  New side effects reported not previously addressed with a pharmacist or physician: None reported  Questions for the pharmacist: No    Confirmed patient received a Conservation officer, historic buildings and a Surveyor, mining with first shipment. The patient will receive a drug information handout for each medication shipped and additional FDA Medication Guides as required.       DISEASE/MEDICATION-SPECIFIC INFORMATION        For patients on injectable medications: Patient currently has 1 doses left.  Next injection is scheduled for 01/06/2022.    SPECIALTY MEDICATION ADHERENCE     Medication Adherence    Patient reported X missed doses in the last month: 0  Specialty Medication: Humira  Patient is on additional specialty medications: No  Any gaps in refill history greater than 2 weeks in the last 3 months: no  Demonstrates understanding of importance of adherence: yes  Informant: patient  Reliability of informant: reliable  Confirmed plan for next specialty medication refill: delivery by pharmacy  Refills needed for supportive medications: not needed       Were doses missed due to medication being on hold? No    REFERRAL TO PHARMACIST     Referral to the pharmacist: Not needed      [facility]     Shipping address confirmed in Epic.     Delivery Scheduled: Yes, Expected medication delivery date: 01/06/2022.     Medication will be delivered via UPS to the prescription address in Epic WAM.    Krista Williamson D [name]   [facility] Specialty Technician

## 2022-01-05 MED FILL — HUMIRA(CF) PEN 80 MG/0.8 ML SUBCUTANEOUS KIT: 28 days supply | Qty: 4 | Fill #2

## 2022-01-08 ENCOUNTER — Ambulatory Visit: Payer: Medicare Other | Admitting: Cardiology

## 2022-01-26 NOTE — Unmapped (Signed)
[facility] Refill Coordination Note    Specialty Medication(s) to be Shipped:   Inflammatory Disorders: Humira    Other medication(s) to be shipped: No additional medications requested for fill at this time     Krista Williamson, DOB: [birth year]  Phone: [phone] (home)       All above HIPAA information was verified with patient.     Was a Nurse, learning disability used for this call? No    Completed refill call assessment today to schedule patient's medication shipment from the [facility] [phone]).  All relevant notes have been reviewed.     Specialty medication(s) and dose(s) confirmed: Regimen is correct and unchanged.   Changes to medications: Krista Williamson reports no changes at this time.  Changes to insurance: No  New side effects reported not previously addressed with a pharmacist or physician: None reported  Questions for the pharmacist: No    Confirmed patient received a Conservation officer, historic buildings and a Surveyor, mining with first shipment. The patient will receive a drug information handout for each medication shipped and additional FDA Medication Guides as required.       DISEASE/MEDICATION-SPECIFIC INFORMATION        For patients on injectable medications: Patient currently has 2 doses left.  Next injection is scheduled for 01/27/2022.    SPECIALTY MEDICATION ADHERENCE     Medication Adherence    Patient reported X missed doses in the last month: 0  Specialty Medication: Humira  Patient is on additional specialty medications: No  Any gaps in refill history greater than 2 weeks in the last 3 months: no  Demonstrates understanding of importance of adherence: yes  Informant: patient  Reliability of informant: reliable  Confirmed plan for next specialty medication refill: delivery by pharmacy  Refills needed for supportive medications: not needed       Were doses missed due to medication being on hold? No    REFERRAL TO PHARMACIST     Referral to the pharmacist: Not needed      [facility]     Shipping address confirmed in Epic.     Delivery Scheduled: Yes, Expected medication delivery date: 01/29/2022.     Medication will be delivered via UPS to the prescription address in Epic WAM.    Krista Williamson   [facility] Specialty Technician

## 2022-01-28 DIAGNOSIS — Z79899 Other long term (current) drug therapy: Principal | ICD-10-CM

## 2022-01-28 DIAGNOSIS — L732 Hidradenitis suppurativa: Principal | ICD-10-CM

## 2022-01-28 MED ORDER — HUMIRA(CF) PEN 80 MG/0.8 ML SUBCUTANEOUS KIT
3 refills | 0 days
Start: 2022-01-28 — End: ?

## 2022-01-28 MED FILL — HUMIRA(CF) PEN 80 MG/0.8 ML SUBCUTANEOUS KIT: 28 days supply | Qty: 4 | Fill #3

## 2022-01-30 MED ORDER — HUMIRA(CF) PEN 80 MG/0.8 ML SUBCUTANEOUS KIT
SUBCUTANEOUS | 3 refills | 28 days | Status: CP
Start: 2022-01-30 — End: ?
  Filled 2022-03-02: qty 4, 28d supply, fill #0

## 2022-02-26 NOTE — Unmapped (Signed)
[facility] Refill Coordination Note    Specialty Medication(s) to be Shipped:   Inflammatory Disorders: Humira    Other medication(s) to be shipped: No additional medications requested for fill at this time     Krista Williamson, DOB: 08/03/1971  Phone: [phone] (home)       All above HIPAA information was verified with patient.     Was a Nurse, learning disability used for this call? No    Completed refill call assessment today to schedule patient's medication shipment from the [facility] [phone]).  All relevant notes have been reviewed.     Specialty medication(s) and dose(s) confirmed: Regimen is correct and unchanged.   Changes to medications: Krista Williamson reports no changes at this time.  Changes to insurance: No  New side effects reported not previously addressed with a pharmacist or physician: None reported  Questions for the pharmacist: No    Confirmed patient received a Conservation officer, historic buildings and a Surveyor, mining with first shipment. The patient will receive a drug information handout for each medication shipped and additional FDA Medication Guides as required.       DISEASE/MEDICATION-SPECIFIC INFORMATION        For patients on injectable medications: Patient currently has 1? doses left.  Next injection is scheduled for 02/27/22.    SPECIALTY MEDICATION ADHERENCE     Medication Adherence    Patient reported X missed doses in the last month: 0  Specialty Medication: Humira  Patient is on additional specialty medications: No  Any gaps in refill history greater than 2 weeks in the last 3 months: no  Demonstrates understanding of importance of adherence: yes  Informant: patient  Reliability of informant: reliable  Provider-estimated medication adherence level: good  Reasons for non-adherence: no problems identified  Confirmed plan for next specialty medication refill: delivery by pharmacy  Refills needed for supportive medications: not needed              Were doses missed due to medication being on hold? No    HUMIRA(CF) PEN 80 mg/0.8 mL Pnkt (adalimumab)  : 7 days of medicine on hand       REFERRAL TO PHARMACIST     Referral to the pharmacist: Not needed      [facility]     Shipping address confirmed in Epic.     Delivery Scheduled: Yes, Expected medication delivery date: 03/03/22.     Medication will be delivered via UPS to the prescription address in Epic WAM.    Krista Williamson' Krista Williamson [facility] Specialty Technician

## 2022-03-31 NOTE — Unmapped (Signed)
The [facility] has made a second and final attempt to reach this patient to refill the following medication:Humira.      We have left voicemails on the following phone numbers: [phone], have sent a MyChart message and have sent a text message to the following phone numbers: [phone].    Dates contacted: 8/24 and 8/29  Last scheduled delivery: 8/1    The patient may be at risk of non-compliance with this medication. The patient should call the [facility] at [phone]  Option 4, then Option 2 (all other specialty patients) to refill medication.    Valere Dross   [facility] Specialty Technician

## 2022-05-18 ENCOUNTER — Ambulatory Visit
Admission: EM | Admit: 2022-05-18 | Discharge: 2022-05-18 | Disposition: A | Discharge status: Home / Self Care | Payer: Medicare Other

## 2022-05-18 ENCOUNTER — Encounter: Payer: Self-pay | Admitting: Emergency Medicine

## 2022-05-18 DIAGNOSIS — L989 Disorder of the skin and subcutaneous tissue, unspecified: Secondary | ICD-10-CM

## 2022-05-18 NOTE — ED Triage Notes (Signed)
Pt is present today with a knot on th back of her left lower leg. Pt states she noticed it one week ago

## 2022-05-18 NOTE — Discharge Instructions (Signed)
Follow-up with your primary care doctor for further evaluation and management.

## 2022-05-18 NOTE — ED Provider Notes (Signed)
[name]    CSN: [id number] Arrival date & time: 05/18/22  1429      History   Chief Complaint Chief Complaint  Patient presents with   Cyst    HPI Bailey Mcdaniel is a 51 y.o. female.   Patient presents with "knot" on posterior left lower leg that she noticed about a week ago.  Patient reports associated pain.  Denies any obvious injury to the area.  Denies any associated swelling or discoloration.  Denies any fever, body aches, chills, drainage from the area.  Patient reports that she does have history of abscesses in the groin but has never had one on her leg.  Denies any recent long distance travel.     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/21/2019   Hydradenitis 07/07/2016   Back pain 08/26/2011   Anxiety and depression 08/26/2011    Past Surgical History:  Procedure Laterality Date   ABDOMINAL HYSTERECTOMY     BACK SURGERY  03/28/2011   BACK SURGERY     BLADDER SURGERY     at time of hysterectomy   HAND SURGERY  2009   R hand, due to a Fx    HAND SURGERY     hsyterectomy and unilateral oophorectomy per patient     OPEN REDUCTION INTERNAL FIXATION (ORIF) PROXIMAL PHALANX Left 04/19/2020   Procedure: OPEN TREATMENT OF LEFT SMALL FINGER FRACTURE;  Surgeon: Milly Jakob, MD;  Location: [place];  Service: Orthopedics;  Laterality: Left;  LENGTH OF SURGERY: 75 MIN  PRE OP BLOCK   TYMPANOSTOMY  TUBE PLACEMENT Bilateral 06/20/2020    OB History     Gravida  3   Para  3   Term  0   Preterm  0   AB  0   Living  3      SAB  0   IAB  0   Ectopic  0   Multiple      Live Births               Home Medications    Prior to Admission medications   Medication Sig Start Date End Date Taking? Authorizing Provider  Adalimumab (HUMIRA PEN-CD/UC/HS STARTER) 80 MG/0.8ML PNKT Inject the contents of 1 pen (80mg ) under the skin weekly. 01/10/21   [provider]  albuterol (VENTOLIN HFA) 108 (90 Base) MCG/ACT inhaler Inhale 2 puffs into the lungs every 4 (four) hours as needed for wheezing or shortness of breath. 03/13/21   Dara Hoyer, FNP  atorvastatin (LIPITOR) 20 MG tablet Take 1 tablet (20 mg total) by mouth daily. 12/31/21   Cantwell, Celeste C, PA-C  Carbinoxamine Maleate 4 MG TABS Take 2 tablets (8 mg total) by mouth in the morning and at bedtime. 04/10/21   Ambs, Kathrine Cords, FNP  clonazePAM (KLONOPIN) 1 MG tablet Take 1 mg by mouth daily.  [provider]  fluticasone-salmeterol (ADVAIR HFA) 230-21 MCG/ACT inhaler Inhale 2 puffs into the lungs 2 (two) times daily. Patient taking differently: Inhale 2 puffs into the lungs as needed. 03/13/21   Hetty Blend, FNP  LINZESS 145 MCG CAPS capsule Take 145 mcg by mouth daily. 10/16/21   [provider]  Tiotropium Bromide Monohydrate (SPIRIVA RESPIMAT) 1.25 MCG/ACT AERS Inhale 2 puffs into the lungs daily. Patient taking differently: Inhale 2 puffs into the lungs as needed. 05/02/20   Ambs, Norvel Richards, FNP    Family History Family History  Problem Relation Age of Onset   Sudden death Father    Heart attack Father        F MI at age 51 and 44   Cancer Mother        type?   Hypertension Mother    Stroke Mother    Diabetes Mother    CAD Mother    Colon cancer Neg Hx    Breast cancer Neg Hx     Social History Social History   Tobacco Use   Smoking status: Every Day    Packs/day: 3.00    Years:  33.00    Total pack years: 99.00    Types: Cigarettes    Start date: 71   Smokeless tobacco: Never   Tobacco comments:    trying to quit, down to 3-5 cigs a day  Vaping Use   Vaping Use: Never used  Substance Use Topics   Alcohol use: Not Currently   Drug use: No     Allergies   Patient has no known allergies.   Review of Systems Review of Systems Per HPI  Physical Exam Triage Vital Signs ED Triage Vitals [05/18/22 1621]  Enc Vitals Group     BP 121/67     Pulse Rate 80     Resp 18     Temp 97.9 F (36.6 C)     Temp src      SpO2 96 %     Weight      Height      Head Circumference      Peak Flow      Pain Score 7     Pain Loc      Pain Edu?      Excl. in GC?    No data found.  Updated Vital Signs BP 121/67   Pulse 80   Temp 97.9 F (36.6 C)   Resp 18   SpO2 96%   Visual Acuity Right Eye Distance:   Left Eye Distance:   Bilateral Distance:    Right Eye Near:   Left Eye Near:    Bilateral Near:     Physical Exam Constitutional:      General: She is not in acute distress.    Appearance: Normal appearance. She is not toxic-appearing.  HENT:     Head: Normocephalic and atraumatic.  Eyes:     Extraocular Movements: Extraocular movements intact.     Conjunctiva/sclera: Conjunctivae normal.  Pulmonary:     Effort: Pulmonary effort is normal.  Skin:         Comments: Patient has approximately 0.5 cm area of induration that is flesh-colored with no associated swelling present to posterior left lower leg.  No drainage noted.  It is nonmobile.  Capillary refill and pulses intact.  Patient can bear weight.  Patient has full ROM of ankle, toes.  Neurological:     General: No focal deficit present.  Mental Status: She is alert and oriented to person, place, and time. Mental status is at baseline.  Psychiatric:        Mood and Affect: Mood normal.        Behavior: Behavior normal.        Thought Content: Thought content normal.        Judgment:  Judgment normal.      UC Treatments / Results  Labs (all labs ordered are listed, but only abnormal results are displayed) Labs Reviewed - No data to display  EKG   Radiology No results found.  Procedures Procedures (including critical care time)  Medications Ordered in UC Medications - No data to display  Initial Impression / Assessment and Plan / UC Course  I have reviewed the triage vital signs and the nursing notes.  Pertinent labs & imaging results that were available during my care of the patient were reviewed by me and considered in my medical decision making (see chart for details).     Differential diagnoses include cyst versus abscess versus insect bite.  It does not appear to be abscess or any infection so do not think antibiotic therapy is necessary.  Most suspicious of a cyst.  There is no suspicion for DVT.  Advised patient that ultrasound will be beneficial to determine exact etiology of lesion which cannot be provided here in urgent care.  Patient will need to follow-up with PCP for further evaluation and management.  Offered for nursing staff to make PCP appointment as she does not currently have a PCP but she declined stating that she would make her own appointment.  Patient was given strict return precautions.  Patient verbalized understanding and was agreeable with plan. Final Clinical Impressions(s) / UC Diagnoses   Final diagnoses:  Leg skin lesion, left     Discharge Instructions      Follow-up with your primary care doctor for further evaluation and management.    ED Prescriptions   None    PDMP not reviewed this encounter.   Gustavus Bryant, [place] 05/18/22 [phone]

## 2022-05-19 ENCOUNTER — Other Ambulatory Visit: Payer: Self-pay | Admitting: Cardiology

## 2022-07-03 NOTE — Unmapped (Signed)
[facility] Clinical Assessment & Refill Coordination Note    Krista Williamson, DOB: 05-Mar-1971  Phone: [phone] (home)     All above HIPAA information was verified with patient.     Was a Nurse, learning disability used for this call? No    Specialty Medication(s):   Inflammatory Disorders: Humira     Current Outpatient Medications   Medication Sig Dispense Refill    acetaminophen (TYLENOL) 325 MG tablet Frequency:   Dosage:0.0     Instructions:  Note:      adalimumab (HUMIRA,CF, PEN) 80 mg/0.8 mL PnKt Inject the contents of 1 pen (80 mg total) under the skin every seven (7) days. 4 each 3    atorvastatin (LIPITOR) 10 MG tablet Take 10 mg by mouth.      buPROPion (WELLBUTRIN SR) 150 MG 12 hr tablet Take 150 mg by mouth.      cephalexin (KEFLEX) 500 MG capsule Take 500 mg by mouth Two (2) times a day.      clindamycin (CLEOCIN) 300 MG capsule Take 300 MG BID with food for flares. 60 capsule 2    clonazePAM (KLONOPIN) 1 MG tablet Take 1 mg by mouth daily as needed for anxiety.      HYDROcodone-acetaminophen (NORCO) 5-325 mg per tablet Take 1 tablet by mouth.      ibuprofen (ADVIL,MOTRIN) 200 MG tablet Take 400 mg by mouth.      lubiprostone (AMITIZA) 8 MCG capsule Take 8 mcg by mouth 2 (two) times a day with meals.      naproxen sodium (ALEVE) 220 MG tablet Take 440-660 mg by mouth.      ofloxacin (OCUFLOX) 0.3 % ophthalmic solution Apply 5 drops in left ear twice daily for 7 days      rifAMPin (RIFADIN) 300 MG capsule Take 1 capsule (300 mg total) by mouth Two (2) times a day. Take for flares. 60 capsule 2    traMADol (ULTRAM) 50 mg tablet Take 1 tablet (50 mg total) by mouth every six (6) hours as needed for pain. 10 tablet 0     No current facility-administered medications for this visit.        Changes to medications: Shantera reports no changes at this time.    No Known Allergies    Changes to allergies: No    SPECIALTY MEDICATION ADHERENCE       Medication Adherence    Patient reported X missed doses in the last month: all  Specialty Medication: Humira  Patient is on additional specialty medications: No  Informant: patient                            Specialty medication(s) dose(s) confirmed: Regimen is correct and unchanged.     Are there any concerns with adherence? Yes: patient has not been taking care of herself as much    Adherence counseling provided? Yes: patient will stay on the medication now.  She had a lot of stressors at home and health concerns for family members but things are better now    CLINICAL MANAGEMENT AND INTERVENTION      Clinical Benefit Assessment:    Do you feel the medicine is effective or helping your condition? Yes    Clinical Benefit counseling provided?  Last appt 09/01/21 and scheduled a follow-up for 12/2022, no flares    Adverse Effects Assessment:    Are you experiencing any side effects? No  Are you experiencing difficulty administering your medicine? No    Quality of Life Assessment:    Quality of Life    Rheumatology  1. What impact has your specialty medication had on the reduction of your daily pain level?: Some  2. What impact has your specialty medication had on your ability to complete daily tasks (prepare meals, get dressed, etc...)?: Some  Oncology  Dermatology  Cystic Fibrosis          How many days over the past month did your HS  keep you from your normal activities? For example, brushing your teeth or getting up in the morning. 0    Have you discussed this with your provider? Not needed    Acute Infection Status:    Acute infections noted within Epic:  No active infections  Patient reported infection: None    Therapy Appropriateness:    Is therapy appropriate and patient progressing towards therapeutic goals? Yes, therapy is appropriate and should be continued    DISEASE/MEDICATION-SPECIFIC INFORMATION      For patients on injectable medications: Patient currently has 0 doses left.  Next injection is scheduled for ASAP, 12/6.    Chronic Inflammatory Diseases: Have you experienced any flares in the last month? No    PATIENT SPECIFIC NEEDS     Does the patient have any physical, cognitive, or cultural barriers? No    Is the patient high risk? No    Did the patient require a clinical intervention? No    Does the patient require physician intervention or other additional services (i.e., nutrition, smoking cessation, social work)? No    SOCIAL DETERMINANTS OF HEALTH     At the [facility], we have learned that life circumstances - like trouble affording food, housing, utilities, or transportation can affect the health of many of our patients.   That is why we wanted to ask: are you currently experiencing any life circumstances that are negatively impacting your health and/or quality of life? Patient declined to answer    Social Determinants of Health     Financial Resource Strain: Not on file   Internet Connectivity: Not on file   Food Insecurity: Not on file   Tobacco Use: High Risk (09/01/2021)    Patient History     Smoking Tobacco Use: Every Day     Smokeless Tobacco Use: Never     Passive Exposure: Not on file   Housing/Utilities: Unknown (04/11/2021)    Housing/Utilities     Within the past 12 months, have you ever stayed: outside, in a car, in a tent, in an overnight shelter, or temporarily in someone else's home (i.e. couch-surfing)?: No     Are you worried about losing your housing?: Not on file     Within the past 12 months, have you been unable to get utilities (heat, electricity) when it was really needed?: Not on file   Alcohol Use: Not on file   Transportation Needs: Not on file   Substance Use: Not on file   Health Literacy: Low Risk  (04/11/2021)    Health Literacy     : Never   Physical Activity: Not on file   Interpersonal Safety: Not on file   Stress: Not on file   Intimate Partner Violence: Not on file   Depression: Not on file   Social Connections: Not on file       Would you be willing to receive help with any of the needs that you have identified  today? Not applicable       SHIPPING     Specialty Medication(s) to be Shipped:   Inflammatory Disorders: Humira    Other medication(s) to be shipped: No additional medications requested for fill at this time     Changes to insurance: No    Delivery Scheduled: Yes, Expected medication delivery date: 12/5.     Medication will be delivered via UPS to the confirmed prescription address in [place].    The patient will receive a drug information handout for each medication shipped and additional FDA Medication Guides as required.  Verified that patient has previously received a Conservation officer, historic buildings and a Surveyor, mining.    The patient or caregiver noted above participated in the development of this care plan and knows that they can request review of or adjustments to the care plan at any time.      All of the patient's questions and concerns have been addressed.    Julianne Rice, PharmD   [facility] Specialty Pharmacist

## 2022-07-07 MED FILL — HUMIRA(CF) PEN 80 MG/0.8 ML SUBCUTANEOUS KIT: SUBCUTANEOUS | 28 days supply | Qty: 4 | Fill #1

## 2022-07-13 NOTE — Progress Notes (Unsigned)
   [address] [place] [address] Dept: [phone]  FOLLOW UP NOTE  Patient ID: Bailey Mcdaniel, female    DOB: 11/07/1970  Age: 51 y.o. MRN: [id number] Date of Office Visit: 07/14/2022  Assessment  Chief Complaint: No chief complaint on file.  HPI Bailey Mcdaniel is a 51 year old female who presents to the clinic for a follow up visit. She was last seen in this clinic on 04/10/2021 by Thermon Leyland, FNP, for evaluation of asthma/COPD, allergic rhinitis, allergic conjunctiivits, eustachian tube dysfunction, and tobacco use.    Drug Allergies:  No Known Allergies  Physical Exam: There were no vitals taken for this visit.   Physical Exam  Diagnostics:    Assessment and Plan: No diagnosis found.  No orders of the defined types were placed in this encounter.   There are no Patient Instructions on file for this visit.  No follow-ups on file.    Thank you for the opportunity to care for this patient.  Please do not hesitate to contact me with questions.  Thermon Leyland, FNP [facility]

## 2022-07-13 NOTE — Patient Instructions (Incomplete)
Asthma COPD overlap Continue albuterol 2 puffs once every 4 hours as needed for cough or wheeze You may use albuterol 2 puffs 5-15 minutes before activity to decrease cough or wheeze For asthma flare, begin Advair 230-2 puffs twice a day with a spacer for 2 weeks or until cough and wheeze free. Call the clinic if you need to start this  Allergic rhinitis Continue carbinoxamine 8 mg twice a day for nasal symptoms Continue Flonase 2 sprays in each nostril once a day for stuffy nose.  In the right nostril, point the applicator out toward the right ear. In the left nostril, point the applicator out toward the left ear Begin saline nasal rinses as needed for nasal symptoms. Use this before any medicated nasal sprays for best result Continue azelastine 2 sprays in each nostril twice a day as needed for a runny nose or sinus headache Continue allergen avoidance measures directed toward pollen, pets, dust mites, and cockroach as listed below For thick post nasal drainage, begin Mucinex (guaifenesin) 1200 mg twice a day and increase hydration in order to this mucus   Allergic conjunctivitis Some over the counter eye drops include Pataday one drop in each eye once a day as needed for red, itchy eyes OR Zaditor one drop in each eye twice a day as needed for red itchy eyes.  Tobacco use Stop smoking or try to cut down on smoking.  Written information provided from [employer] at her last visit to this clinic  Eustachian tube dysfunction Tympanostomy tubes remain in place Continue to follow up with your ENT specialist, Dr. Jenne Pane as prescribed  Call the clinic if this treatment plan is not working well for you  Follow up in 6 months or sooner if needed.  Reducing Pollen Exposure The American Academy of Allergy, Asthma and Immunology suggests the following steps to reduce your exposure to pollen during allergy seasons. Do not hang sheets or clothing out to dry; pollen may collect on these items. Do not  mow lawns or spend time around freshly cut grass; mowing stirs up pollen. Keep windows closed at night.  Keep car windows closed while driving. Minimize morning activities outdoors, a time when pollen counts are usually at their highest. Stay indoors as much as possible when pollen counts or humidity is high and on windy days when pollen tends to remain in the air longer. Use air conditioning when possible.  Many air conditioners have filters that trap the pollen spores. Use a HEPA room air filter to remove pollen form the indoor air you breathe.  Control of Dog or Cat Allergen Avoidance is the best way to manage a dog or cat allergy. If you have a dog or cat and are allergic to dog or cats, consider removing the dog or cat from the home. If you have a dog or cat but don't want to find it a new home, or if your family wants a pet even though someone in the household is allergic, here are some strategies that may help keep symptoms at bay:  Keep the pet out of your bedroom and restrict it to only a few rooms. Be advised that keeping the dog or cat in only one room will not limit the allergens to that room. Don't pet, hug or kiss the dog or cat; if you do, wash your hands with soap and water. High-efficiency particulate air (HEPA) cleaners run continuously in a bedroom or living room can reduce allergen levels over time. Regular use of  a high-efficiency vacuum cleaner or a central vacuum can reduce allergen levels. Giving your dog or cat a bath at least once a week can reduce airborne allergen.   Control of Dust Mite Allergen Dust mites play a major role in allergic asthma and rhinitis. They occur in environments with high humidity wherever human skin is found. Dust mites absorb humidity from the atmosphere (ie, they do not drink) and feed on organic matter (including shed human and animal skin). Dust mites are a microscopic type of insect that you cannot see with the naked eye. High levels of dust  mites have been detected from mattresses, pillows, carpets, upholstered furniture, bed covers, clothes, soft toys and any woven material. The principal allergen of the dust mite is found in its feces. A gram of dust may contain 1,000 mites and 250,000 fecal particles. Mite antigen is easily measured in the air during house cleaning activities. Dust mites do not bite and do not cause harm to humans, other than by triggering allergies/asthma.  Ways to decrease your exposure to dust mites in your home:  1. Encase mattresses, box springs and pillows with a mite-impermeable barrier or cover  2. Wash sheets, blankets and drapes weekly in hot water (130 F) with detergent and dry them in a dryer on the hot setting.  3. Have the room cleaned frequently with a vacuum cleaner and a damp dust-mop. For carpeting or rugs, vacuuming with a vacuum cleaner equipped with a high-efficiency particulate air (HEPA) filter. The dust mite allergic individual should not be in a room which is being cleaned and should wait 1 hour after cleaning before going into the room.  4. Do not sleep on upholstered furniture (eg, couches).  5. If possible removing carpeting, upholstered furniture and drapery from the home is ideal. Horizontal blinds should be eliminated in the rooms where the person spends the most time (bedroom, study, television room). Washable vinyl, roller-type shades are optimal.  6. Remove all non-washable stuffed toys from the bedroom. Wash stuffed toys weekly like sheets and blankets above.  7. Reduce indoor humidity to less than 50%. Inexpensive humidity monitors can be purchased at most hardware stores. Do not use a humidifier as can make the problem worse and are not recommended.  Control of Cockroach Allergen  Cockroach allergen has been identified as an important cause of acute attacks of asthma, especially in urban settings.  There are fifty-five species of cockroach that exist in the [place],  however only three, the American, German and Oriental species produce allergen that can affect patients with Asthma.  Allergens can be obtained from fecal particles, egg casings and secretions from cockroaches.    Remove food sources. Reduce access to water. Seal access and entry points. Spray runways with 0.5-1% Diazinon or Chlorpyrifos Blow boric acid power under stoves and refrigerator. Place bait stations (hydramethylnon) at feeding sites.    

## 2022-07-14 ENCOUNTER — Other Ambulatory Visit: Payer: Self-pay

## 2022-07-14 ENCOUNTER — Encounter: Payer: Self-pay | Admitting: Family Medicine

## 2022-07-14 ENCOUNTER — Ambulatory Visit (INDEPENDENT_AMBULATORY_CARE_PROVIDER_SITE_OTHER): Payer: Medicare Other | Admitting: Family Medicine

## 2022-07-14 VITALS — BP 122/70 | HR 89 | Temp 98.3°F | Resp 16 | Ht 63.0 in | Wt 139.6 lb

## 2022-07-14 DIAGNOSIS — J3089 Other allergic rhinitis: Secondary | ICD-10-CM | POA: Diagnosis not present

## 2022-07-14 DIAGNOSIS — H6993 Unspecified Eustachian tube disorder, bilateral: Secondary | ICD-10-CM

## 2022-07-14 DIAGNOSIS — J4489 Other specified chronic obstructive pulmonary disease: Secondary | ICD-10-CM

## 2022-07-14 DIAGNOSIS — H1013 Acute atopic conjunctivitis, bilateral: Secondary | ICD-10-CM

## 2022-07-14 DIAGNOSIS — J302 Other seasonal allergic rhinitis: Secondary | ICD-10-CM

## 2022-07-14 DIAGNOSIS — H1010 Acute atopic conjunctivitis, unspecified eye: Secondary | ICD-10-CM

## 2022-07-14 DIAGNOSIS — Z720 Tobacco use: Secondary | ICD-10-CM

## 2022-07-14 MED ORDER — SPIRIVA RESPIMAT 1.25 MCG/ACT IN AERS: 2.0000 | INHALATION_SPRAY | Freq: Every day | RESPIRATORY_TRACT | 2 refills | Status: AC

## 2022-07-14 MED ORDER — FLUTICASONE-SALMETEROL 230-21 MCG/ACT IN AERO: 2.0000 | INHALATION_SPRAY | Freq: Two times a day (BID) | RESPIRATORY_TRACT | 0 refills | Status: AC

## 2022-07-14 MED ORDER — ALBUTEROL SULFATE HFA 108 (90 BASE) MCG/ACT IN AERS: 2.0000 | INHALATION_SPRAY | RESPIRATORY_TRACT | 0 refills | Status: DC | PRN

## 2022-07-30 DIAGNOSIS — Z79899 Other long term (current) drug therapy: Principal | ICD-10-CM

## 2022-07-30 DIAGNOSIS — L732 Hidradenitis suppurativa: Principal | ICD-10-CM

## 2022-07-30 NOTE — Unmapped (Signed)
[facility] Refill Coordination Note    Specialty Medication(s) to be Shipped:   Inflammatory Disorders: Humira    Other medication(s) to be shipped: No additional medications requested for fill at this time     Krista Williamson, DOB: November 09, 1970  Phone: [phone] (home)       All above HIPAA information was verified with patient.     Was a Nurse, learning disability used for this call? No    Completed refill call assessment today to schedule patient's medication shipment from the [facility] [phone]).  All relevant notes have been reviewed.     Specialty medication(s) and dose(s) confirmed: Regimen is correct and unchanged.   Changes to medications: Krista Williamson reports no changes at this time.  Changes to insurance: No  New side effects reported not previously addressed with a pharmacist or physician: None reported  Questions for the pharmacist: No    Confirmed patient received a Conservation officer, historic buildings and a Surveyor, mining with first shipment. The patient will receive a drug information handout for each medication shipped and additional FDA Medication Guides as required.       DISEASE/MEDICATION-SPECIFIC INFORMATION        For patients on injectable medications: Patient currently has 0 doses left.  Next injection is scheduled for 1/3.    SPECIALTY MEDICATION ADHERENCE     Medication Adherence    Patient reported X missed doses in the last month: 0  Specialty Medication: humira  Patient is on additional specialty medications: No  Any gaps in refill history greater than 2 weeks in the last 3 months: no  Demonstrates understanding of importance of adherence: yes  Informant: patient  Reliability of informant: reliable              Confirmed plan for next specialty medication refill: delivery by pharmacy  Refills needed for supportive medications: not needed       Were doses missed due to medication being on hold? No     Humira CF 80 mg/0.8 ml: 0 on hand    REFERRAL TO PHARMACIST Referral to the pharmacist: Not needed      [facility]     Shipping address confirmed in Epic.     Delivery Scheduled: Yes, Expected medication delivery date: 1/3.     Medication will be delivered via UPS to the prescription address in Epic WAM.    Valere Dross   [facility] Specialty Technician

## 2022-08-04 MED FILL — HUMIRA(CF) PEN 80 MG/0.8 ML SUBCUTANEOUS KIT: SUBCUTANEOUS | 28 days supply | Qty: 4 | Fill #2

## 2022-08-19 NOTE — Unmapped (Signed)
[facility] Refill Coordination Note    Krista Williamson, DOB: 12-05-1970  Phone: [phone] (home)       All above HIPAA information was verified with patient.         08/18/2022    11:15 AM   Specialty Rx Medication Refill Questionnaire   Which Medications would you like refilled and shipped? Humira   Please list all current allergies: None   Have you missed any doses in the last 30 days? No   Have you had any changes to your medication(s) since your last refill? No   How many days remaining of each medication do you have at home? 1   Have you experienced any side effects in the last 30 days? No   Please enter the full address (street address, city, state, zip code) where you would like your medication(s) to be delivered to. [address]   Please specify on which day you would like your medication(s) to arrive. Note: if you need your medication(s) within 3 days, please call the pharmacy to schedule your order at [phone]  08/25/2022   Has your insurance changed since your last refill? No   Would you like a pharmacist to call you to discuss your medication(s)? No   Do you require a signature for your package? (Note: if we are billing Medicare Part B or your order contains a controlled substance, we will require a signature) No         Completed refill call assessment today to schedule patient's medication shipment from the [facility] [phone]).  All relevant notes have been reviewed.       Confirmed patient received a Conservation officer, historic buildings and a Surveyor, mining with first shipment. The patient will receive a drug information handout for each medication shipped and additional FDA Medication Guides as required.         REFERRAL TO PHARMACIST     Referral to the pharmacist: Not needed      [facility]     Shipping address confirmed in Epic.     Delivery Scheduled: Yes, Expected medication delivery date: 08/25/22.     Medication will be delivered via UPS to the prescription address in Epic WAM.    Arnold Long, PharmD   [facility] Specialty Pharmacist

## 2022-08-24 MED FILL — HUMIRA(CF) PEN 80 MG/0.8 ML SUBCUTANEOUS KIT: SUBCUTANEOUS | 28 days supply | Qty: 4 | Fill #3

## 2022-09-29 DIAGNOSIS — L732 Hidradenitis suppurativa: Principal | ICD-10-CM

## 2022-09-29 DIAGNOSIS — Z79899 Other long term (current) drug therapy: Principal | ICD-10-CM

## 2022-09-29 MED ORDER — HUMIRA(CF) PEN 80 MG/0.8 ML SUBCUTANEOUS KIT
SUBCUTANEOUS | 3 refills | 28 days
Start: 2022-09-29 — End: ?

## 2022-09-29 NOTE — Unmapped (Signed)
[facility] Refill Coordination Note    Specialty Medication(s) to be Shipped:   Inflammatory Disorders: Humira    Other medication(s) to be shipped: No additional medications requested for fill at this time     Krista Williamson, DOB: [birth year]  Phone: [phone] (home)       All above HIPAA information was verified with patient.     Was a Nurse, learning disability used for this call? No    Completed refill call assessment today to schedule patient's medication shipment from the [facility] [phone]).  All relevant notes have been reviewed.     Specialty medication(s) and dose(s) confirmed: Regimen is correct and unchanged.   Changes to medications: [name] reports no changes at this time.  Changes to insurance: No  New side effects reported not previously addressed with a pharmacist or physician: None reported  Questions for the pharmacist: No    Confirmed patient received a Conservation officer, historic buildings and a Surveyor, mining with first shipment. The patient will receive a drug information handout for each medication shipped and additional FDA Medication Guides as required.       DISEASE/MEDICATION-SPECIFIC INFORMATION        For patients on injectable medications: Patient currently has 1 doses left.  Next injection is scheduled for 2/29.    SPECIALTY MEDICATION ADHERENCE     Medication Adherence    Patient reported X missed doses in the last month: 0  Specialty Medication: Humira 80/mg/0.8 mL  Patient is on additional specialty medications: No  Informant: patient              Were doses missed due to medication being on hold? No    Humira 80/0.8 mg/ml: 2 days of medicine on hand       REFERRAL TO PHARMACIST     Referral to the pharmacist: Not needed      [facility]     Shipping address confirmed in Epic.     Patient was notified of new phone menu : Yes    Delivery Scheduled: Yes, Expected medication delivery date: 3/5.  However, Rx request for refills was sent to the provider as there are none remaining.     Medication will be delivered via UPS to the prescription address in Epic WAM.    [name]   [facility] Specialty Technician

## 2022-09-30 NOTE — Unmapped (Signed)
Pt needs an appt last seen 08/2021

## 2022-09-30 NOTE — Unmapped (Signed)
Appointment on 05/13 with Dr Chrissie Noa,  Toni Amend

## 2022-10-07 ENCOUNTER — Other Ambulatory Visit: Payer: Self-pay | Admitting: Surgery

## 2022-10-07 DIAGNOSIS — N6452 Nipple discharge: Secondary | ICD-10-CM

## 2022-10-25 ENCOUNTER — Inpatient Hospital Stay: Admission: RE | Admit: 2022-10-25 | Payer: Medicare Other | Source: Ambulatory Visit

## 2022-10-28 NOTE — Unmapped (Signed)
Krista Williamson 's Humira refill will be delayed as a result of no refills remain on the prescription.  Refill request sent to provider denied until patient has appointment    I have reached out to the patient  at [phone] and communicated the delay. Patient stated she has an appointment with Provider scheduled for next month, and will reach out to them for a new prescription.      We will call the patient back to reschedule the delivery upon resolution. Refill coordination date has been updated.

## 2022-11-11 MED ORDER — ADALIMUMAB 80 MG/0.8 ML SUBCUTANEOUS PEN KIT
1 refills | 0.00000 days
Start: 2022-11-11 — End: ?

## 2022-11-11 NOTE — Unmapped (Signed)
Pt needs an appt, last seen jan 2023

## 2022-11-11 NOTE — Unmapped (Signed)
Appointment on 05/13 with Dr Sayed    Thanks,  Courtney

## 2022-11-26 ENCOUNTER — Ambulatory Visit
Admission: RE | Admit: 2022-11-26 | Discharge: 2022-11-26 | Disposition: A | Discharge status: Home / Self Care | Payer: 59 | Source: Ambulatory Visit | Attending: Surgery | Admitting: Surgery

## 2022-11-26 DIAGNOSIS — N6452 Nipple discharge: Secondary | ICD-10-CM

## 2022-11-26 MED ORDER — GADOPICLENOL 0.5 MMOL/ML IV SOLN
6.0000 mL | Freq: Once | INTRAVENOUS | Status: AC | PRN
  Administered 2022-11-26: 6 mL via INTRAVENOUS

## 2022-12-11 NOTE — Unmapped (Addendum)
Please have your labs done at [facility]    [address] [facility], [place], [place], [place] [address]      Restart humira injections every week.    Take 1 capsule of clindamycin twice a day when flaring.    Take 1 capsule of rifampin twice a day when flaring.       You are welcome to join the [facility] for HS of the [employer] group online at [email or web address] or in-person at our regular meetings.  You can textHS to [phone] for meeting reminders or join the group online for regular updates.  This can be a great opportunity to interact and learn from other patients and help work with the HS community.  We hope to see your there!    Hidradentis Suppurativa (pronounced ???high-drad-en-eye-tis/sup-your-uh-tee-vah???) is a chronic disease of hair follicles.  The lesions occur most commonly on areas of skin-to-skin contact: under the arms (axillary area), in the groin, around the buttocks, in the region around the anus and genitals, and on the skin between and under the breasts. In women, the underarms, groin, and breast areas are most commonly affected. Men most often have HS lesions on the buttocks and under the arms and may also have HS at the back of the neck and behind and around the ears.    What does HS look and feel like?   The first thing that someone with HS notices is a tender, raised, red bump that looks like an under-the-skin pimple or boil. Sometimes HS lesions have two or more ???heads.???  In mild disease only an occasional boil or abscess may occur, but in more active disease there can be many new lesions every month.  Some abscesses can become larger and may open and drain pus.  Bleeding and increased odor can also occur. In severe disease, deeper abscesses develop and may connect with each other under the skin to form tunnel-like tracts (sinuses, fistulas).  These may drain constantly, or may temporarily improve and then usually begin draining again over time.  In people who have had sinus tracts for some time, scars form that feel like ropes under the skin. In the very worst cases, networks of sinus tracts can form deeper in the body, including the muscle and other tissues. Many people with severe HS have scars that can limit their ability to freely move their arms or legs, though this is very unlikely for most patients.     Clinicians usually classify or ???grade??? HS using the [facility] staging system according to the severity of the disease for each body location:   [place] stage I: one or more abscesses are present, but no sinus tracts have formed and no scars have developed   Doreene Adas stage II: one or more abscesses are present that resolve and recur; on sinus tract can be present and scarring is seen   Doreene Adas stage III: many abscesses and more than one sinus tract is present with extensive scars.    What causes HS?  The cause of HS is not completely understood.  It seems to be a disorder of hair follicles and often many family members are affected so genetics probably play a strong role.  Bacteria are often present and may make the disease worse, but infection does not seem to be the main cause. Hormones are also likely play a role since the condition typically starts around puberty when hair follicles under the arms and in the groin  start to change.  It can sometimes flare with menstrual cycles in women as well.  In most cases it lasts for decades and starts to improve to some extent in the late 30s and 40s as long as many fistulas have not already formed.  Women are three times more likely than men to develop HS.    Other factors are known to contribute to HS flaring or becoming worse, though they are likely not the main causes. The factors most commonly associated with HS include:   Cigarette smoking - Stopping smoking will likely not cure the disease, but likely is helpful in reducing how much and how often it flares and may prevent it from getting as bad over time.   Higher weight - HS may occur even in people that are not overweight, but it is much more common in patients that are.  There is some evidence that losing weight and eating a diet low in sugars and fats may be helpful in improving hidradenitis, though this is not helpful for everyone.  Working with a nutritionist may be an important way to help with this and is something your physician can help coordinate    Hidradenitis is not contagious.  It is not caused by a problem with personal hygiene or any other activity or behavior of those with the disease.    How can your doctor help you treat your hidradenitis?  Clinicians use both medication and surgery to treat HS. The choice of treatment--or combination of treatments--is made according to an individual patient???s needs. Clinicians consider several factors in determining the most appropriate plan for therapy:   Severity of disease - medications and some laser treatments are usually able to control disease best when fistulas are not present.  Fistulas typically require surgery.   Extent and location of disease   Chronicity (how often the lesions recur)    A number of different surgical methods have been developed that are useful for certain patients under particular circumstances. These can be done with local numbing and healing at home for some areas when disease is not too extensive with relatively brief recovery times.  In more extensive disease there may be a need for larger excisions under general anesthesia with healing time in the hospital and prolonged recovery periods for better disease control.      In addition, many medical treatments have been tried--some with more success than others. No medication is effective for all patients, and you and your doctor may have to try several different treatments or combinations of treatments before you find the treatment plan that works best for you.  The goals of therapy with medications that are either topical (used on the skin) or systemic (taken by mouth) are:  1. to clear the lesions or at least reduce their number and extent, and  2. to prevent new lesions from forming.  3. To reduce pain, drainage, and odor  Some of the types of medications commonly used are antibacterial skin washes and the topical antibiotics to prevent secondary infections and corticosteroid injections into the lesions to reduce inflammation.     Other medications that may be used include retinoids (similar to Accutane), drugs that effect how hormones and hair follicles interact, drugs that affect your immune system (such as methotrexate, adalimumab/Humira, and Remicaid/infliximab), steroids, and oral antibiotics.    Lasers that destroy hair follicles can also be helpful since they reduce the hair follicles that cause the problems.  Multiple treatments are typically required over  time and there is some discomfort associated with treatment, but it is typically very fast and well-tolerated.    It is very important to realize that hidradenitis cannot usually be completely cured with any single medication or surgical procedure.  It is a disease that can be very stubborn and difficult to control, but with good treatment a lot of improvement and sometimes temporary remissions can be obtained. Poorly controlled disease can cause more fistulas to form and make managing the disease much more difficult over time so it is important to seek care to reduce major flares.  Surgery can provide a long term cure in some areas, though the disease can start again or continue in nearby areas.  A dermatologist is often the best person to help coordinate disease treatment, and sometimes other surgeons, pain specialists, other specialists, and nutritionists may be part of the treatment team.    For severe disease, the first goal is often to reduce pain and symptoms with medicines so that the disease feels more stable. Once it's stable, we often start thinking about how to address areas that have completely gotten better with surgery if they are still causing problems.    What can you do to help your HS?  1. Stopping smoking is hard and may not fix everything, but it may be a step in the right direction.  We or your primary care physician can provide resources to help stop if you are interested.  2. Follow a healthy diet and try to achieve a healthy weight.  Some other self-help measures are:   Keep your skin cool and dry (becoming overheated and sweating can contribute to an HS flare)   To reduce the pain of cysts or nodules or to help them to drain, apply hot compresses or soak in hot water for 10 minutes at a time (use a clean washcloth or a teabag soaked in hot water)   For female patients, cotton underwear that does not have tight elastic in the groin can be helpful.  Boyshort, brief, or boxer style underwear may be a better option as friction on hair follicles in affected areas can be a major trigger in some patients.  These can be easily found on [place] or with some retailers.  Fruit of the Loom and Underworks are two brands that are sometimes recommended.    Finally, know that you are not alone. Coping with the pain and other symptoms of HS can be very difficult, so it may be helpful to connect with others who live with HS. Patient groups and networks can be sources of important information and support. Some internet resources for information and connections are provided below.    Psychologytoday.com is a resource to find psychologists and therapists that can help support you in your are     [email or web address] can help connect with sexual health resources and counselors    Resources for Information    The Hidradenitis Suppurativa Foundation: A nonprofit organized by a group of physicians interested in treating and advancing research in hidradenitis suppurativa.  This group advocates for better care and research for hidradenitis and has educational materials put together specifically for patients that have been reviewed and produced by doctors and people with hidradenitis.    American Academy of Dermatology  ARanked.fi    [employer]  ElevatorPitchers.de.html  NORD: [employer] for [employer]  https://www.rarediseases.org/rare-disease-information/rare-diseases/byID/358/viewAbstract  Trials of new medications for HS  Https://www.clinicaltrials.gov

## 2022-12-11 NOTE — Unmapped (Signed)
Dermatology Note    Assessment and Plan:      Hidradenitis suppurativa, Hurley Stage II, flaring today off Humira  - We discussed the typical natural history, pathogenesis, treatment options, and expected course as well as the relapsing and sometimes recalcitrant nature of the disease.    - Restart humira 80mg  subcutaneous weekly  - Continue clindamycin/rifampin 300mg  BID prn flares.    Bilateral breasts with nipple discharge.  Possible there is a small benign cyst of the R breast present with contents that exits with pressure, but it's a bit of an atypical history and location.  Would be reasonable to see a breast specialist to assure nothing else is going on.    - While it's possible patient has benign cysts, given mammogram results, it is reasonable to be evaluated by a breast specialist and we recommended that she do so  - Patient recently had an MRI and is waiting on the results  - Continue following with PCP for treatment     High risk medication use, humira  - Quantiferon gold negative 08/2021. Reordered lab today.    The patient was advised to call for an appointment should any new, changing, or symptomatic lesions develop.     RTC: Return in about 1 year (around 12/14/2023) for follow up of HS. or sooner as needed   _________________________________________________________________      Chief Complaint     Chief Complaint   Patient presents with    HS      Pt here for follow up HS        HPI     Krista Williamson is a 52 y.o. female who presents as a returning patient (last seen by Dr. Janyth Contes on 09/01/2021) to Dermatology for follow up of hidradenitis suppurativa. At last visit, patient was to continue humira 80mg  qwk and was to continue clindamycin/rifampin 300mg  BID for her hidradenitis suppurativa.    Patient reports that her HS has improved overall, but has been flaring in the last month while off Humira. Notes that her insurance has denied refills of Humira given that it has been over a year since last visit. Endorses flares of her buttocks impairing her ability to sit down.     Additionally, patient notes that she just had an MRI for the cyst of her R breast and is waiting on the results. Endorses drainage of bilateral breasts.      Self-reported severity (0-5): 3  VAS pain today: 4  VAS average pain for the last month: 6  Requiring pain medication? Yes.  If so, what type/frequency? Naproxen   How often in pain?  few times a month  Level of odor (0-5): 3  Level of itching (0-5): 4  Dressing changes needed for drainage:No drainage/less than once weekly  How much drainage: no drainage  Flare in the last month (Y/N)? Yes.  How long ago was the last flare?   Developing new lesions? once a month  Number of inflammatory lesions montly: 3-5  DLQI: 22  Current treatment: see main note     How helpful is the current treatment in managing the following aspects of your disease?  Not at all helpful Somewhat helpful Very helpful   Pain   x     Decreasing length of flares   x     Decreasing new lesions   x     Drainage   x     Decreasing frequency of flares   x  Decreasing severity of flares   x     Odor   x             The patient denies any other new or changing lesions or areas of concern.     Pertinent Past Medical History     HS  Disease course:  Year when symptoms first noticed: 16  Year of diagnosis: 1999  Location of first symptoms: groin and buttocks  Typical involved areas include: groin and buttocks  Typical number of inflammatory lesions each month at baseline (from first visit): 1-3  Disease triggers: none noted     Prior treatments:  Topical: none  Systemic: adalimumab 80mg  weekly (increased spring 2019), clinda/rifampin, Augmentin, Keflex, doxycycline, Bactrim  Past surgical procedures: unroofing procedure in the bilateral inguinal folds in 04/2018 with excellent improvement in those areas.   Past laser procedures: none    Past Medical History, Family History, Social History, Medication List, Allergies, and Problem List were reviewed in the rooming section of Epic.     ROS: Other than symptoms mentioned in the HPI, no fevers, chills, or other skin complaints    Physical Examination     Gen: Well-appearing patient, appropriate, interactive, in no acute distress  SKIN (Focal Skin Exam): Per patient request, examination of bilateral axillae, chest, abdomen, bilateral thighs, groin, buttocks, and external genitalia was performed    location Abscess Inflamed nodule Non-inflamed nodule Draining sinus Non-draining Sinus Hurley % scar   R axilla          L axilla          R inframammary          L inframammary          Intermammary          Pubic          R inguinal  2        R thigh          L inguinal  1        L thigh          Scrotum/Vulva          Perianal          R buttock  1        L buttock  1        Other (list)                      AN count (total sum of abscess and inflammatory nodule): 5  Pilonidal sinus? No    -sites not commented on demonstrate normal findings.    Scribe's Attestation: Elsie Stain, MD obtained and performed the history, physical exam and medical decision making elements that were entered into the chart.  Signed by Donnella Sham, Scribe, on Dec 14, 2022 at 9:03 AM.    ----------------------------------------------------------------------------------------------------------------------  Dec 14, 2022 12:55 PM. Documentation assistance provided by the Scribe. I was present during the time the encounter was recorded. The information recorded by the Scribe was done at my direction and has been reviewed and validated by me.  ----------------------------------------------------------------------------------------------------------------------       (Approved Template 04/15/2020)

## 2022-12-14 ENCOUNTER — Ambulatory Visit: Admit: 2022-12-14 | Discharge: 2022-12-14 | Payer: MEDICARE

## 2022-12-14 DIAGNOSIS — Z79899 Other long term (current) drug therapy: Principal | ICD-10-CM

## 2022-12-14 DIAGNOSIS — L732 Hidradenitis suppurativa: Principal | ICD-10-CM

## 2022-12-14 MED ORDER — HUMIRA(CF) PEN 80 MG/0.8 ML SUBCUTANEOUS KIT
SUBCUTANEOUS | 11 refills | 28.00000 days | Status: CP
Start: 2022-12-14 — End: ?
  Filled 2022-12-23: qty 4, 28d supply, fill #0

## 2022-12-14 NOTE — Unmapped (Signed)
Self-reported severity (0-5): 3  VAS pain today: 4  VAS average pain for the last month: 6  Requiring pain medication? Yes.  If so, what type/frequency? Naproxen   How often in pain?  few times a month  Level of odor (0-5): 3  Level of itching (0-5): 4  Dressing changes needed for drainage:No drainage/less than once weekly  How much drainage: no drainage  Flare in the last month (Y/N)? Yes.  How long ago was the last flare?   Developing new lesions? once a month  Number of inflammatory lesions montly: 3-5  DLQI: 22  Current treatment: see main note     How helpful is the current treatment in managing the following aspects of your disease?  Not at all helpful Somewhat helpful Very helpful   Pain  x    Decreasing length of flares  x    Decreasing new lesions  x    Drainage  x    Decreasing frequency of flares  x    Decreasing severity of flares  x    Odor  x

## 2022-12-16 LAB — QUANTIFERON TB GOLD PLUS
QUANTIFERON ANTIGEN 1 MINUS NIL: 0 [IU]/mL
QUANTIFERON ANTIGEN 2 MINUS NIL: 0.04 [IU]/mL
QUANTIFERON MITOGEN: 9.88 [IU]/mL
QUANTIFERON TB GOLD PLUS: NEGATIVE
QUANTIFERON TB NIL VALUE: 0.12 [IU]/mL

## 2022-12-16 LAB — TB AG1: TB AG1 VALUE: 0.12

## 2022-12-16 LAB — TB NIL: TB NIL VALUE: 0.12

## 2022-12-16 LAB — TB MITOGEN: TB MITOGEN VALUE: 10

## 2022-12-16 LAB — TB AG2: TB AG2 VALUE: 0.16

## 2022-12-21 NOTE — Unmapped (Signed)
[facility] Refill Coordination Note    Specialty Medication(s) to be Shipped:   Inflammatory Disorders: Humira    Other medication(s) to be shipped: No additional medications requested for fill at this time     Krista Williamson, DOB: 1971-05-08  Phone: [phone] (home)       All above HIPAA information was verified with patient.     Was a Nurse, learning disability used for this call? No    Completed refill call assessment today to schedule patient's medication shipment from the [facility] [phone]).  All relevant notes have been reviewed.     Specialty medication(s) and dose(s) confirmed: Regimen is correct and unchanged.   Changes to medications: Krista Williamson reports no changes at this time.  Changes to insurance: No  New side effects reported not previously addressed with a pharmacist or physician: None reported  Questions for the pharmacist: No    Confirmed patient received a Conservation officer, historic buildings and a Surveyor, mining with first shipment. The patient will receive a drug information handout for each medication shipped and additional FDA Medication Guides as required.       DISEASE/MEDICATION-SPECIFIC INFORMATION        For patients on injectable medications: Patient currently has 0 doses left.  Next injection is scheduled for ASAP.    SPECIALTY MEDICATION ADHERENCE     Medication Adherence    Patient reported X missed doses in the last month: >5  Specialty Medication: adalimumab (HUMIRA,CF, PEN) 80 mg/0.8 mL PnKt  Patient is on additional specialty medications: No  Informant: patient   Other non-adherence reason: Patient needed appointment to have a new Rx with refills submitted                 Were doses missed due to medication being on hold? Yes - Refill request sent to provider denied until patient had appointment    Humira 80/0.8 mg/ml: 0 days of medicine on hand       REFERRAL TO PHARMACIST     Referral to the pharmacist: Yes - high priority compliance concerns. Patient has missed more than 3 doses of medication. Refills were scheduled and concern routed (high priority) to pharmacist for evaluation.      SHIPPING     Shipping address confirmed in Epic.     Patient was notified of new phone menu : Yes    Delivery Scheduled: Yes, Expected medication delivery date: 5/22.     Medication will be delivered via UPS to the prescription address in Epic WAM.    Krista Williamson   [facility] Specialty Technician

## 2022-12-22 NOTE — Unmapped (Signed)
Krista Williamson 's Humira shipment will be delayed as a result of Medication OOS.    I have reached out to the patient  at [phone] and communicated the delay. We will reschedule the medication for the delivery date that the patient agreed upon.  We have confirmed the delivery date as 5/23, via ups.

## 2023-01-01 ENCOUNTER — Ambulatory Visit: Payer: 59 | Admitting: Student

## 2023-01-01 ENCOUNTER — Ambulatory Visit: Payer: Self-pay | Admitting: Internal Medicine

## 2023-01-01 NOTE — Progress Notes (Signed)
No show

## 2023-01-19 MED FILL — HUMIRA(CF) PEN 80 MG/0.8 ML SUBCUTANEOUS KIT: SUBCUTANEOUS | 28 days supply | Qty: 4 | Fill #1

## 2023-05-14 ENCOUNTER — Other Ambulatory Visit: Payer: Self-pay | Admitting: Family Medicine

## 2023-05-14 MED ORDER — ALBUTEROL SULFATE HFA 108 (90 BASE) MCG/ACT IN AERS: 2.0000 | INHALATION_SPRAY | RESPIRATORY_TRACT | 0 refills | Status: AC | PRN

## 2023-05-26 NOTE — Unmapped (Signed)
-----   Message from [place] R sent at 05/13/2023 11:54 AM EDT -----  Regarding: FW: received text today to schedule ov  Contact: [phone]    ----- Message -----  From: Dianna Rossetti, LPN  Sent: 16/05/9603  11:43 AM EDT  To: Adonis Housekeeper  Subject: received text today to schedule ov

## 2023-06-08 NOTE — Unmapped (Signed)
Krista Williamson reports some HS flaring since being off Humira for ~3 months. She reports she broke her hand and had to have multiple surgeries and also had a serious infection.     No need for reloading since already on 80mg /week dosing.     [facility] Clinical Assessment & Refill Coordination Note    Krista Williamson, DOB: 1971-04-28  Phone: [phone] (home)     All above HIPAA information was verified with patient.     Was a Nurse, learning disability used for this call? No    Specialty Medication(s):   Inflammatory Disorders: Humira     Current Outpatient Medications   Medication Sig Dispense Refill    adalimumab (HUMIRA,CF, PEN) 80 mg/0.8 mL PnKt Inject the contents of 1 pen (80 mg total) under the skin every seven (7) days. 4 each 11    atorvastatin (LIPITOR) 10 MG tablet Take 1 tablet (10 mg total) by mouth.      clonazePAM (KLONOPIN) 1 MG tablet Take 1 tablet (1 mg total) by mouth daily as needed for anxiety.      HYDROcodone-acetaminophen (NORCO) 5-325 mg per tablet Take 1 tablet by mouth.      ibuprofen (ADVIL,MOTRIN) 200 MG tablet Take 2 tablets (400 mg total) by mouth.      lubiprostone (AMITIZA) 8 MCG capsule Take 1 capsule (8 mcg total) by mouth in the morning and 1 capsule (8 mcg total) in the evening. Take with meals.      naproxen sodium (ALEVE) 220 MG tablet Take 2-3 tablets (440-660 mg total) by mouth.      ofloxacin (OCUFLOX) 0.3 % ophthalmic solution Apply 5 drops in left ear twice daily for 7 days       No current facility-administered medications for this visit.        Changes to medications: Sofya reports no changes at this time.    No Known Allergies    Changes to allergies: No    SPECIALTY MEDICATION ADHERENCE     Humira - 0 left    Medication Adherence    Patient reported X missed doses in the last month: all  Specialty Medication: adalimumab (HUMIRA,CF, PEN) 80 mg/0.8 mL PnKt          Specialty medication(s) dose(s) confirmed: Regimen is correct and unchanged.     Are there any concerns with adherence? Yes: potentially - patient reports didn't respond to pharmacy due to other health issues. Probably some of the time off therapy was appropriate (severe infection, multiple surgeries)    Adherence counseling provided?  Deferred, will get her back on treatment    CLINICAL MANAGEMENT AND INTERVENTION      Clinical Benefit Assessment:    Do you feel the medicine is effective or helping your condition? Yes    Clinical Benefit counseling provided? Not needed    Adverse Effects Assessment:    Are you experiencing any side effects? No    Are you experiencing difficulty administering your medicine? No    Quality of Life Assessment:    Quality of Life    Rheumatology  Oncology  Dermatology  1. What impact has your specialty medication had on the symptoms of your skin condition (i.e. itchiness, soreness, stinging)?: Some  2. What impact has your specialty medication had on your comfort level with your skin?: Some  Cystic Fibrosis          How many days over the past month did your HS  keep you from your  normal activities? For example, brushing your teeth or getting up in the morning. Patient declined to answer    Have you discussed this with your provider? Not needed    Acute Infection Status:    Acute infections noted within Epic:  No active infections  Patient reported infection: None    Therapy Appropriateness:    Is therapy appropriate based on current medication list, adverse reactions, adherence, clinical benefit and progress toward achieving therapeutic goals? Yes, therapy is appropriate and should be continued     DISEASE/MEDICATION-SPECIFIC INFORMATION      For patients on injectable medications: Patient currently has 0 doses left.  Next injection is scheduled for ASAP (overdue).    Chronic Inflammatory Diseases: Have you experienced any flares in the last month? Yes, has been off treatment  Has this been reported to your provider? No    PATIENT SPECIFIC NEEDS     Does the patient have any physical, cognitive, or cultural barriers? No    Is the patient high risk? No    Did the patient require a clinical intervention? No    Does the patient require physician intervention or other additional services (i.e., nutrition, smoking cessation, social work)? No    SOCIAL DETERMINANTS OF HEALTH     At the [facility], we have learned that life circumstances - like trouble affording food, housing, utilities, or transportation can affect the health of many of our patients.   That is why we wanted to ask: are you currently experiencing any life circumstances that are negatively impacting your health and/or quality of life? Patient declined to answer    Social Determinants of Health     Food Insecurity: Food Insecurity Present (03/23/2022)    Received from [facility]    Hunger Vital Sign     Worried About Running Out of Food in the Last Year: Never true     Ran Out of Food in the Last Year: Sometimes true   Internet Connectivity: Not on file   Housing/Utilities: Unknown (04/11/2021)    Housing/Utilities     Within the past 12 months, have you ever stayed: outside, in a car, in a tent, in an overnight shelter, or temporarily in someone else's home (i.e. couch-surfing)?: No     Are you worried about losing your housing?: Not on file     Within the past 12 months, have you been unable to get utilities (heat, electricity) when it was really needed?: Not on file   Tobacco Use: High Risk (10/06/2022)    Received from [facility]    Patient History     Smoking Tobacco Use: Every Day     Smokeless Tobacco Use: Never     Passive Exposure: Not on file   Transportation Needs: No Transportation Needs (07/31/2021)    Received from [facility] - Transportation     Lack of Transportation (Medical): No     Lack of Transportation (Non-Medical): No   Alcohol Use: Not At Risk (07/31/2021)    Received from [facility]    AUDIT-C     Frequency of Alcohol Consumption: Never     Average Number of Drinks: Patient does not drink     Frequency of Binge Drinking: Never   Interpersonal Safety: Unknown (06/08/2023)    Interpersonal Safety     Unsafe Where You Currently Live: Not on file     Physically Hurt by Anyone: Not on file     Abused by Anyone:  Not on file   Physical Activity: Insufficiently Active (07/31/2021)    Received from [facility]    Exercise Vital Sign     Days of Exercise per Week: 3 days     Minutes of Exercise per Session: 30 min   Intimate Partner Violence: Unknown (11/03/2021)    Received from [facility]    HITS     Physically Hurt: Not on file     Insult or Talk Down To: Not on file     Threaten Physical Harm: Not on file     Scream or Curse: Not on file   Stress: No Stress Concern Present (07/31/2021)    Received from [facility] - Occupational Stress Questionnaire     Feeling of Stress : Only a little   Substance Use: Not on file (06/08/2023)   Social Connections: Unknown (12/12/2021)    Received from [facility]    Social Network     Social Network: Not on file   Financial Resource Strain: Medium Risk (07/31/2021)    Received from [facility]    Overall Financial Resource Strain (CARDIA)     Difficulty of Paying Living Expenses: Somewhat hard   Depression: At risk (09/30/2022)    Received from [facility]    PHQ-2     Patient Health Questionnaire-2 Score: 5   Health Literacy: Low Risk  (04/11/2021)    Health Literacy     : Never       Would you be willing to receive help with any of the needs that you have identified today? Not applicable       SHIPPING     Specialty Medication(s) to be Shipped:   Inflammatory Disorders: Humira    Other medication(s) to be shipped: No additional medications requested for fill at this time     Changes to insurance: No    Delivery Scheduled: Yes, Expected medication delivery date: 11/7.     Medication will be delivered via UPS to the confirmed prescription address in [facility].    The patient will receive a drug information handout for each medication shipped and additional FDA Medication Guides as required.  Verified that patient has previously received a Conservation officer, historic buildings and a Surveyor, mining.    The patient or caregiver noted above participated in the development of this care plan and knows that they can request review of or adjustments to the care plan at any time.      All of the patient's questions and concerns have been addressed.    Tiana Sivertson [facility] Specialty Pharmacist

## 2023-06-09 MED FILL — HUMIRA(CF) PEN 80 MG/0.8 ML SUBCUTANEOUS KIT: SUBCUTANEOUS | 28 days supply | Qty: 4 | Fill #2

## 2023-06-22 NOTE — Unmapped (Signed)
[facility] Refill Coordination Note    Specialty Medication(s) to be Shipped:   Inflammatory Disorders: Humira    Other medication(s) to be shipped: No additional medications requested for fill at this time     Krista Williamson, DOB: Jul 01, 1971  Phone: [phone] (home)       All above HIPAA information was verified with patient.     Was a Nurse, learning disability used for this call? No    Completed refill call assessment today to schedule patient's medication shipment from the [facility] [phone]).  All relevant notes have been reviewed.     Specialty medication(s) and dose(s) confirmed: Regimen is correct and unchanged.   Changes to medications: Krista Williamson reports no changes at this time.  Changes to insurance: No  New side effects reported not previously addressed with a pharmacist or physician: None reported  Questions for the pharmacist: No    Confirmed patient received a Conservation officer, historic buildings and a Surveyor, mining with first shipment. The patient will receive a drug information handout for each medication shipped and additional FDA Medication Guides as required.       DISEASE/MEDICATION-SPECIFIC INFORMATION        For patients on injectable medications: Patient currently has 1 doses left.  Next injection is scheduled for 11/20.    SPECIALTY MEDICATION ADHERENCE     Medication Adherence    Patient reported X missed doses in the last month: 0  Specialty Medication: adalimumab (HUMIRA,CF, PEN) 80 mg/0.8 mL PnKt  Patient is on additional specialty medications: No  Informant: patient              Were doses missed due to medication being on hold? No    Humira 80/0.8 mg/ml: 0 days of medicine on hand       REFERRAL TO PHARMACIST     Referral to the pharmacist: Not needed      [facility]     Shipping address confirmed in Epic.     Patient was notified of new phone menu : Yes    Delivery Scheduled: Yes, Expected medication delivery date: 11/27.     Medication will be delivered via UPS to the prescription address in Epic [facility].    Krista Williamson   [facility] Specialty Technician

## 2023-06-29 MED FILL — HUMIRA(CF) PEN 80 MG/0.8 ML SUBCUTANEOUS KIT: SUBCUTANEOUS | 28 days supply | Qty: 4 | Fill #3

## 2023-07-23 NOTE — Unmapped (Unsigned)
error hand   *** *** {Blank:19197::mg,mg/ml,***}: *** {Blank:19197::days,doses} of medicine on hand   *** *** {Blank:19197::mg,mg/ml,***}: *** {Blank:19197::days,doses} of medicine on hand   *** *** {Blank:19197::mg,mg/ml,***}: *** {Blank:19197::days,doses} of medicine on hand   *** *** {Blank:19197::mg,mg/ml,***}: *** {Blank:19197::days,doses} of medicine on hand     REFERRAL TO PHARMACIST     Referral to the pharmacist: {SSCRefertoRPH:[id number]}      SHIPPING     Shipping address confirmed in Epic.       Delivery Scheduled: {Blank:19197::Yes, Expected medication delivery date: ***.,Yes, Expected medication delivery date: ***.  However, Rx request for refills was sent to the provider as there are none remaining.,Patient declined refill at this time due to ***.,No, cannot schedule delivery at this time as there are outstanding items that need addressed.  This note has been handed off to the provider for follow up.,Due to patient insurance changes, unable to fill at [facility], please route Rx to *** specialty pharmacy}     Medication will be delivered via {Blank:19197::UPS,Next Day Courier,Same Day Courier,Clinic Courier - *** clinic,***} to the {Blank:19197::prescription,temporary} address in Epic WAM.    Norva Karvonen   [facility]  Specialty {Blank:19197::Pharmacist,Technician}

## 2023-08-27 NOTE — Unmapped (Signed)
[name]    Specialty Medication(s) to be Shipped:   Inflammatory Disorders: Humira    Other medication(s) to be shipped: No additional medications requested for fill at this time     Krista Williamson, DOB: November 09, 1970  Phone: [phone] (home)       All above HIPAA information was verified with patient.     Was a Nurse, learning disability used for this call? No    Completed refill call assessment today to schedule patient's medication shipment from the [name] [phone]).  All relevant notes have been reviewed.     Specialty medication(s) and dose(s) confirmed: Regimen is correct and unchanged.   Changes to medications: Krista Williamson reports no changes at this time.  Changes to insurance: No  New side effects reported not previously addressed with a pharmacist or physician: None reported  Questions for the pharmacist: No    Confirmed patient received a Conservation officer, historic buildings and a Surveyor, mining with first shipment. The patient will receive a drug information handout for each medication shipped and additional FDA Medication Guides as required.       DISEASE/MEDICATION-SPECIFIC INFORMATION        For patients on injectable medications: Patient currently has 0 doses left.  Next injection is scheduled for 1/30.    SPECIALTY MEDICATION ADHERENCE     Medication Adherence    Patient reported X missed doses in the last month: 0  Specialty Medication: adalimumab (HUMIRA,CF, PEN) 80 mg/0.8 mL PnKt  Patient is on additional specialty medications: No  Informant: patient              Were doses missed due to medication being on hold? No    Humira 80/0.8 mg/ml: 0 days of medicine on hand       REFERRAL TO PHARMACIST     Referral to the pharmacist: Not needed      [name]     Shipping address confirmed in Epic.     Patient was notified of new phone menu : Yes    Delivery Scheduled: Yes, Expected medication delivery date: 1/28.     Medication will be delivered via UPS to the prescription address in Epic WAM.    Krista Williamson   [name] Specialty Technician

## 2023-08-30 MED FILL — HUMIRA(CF) PEN 80 MG/0.8 ML SUBCUTANEOUS KIT: SUBCUTANEOUS | 28 days supply | Qty: 4 | Fill #4

## 2023-09-20 NOTE — Unmapped (Signed)
 The [facility] has made a  second  and final attempt to reach this patient to refill the following medication:Humira.      We have left voicemails on the following phone numbers: [phone], have sent a text message to the following phone numbers: [phone], and have sent a Mychart questionnaire..    Dates contacted: 09/15/2023   09/20/2023  Last scheduled delivery: 08/30/2023    The patient may be at risk of non-compliance with this medication. The patient should call the [facility] at [phone]  Option 4, then Option 2: Dermatology, Gastroenterology, Rheumatology to refill medication.    Krista Williamson

## 2024-01-11 NOTE — Unmapped (Signed)
 Specialty Medication(s): Humira    [name] has been dis-enrolled from the [facility] specialty pharmacy services as a result of multiple unsuccessful outreach attempts by the pharmacy.    Additional information provided to the patient: See Amber's note from 2/17 for attempts.     Carel Carrier [facility] Specialty Pharmacist

## 2024-01-24 DIAGNOSIS — L732 Hidradenitis suppurativa: Principal | ICD-10-CM

## 2024-01-24 DIAGNOSIS — Z79899 Other long term (current) drug therapy: Principal | ICD-10-CM

## 2024-01-24 DIAGNOSIS — N6009 Solitary cyst of unspecified breast: Principal | ICD-10-CM

## 2024-07-26 ENCOUNTER — Other Ambulatory Visit: Payer: Self-pay | Admitting: Family Medicine
